# Patient Record
Sex: Female | Born: 1980
Health system: Southern US, Community
[De-identification: ages and names within clinical notes are randomized; demographics above are authoritative.]

## PROBLEM LIST (undated history)

## (undated) DIAGNOSIS — D649 Anemia, unspecified: Secondary | ICD-10-CM

## (undated) DIAGNOSIS — C801 Malignant (primary) neoplasm, unspecified: Secondary | ICD-10-CM

---

## 2007-07-19 ENCOUNTER — Emergency Department (HOSPITAL_COMMUNITY): Admission: EM | Admit: 2007-07-19 | Discharge: 2007-07-19 | Payer: Self-pay | Admitting: Emergency Medicine

## 2007-09-02 ENCOUNTER — Emergency Department (HOSPITAL_COMMUNITY): Admission: EM | Admit: 2007-09-02 | Discharge: 2007-09-03 | Payer: Self-pay | Admitting: Emergency Medicine

## 2016-12-16 ENCOUNTER — Encounter (HOSPITAL_COMMUNITY): Payer: Self-pay | Admitting: Emergency Medicine

## 2016-12-16 ENCOUNTER — Emergency Department (HOSPITAL_COMMUNITY)
Admission: EM | Admit: 2016-12-16 | Discharge: 2016-12-16 | Disposition: A | Discharge status: Home / Self Care | Payer: Self-pay | Attending: Emergency Medicine | Admitting: Emergency Medicine

## 2016-12-16 DIAGNOSIS — J020 Streptococcal pharyngitis: Secondary | ICD-10-CM | POA: Insufficient documentation

## 2016-12-16 LAB — RAPID STREP SCREEN (MED CTR MEBANE ONLY): Streptococcus, Group A Screen (Direct): POSITIVE — AB

## 2016-12-16 MED ORDER — AZITHROMYCIN 250 MG PO TABS: 500.0000 mg | ORAL_TABLET | Freq: Every day | ORAL | 0 refills | Status: AC

## 2016-12-16 MED ORDER — ACETAMINOPHEN 325 MG PO TABS
650.0000 mg | ORAL_TABLET | Freq: Once | ORAL | Status: AC | PRN
  Administered 2016-12-16: 650 mg via ORAL
  Filled 2016-12-16: qty 2

## 2016-12-16 NOTE — ED Triage Notes (Signed)
Patient complains of fever and sore throat x 2 days.

## 2016-12-16 NOTE — ED Provider Notes (Signed)
[facility] DEPT Provider Note   CSN: [id number] Arrival date & time: 12/16/16  1017     History   Chief Complaint Chief Complaint  Patient presents with  . Fever  . Sore Throat    HPI Cassandra Clark is a 36 y.o. female presents to the ED complaining of sore throat for 2 days. She reports Chills and nausea at home. She denies vomiting, diarrhea, cough. She reports trying Tylenol last night and took Tylenol here in the emergency department with moderate relief. She states she also tried Halls with minimal relief. She reports that her son recently was diagnosed with strep throat by culture.  The history is provided by the patient. No language interpreter was used.  Fever   Associated symptoms include sore throat.  Sore Throat     History reviewed. No pertinent past medical history.  There are no active problems to display for this patient.   History reviewed. No pertinent surgical history.  OB History    No data available       Home Medications    Prior to Admission medications   Medication Sig Start Date End Date Taking? Authorizing Provider  azithromycin (ZITHROMAX) 250 MG tablet Take 2 tablets (500 mg total) by mouth daily. 12/16/16 12/21/16  Bettey Costa, [place]    Family History No family history on file.  Social History Social History  Substance Use Topics  . Smoking status: Not on file  . Smokeless tobacco: Never Used  . Alcohol use No     Allergies   Penicillins   Review of Systems Review of Systems  Constitutional: Positive for fever.  HENT: Positive for sore throat.      Physical Exam Updated Vital Signs BP (!) 100/53 (BP Location: Right Arm)   Pulse 110   Temp 99.4 F (37.4 C) (Oral)   Resp 18   Ht 5\' 7"  (1.702 m)   Wt 59 kg   LMP 12/16/2016   SpO2 99%   BMI 20.36 kg/m   Physical Exam  Constitutional: She is oriented to person, place, and time. She appears well-developed and well-nourished.  Well appearing  HENT:    Head: Normocephalic and atraumatic.  Right Ear: External ear normal.  Left Ear: External ear normal.  Nose: Nose normal.  Mouth/Throat: Oropharynx is clear and moist.   Tonsils with evidence of mild redness, swelling, and exudates. TM's appear normal with no evidence of bulging. EAC appear non erythematous and not swollen  Eyes: EOM are normal. Pupils are equal, round, and reactive to light.  Neck: Normal range of motion.  Normal ROM. No nuchal rigidity.   Cardiovascular: Normal rate and normal heart sounds.   Pulmonary/Chest: Effort normal and breath sounds normal. No respiratory distress. She has no wheezes. She has no rales.  Lungs CTA. No wheezing. No rales. No stridor. Normal work of breathing  Abdominal: Soft. There is no tenderness. There is no rebound and no guarding.  Soft and nontender. No rebound. No guarding. Negative murphy's sign. No focal tenderness at McBurney's point. No CVA tenderness. No evidence of hernia. No hepatosplenomegaly.  Lymphadenopathy:    She has no cervical adenopathy.  Neurological: She is alert and oriented to person, place, and time.  Skin: Skin is warm.  Psychiatric: She has a normal mood and affect. Her behavior is normal.  Nursing note and vitals reviewed.    ED Treatments / Results  Labs (all labs ordered are listed, but only abnormal results are displayed) Labs Reviewed  RAPID STREP SCREEN (NOT AT [facility]) - Abnormal; Notable for the following:       Result Value   Streptococcus, Group A Screen (Direct) POSITIVE (*)    All other components within normal limits    EKG  EKG Interpretation None       Radiology No results found.  Procedures Procedures (including critical care time)  Medications Ordered in ED Medications  acetaminophen (TYLENOL) tablet 650 mg (650 mg Oral Given 12/16/16 1104)     Initial Impression / Assessment and Plan / ED Course  I have reviewed the triage vital signs and the nursing notes.  Pertinent labs &  imaging results that were available during my care of the patient were reviewed by me and considered in my medical decision making (see chart for details).    Pt rapid strep test positive. Pt is tolerating secretions well. She is in no respiratory distress. Presentation not concerning for peritonsillar abscess or spread of infection to deep spaces of the throat; patent airway. Pt will be discharged with Azithromycin, since she is reportedly allergic to penicillin.  Specific return precautions discussed. Recommended PCP follow up in one week as needed. Pt appears safe for discharge.    Final Clinical Impressions(s) / ED Diagnoses   Final diagnoses:  Strep pharyngitis    New Prescriptions New Prescriptions   AZITHROMYCIN (ZITHROMAX) 250 MG TABLET    Take 2 tablets (500 mg total) by mouth daily.     [place], [place] 12/16/16 [place], MD 12/18/16 [phone]

## 2016-12-16 NOTE — Discharge Instructions (Signed)
Please take azithromycin 500 mg daily for 5 days. Use warm salt water rinses throughout the day. Wash her hands and drink plenty of fluids throughout the day.  Get help right away if: You have new symptoms, such as vomiting, severe headache, stiff or painful neck, chest pain, or shortness of breath. You have severe throat pain, drooling, or changes in your voice. You have swelling of the neck, or the skin on the neck becomes red and tender. You have signs of dehydration, such as fatigue, dry mouth, and decreased urination. You become increasingly sleepy, or you cannot wake up completely. Your joints become red or painful.

## 2018-07-04 ENCOUNTER — Emergency Department (HOSPITAL_COMMUNITY): Payer: Self-pay

## 2018-07-04 ENCOUNTER — Encounter (HOSPITAL_COMMUNITY): Payer: Self-pay

## 2018-07-04 ENCOUNTER — Emergency Department (HOSPITAL_COMMUNITY)
Admission: EM | Admit: 2018-07-04 | Discharge: 2018-07-04 | Disposition: A | Discharge status: Home / Self Care | Payer: Self-pay | Attending: Emergency Medicine | Admitting: Emergency Medicine

## 2018-07-04 DIAGNOSIS — G8929 Other chronic pain: Secondary | ICD-10-CM

## 2018-07-04 DIAGNOSIS — D649 Anemia, unspecified: Secondary | ICD-10-CM

## 2018-07-04 DIAGNOSIS — R51 Headache: Secondary | ICD-10-CM | POA: Insufficient documentation

## 2018-07-04 DIAGNOSIS — Z79899 Other long term (current) drug therapy: Secondary | ICD-10-CM | POA: Insufficient documentation

## 2018-07-04 LAB — BASIC METABOLIC PANEL
Anion gap: 7 (ref 5–15)
BUN: 12 mg/dL (ref 6–20)
CHLORIDE: 103 mmol/L (ref 98–111)
CO2: 27 mmol/L (ref 22–32)
Calcium: 8.5 mg/dL — ABNORMAL LOW (ref 8.9–10.3)
Creatinine, Ser: 0.81 mg/dL (ref 0.44–1.00)
GFR calc Af Amer: >60 mL/min (ref >60)
GFR calc non Af Amer: >60 mL/min (ref >60)
GLUCOSE: 120 mg/dL — AB (ref 70–99)
Potassium: 3.6 mmol/L (ref 3.5–5.1)
SODIUM: 137 mmol/L (ref 135–145)

## 2018-07-04 LAB — CBC WITH DIFFERENTIAL/PLATELET
Basophils Absolute: 0 10*3/uL (ref 0.0–0.1)
Basophils Relative: 0 %
EOS ABS: 0.1 10*3/uL (ref 0.0–0.7)
Eosinophils Relative: 1 %
HEMATOCRIT: 32.3 % — AB (ref 36.0–46.0)
HEMOGLOBIN: 9.8 g/dL — AB (ref 12.0–15.0)
LYMPHS ABS: 0.6 10*3/uL — AB (ref 0.7–4.0)
LYMPHS PCT: 13 %
MCH: 23.8 pg — AB (ref 26.0–34.0)
MCHC: 30.3 g/dL (ref 30.0–36.0)
MCV: 78.4 fL (ref 78.0–100.0)
MONOS PCT: 8 %
Monocytes Absolute: 0.3 10*3/uL (ref 0.1–1.0)
NEUTROS PCT: 78 %
Neutro Abs: 3.6 10*3/uL (ref 1.7–7.7)
Platelets: 433 10*3/uL — ABNORMAL HIGH (ref 150–400)
RBC: 4.12 MIL/uL (ref 3.87–5.11)
RDW: 15.6 % — ABNORMAL HIGH (ref 11.5–15.5)
WBC: 4.6 10*3/uL (ref 4.0–10.5)

## 2018-07-04 LAB — PREGNANCY, URINE: PREG TEST UR: NEGATIVE

## 2018-07-04 MED ORDER — FERROUS SULFATE 325 (65 FE) MG PO TABS: 325.0000 mg | ORAL_TABLET | Freq: Every day | ORAL | 0 refills | Status: DC

## 2018-07-04 NOTE — Discharge Instructions (Signed)
Follow-up with a primary care doctor. °

## 2018-07-04 NOTE — ED Triage Notes (Signed)
Pt reports HA on left side for several months intermittently, now daily for 2 weeks Pain feels like pressure . Reports nausea and recent cough

## 2018-07-04 NOTE — ED Provider Notes (Signed)
[facility] EMERGENCY DEPARTMENT Provider Note   CSN: [id number] Arrival date & time: 07/04/18  1505     History   Chief Complaint Chief Complaint  Patient presents with  . Headache    HPI Cassandra Clark is a 37 y.o. female.  HPI Patient presents with headache.  Has had for the last 2 to 3 months.  Dull.  Sometimes on the left side but has been on different parts of the head to.  Throbbing.  Comes and goes.  Has had nausea.  Occasional cough.  States she sometimes will get chills.  Motrin tends to help the headache. Also feels lightheaded at times.  Had a history of anemia when she was a kid.  Denies heavy periods.  Does not think she is pregnant. No abdominal pain.  Has had some nausea.  No weight change History reviewed. No pertinent past medical history.  There are no active problems to display for this patient.   History reviewed. No pertinent surgical history.   OB History   None      Home Medications    Prior to Admission medications   Medication Sig Start Date End Date Taking? Authorizing Provider  ibuprofen (ADVIL,MOTRIN) 200 MG tablet Take 800 mg by mouth every 6 (six) hours as needed for moderate pain.   Yes [provider]  naproxen sodium (ALEVE) 220 MG tablet Take 220 mg by mouth 2 (two) times daily as needed (pain).   Yes [provider]  ferrous sulfate 325 (65 FE) MG tablet Take 1 tablet (325 mg total) by mouth daily. 07/04/18   Davonna Belling, MD    Family History No family history on file.  Social History Social History   Tobacco Use  . Smoking status: Never Smoker  . Smokeless tobacco: Never Used  Substance Use Topics  . Alcohol use: No  . Drug use: No     Allergies   Penicillins   Review of Systems Review of Systems  Constitutional: Positive for chills. Negative for appetite change.  HENT: Negative for congestion.   Respiratory: Negative for shortness of breath.   Cardiovascular: Negative for chest pain.    Gastrointestinal: Negative for abdominal pain.  Genitourinary: Negative for flank pain.  Musculoskeletal: Negative for back pain.  Skin: Negative for rash.  Neurological: Positive for light-headedness and headaches.  Psychiatric/Behavioral: Negative for confusion.     Physical Exam Updated Vital Signs BP 113/64   Pulse (!) 115   Temp 99.8 F (37.7 C) (Oral)   Resp 14   Ht 5\' 7"  (1.702 m)   Wt 72.6 kg   LMP 06/27/2018   SpO2 100%   BMI 25.06 kg/m   Physical Exam  Constitutional: She appears well-developed.  HENT:  Head: Normocephalic.  Eyes: EOM are normal.  Neck: Neck supple.  Cardiovascular:  Tachycardia   Abdominal: Soft. There is no tenderness.  Musculoskeletal: Normal range of motion.  Neurological: She is alert.  Skin: Skin is dry.     ED Treatments / Results  Labs (all labs ordered are listed, but only abnormal results are displayed) Labs Reviewed  CBC WITH DIFFERENTIAL/PLATELET - Abnormal; Notable for the following components:      Result Value   Hemoglobin 9.8 (*)    HCT 32.3 (*)    MCH 23.8 (*)    RDW 15.6 (*)    Platelets 433 (*)    Lymphs Abs 0.6 (*)    All other components within normal limits  BASIC METABOLIC PANEL -  Abnormal; Notable for the following components:   Glucose, Bld 120 (*)    Calcium 8.5 (*)    All other components within normal limits  PREGNANCY, URINE    EKG EKG Interpretation  Date/Time:  Sunday July 04 2018 15:59:58 EDT Ventricular Rate:  119 PR Interval:    QRS Duration: 64 QT Interval:  276 QTC Calculation: 389 R Axis:   74 Text Interpretation:  Sinus tachycardia Confirmed by Gillermo Poch ([id number]) on 07/04/2018 4:19:54 PM   Radiology Ct Head Wo Contrast  Result Date: 07/04/2018 CLINICAL DATA:  Left-sided headache. EXAM: CT HEAD WITHOUT CONTRAST TECHNIQUE: Contiguous axial images were obtained from the base of the skull through the vertex without intravenous contrast. COMPARISON:  None. FINDINGS: Brain:  No evidence of acute infarction, hemorrhage, hydrocephalus, extra-axial collection or mass lesion/mass effect. Vascular: No hyperdense vessel or unexpected calcification. Skull: Normal. Negative for fracture or focal lesion. Sinuses/Orbits: No acute finding. Other: None. IMPRESSION: No acute intracranial abnormality. Electronically Signed   By: Dobrinka  Dimitrova M.D.   On: 07/04/2018 17:05    Procedures Procedures (including critical care time)  Medications Ordered in ED Medications - No data to display   Initial Impression / Assessment and Plan / ED Course  I have reviewed the triage vital signs and the nursing notes.  Pertinent labs & imaging results that were available during my care of the patient were reviewed by me and considered in my medical decision making (see chart for details).    Patient with headache.  Does not typically get headaches.  CT reassuring.  Also some mild anemia.  Will start on iron and will need outpatient follow-up.  Mild tachycardia but EKG reassuring Final Clinical Impressions(s) / ED Diagnoses   Final diagnoses:  Chronic nonintractable headache, unspecified headache type  Anemia, unspecified type    ED Discharge Orders         Ordered    ferrous sulfate 325 (65 FE) MG tablet  Daily     09 /08/19 1801           Davonna Belling, MD 07/04/18 2321

## 2018-08-03 ENCOUNTER — Inpatient Hospital Stay (HOSPITAL_COMMUNITY)
Admission: EM | Admit: 2018-08-03 | Discharge: 2018-08-06 | DRG: 841 | Disposition: A | Discharge status: Home / Self Care | Payer: Medicaid Other | Attending: Internal Medicine | Admitting: Internal Medicine

## 2018-08-03 ENCOUNTER — Encounter (HOSPITAL_COMMUNITY): Payer: Self-pay | Admitting: *Deleted

## 2018-08-03 ENCOUNTER — Other Ambulatory Visit: Payer: Self-pay

## 2018-08-03 ENCOUNTER — Emergency Department (HOSPITAL_COMMUNITY): Payer: Medicaid Other

## 2018-08-03 DIAGNOSIS — R5383 Other fatigue: Secondary | ICD-10-CM

## 2018-08-03 DIAGNOSIS — D473 Essential (hemorrhagic) thrombocythemia: Secondary | ICD-10-CM | POA: Diagnosis present

## 2018-08-03 DIAGNOSIS — R591 Generalized enlarged lymph nodes: Secondary | ICD-10-CM

## 2018-08-03 DIAGNOSIS — D649 Anemia, unspecified: Secondary | ICD-10-CM | POA: Diagnosis present

## 2018-08-03 DIAGNOSIS — E538 Deficiency of other specified B group vitamins: Secondary | ICD-10-CM

## 2018-08-03 DIAGNOSIS — J9859 Other diseases of mediastinum, not elsewhere classified: Secondary | ICD-10-CM

## 2018-08-03 DIAGNOSIS — C8190 Hodgkin lymphoma, unspecified, unspecified site: Principal | ICD-10-CM | POA: Diagnosis present

## 2018-08-03 DIAGNOSIS — R222 Localized swelling, mass and lump, trunk: Secondary | ICD-10-CM | POA: Diagnosis present

## 2018-08-03 DIAGNOSIS — C8198 Hodgkin lymphoma, unspecified, lymph nodes of multiple sites: Secondary | ICD-10-CM

## 2018-08-03 DIAGNOSIS — D72819 Decreased white blood cell count, unspecified: Secondary | ICD-10-CM | POA: Diagnosis present

## 2018-08-03 DIAGNOSIS — C8598 Non-Hodgkin lymphoma, unspecified, lymph nodes of multiple sites: Secondary | ICD-10-CM

## 2018-08-03 DIAGNOSIS — D630 Anemia in neoplastic disease: Secondary | ICD-10-CM | POA: Diagnosis present

## 2018-08-03 DIAGNOSIS — R739 Hyperglycemia, unspecified: Secondary | ICD-10-CM

## 2018-08-03 DIAGNOSIS — I871 Compression of vein: Secondary | ICD-10-CM | POA: Diagnosis present

## 2018-08-03 DIAGNOSIS — D279 Benign neoplasm of unspecified ovary: Secondary | ICD-10-CM | POA: Diagnosis present

## 2018-08-03 DIAGNOSIS — Z880 Allergy status to penicillin: Secondary | ICD-10-CM

## 2018-08-03 HISTORY — DX: Anemia, unspecified: D64.9

## 2018-08-03 LAB — CBC WITH DIFFERENTIAL/PLATELET
Abs Immature Granulocytes: 0.01 10*3/uL (ref 0.00–0.07)
Basophils Absolute: 0 10*3/uL (ref 0.0–0.1)
Basophils Relative: 0 %
EOS ABS: 0 10*3/uL (ref 0.0–0.5)
EOS PCT: 1 %
HEMATOCRIT: 29.3 % — AB (ref 36.0–46.0)
Hemoglobin: 8.6 g/dL — ABNORMAL LOW (ref 12.0–15.0)
Immature Granulocytes: 0 %
LYMPHS ABS: 0.4 10*3/uL — AB (ref 0.7–4.0)
Lymphocytes Relative: 11 %
MCH: 22.8 pg — AB (ref 26.0–34.0)
MCHC: 29.4 g/dL — AB (ref 30.0–36.0)
MCV: 77.7 fL — AB (ref 80.0–100.0)
MONO ABS: 0.6 10*3/uL (ref 0.1–1.0)
MONOS PCT: 18 %
Neutro Abs: 2.5 10*3/uL (ref 1.7–7.7)
Neutrophils Relative %: 70 %
Platelets: 451 10*3/uL — ABNORMAL HIGH (ref 150–400)
RBC: 3.77 MIL/uL — ABNORMAL LOW (ref 3.87–5.11)
RDW: 17.9 % — ABNORMAL HIGH (ref 11.5–15.5)
WBC: 3.6 10*3/uL — ABNORMAL LOW (ref 4.0–10.5)
nRBC: 0 % (ref 0.0–0.2)

## 2018-08-03 LAB — URINALYSIS, ROUTINE W REFLEX MICROSCOPIC
Bilirubin Urine: NEGATIVE
Glucose, UA: NEGATIVE mg/dL
KETONES UR: NEGATIVE mg/dL
Nitrite: NEGATIVE
PROTEIN: NEGATIVE mg/dL
Specific Gravity, Urine: 1.02 (ref 1.005–1.030)
pH: 7 (ref 5.0–8.0)

## 2018-08-03 LAB — POC URINE PREG, ED: PREG TEST UR: NEGATIVE

## 2018-08-03 LAB — INFLUENZA PANEL BY PCR (TYPE A & B)
INFLAPCR: NEGATIVE
Influenza B By PCR: NEGATIVE

## 2018-08-03 MED ORDER — ACETAMINOPHEN 325 MG PO TABS
650.0000 mg | ORAL_TABLET | Freq: Once | ORAL | Status: AC | PRN
  Administered 2018-08-03: 650 mg via ORAL
  Filled 2018-08-03: qty 2

## 2018-08-03 NOTE — ED Notes (Signed)
Pt returned from xray,  

## 2018-08-03 NOTE — ED Notes (Signed)
ED Provider at bedside. 

## 2018-08-03 NOTE — ED Provider Notes (Signed)
[facility] EMERGENCY DEPARTMENT Provider Note   CSN: [id number] Arrival date & time: 08/03/18  2131     History   Chief Complaint Chief Complaint  Patient presents with  . Fever    HPI Cassandra Clark is a 37 y.o. female with a recent several month history of chronic daily headache but now resolved x 3 days and a recent diagnosis of anemia, on iron supplementation presenting with a fever to 102 since yesterday although she reports nightly night sweats for the past several weeks so suspects she has had subjective fevers for weeks.  Also with a mild non productive cough. She denies current headache as mentioned, also no neck pain or stiffness, no sob, cp, abdominal pain, n/v/d, no dysuria or vaginal discharge.  Also denies rash or insect bites.  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  She has had no treatment prior to arrival.   The history is provided by the patient.    Past Medical History:  Diagnosis Date  . Anemia     There are no active problems to display for this patient.   History reviewed. No pertinent surgical history.   OB History   None      Home Medications    Prior to Admission medications   Medication Sig Start Date End Date Taking? Authorizing Provider  ibuprofen (ADVIL,MOTRIN) 200 MG tablet Take 800 mg by mouth every 6 (six) hours as needed for moderate pain.   Yes [provider]  naproxen sodium (ALEVE) 220 MG tablet Take 220 mg by mouth 2 (two) times daily as needed (pain).   Yes [provider]  ferrous sulfate 325 (65 FE) MG tablet Take 1 tablet (325 mg total) by mouth daily. Patient not taking: Reported on 08/03/2018 07/04/18   Davonna Belling, MD    Family History No family history on file.  Social History Social History   Tobacco Use  . Smoking status: Never Smoker  . Smokeless tobacco: Never Used  Substance Use Topics  . Alcohol use: No  . Drug use: No     Allergies    Penicillins   Review of Systems Review of Systems  Constitutional: Positive for fever.  HENT: Negative for congestion, sinus pressure and sore throat.   Eyes: Negative.   Respiratory: Positive for cough. Negative for chest tightness and shortness of breath.   Cardiovascular: Negative for chest pain.  Gastrointestinal: Negative for abdominal pain, constipation, nausea and vomiting.  Genitourinary: Negative.  Negative for dysuria and vaginal discharge.  Musculoskeletal: Negative for arthralgias, joint swelling and neck pain.  Skin: Negative.  Negative for rash and wound.  Neurological: Negative for dizziness, weakness, light-headedness and numbness.  Psychiatric/Behavioral: Negative.      Physical Exam Updated Vital Signs BP 114/76   Pulse 100   Temp 99.1 F (37.3 C) (Oral)   Resp 20   Ht 5\' 7"  (1.702 m)   Wt 72.6 kg   LMP 07/26/2018   SpO2 100%   BMI 25.06 kg/m   Physical Exam  Constitutional: She appears well-developed and well-nourished.  HENT:  Head: Normocephalic and atraumatic.  Eyes: Conjunctivae are normal.  Neck: Normal range of motion. No neck rigidity.  Cardiovascular: Normal rate, regular rhythm, normal heart sounds and intact distal pulses.  Pulmonary/Chest: Effort normal and breath sounds normal. She has no wheezes.  Abdominal: Soft. Bowel sounds are normal. There is no tenderness.  Musculoskeletal: Normal range of motion.  Lymphadenopathy:  She has no cervical adenopathy.  Neurological: She is alert.  Skin: Skin is warm and dry. No rash noted.  Psychiatric: She has a normal mood and affect.  Nursing note and vitals reviewed.    ED Treatments / Results  Labs (all labs ordered are listed, but only abnormal results are displayed) Labs Reviewed  URINALYSIS, ROUTINE W REFLEX MICROSCOPIC - Abnormal; Notable for the following components:      Result Value   APPearance HAZY (*)    Hgb urine dipstick SMALL (*)    Leukocytes, UA TRACE (*)     Bacteria, UA RARE (*)    All other components within normal limits  CBC WITH DIFFERENTIAL/PLATELET - Abnormal; Notable for the following components:   WBC 3.6 (*)    RBC 3.77 (*)    Hemoglobin 8.6 (*)    HCT 29.3 (*)    MCV 77.7 (*)    MCH 22.8 (*)    MCHC 29.4 (*)    RDW 17.9 (*)    Platelets 451 (*)    Lymphs Abs 0.4 (*)    All other components within normal limits  COMPREHENSIVE METABOLIC PANEL - Abnormal; Notable for the following components:   Glucose, Bld 107 (*)    Calcium 8.4 (*)    Albumin 3.2 (*)    All other components within normal limits  INFLUENZA PANEL BY PCR (TYPE A & B)  POC URINE PREG, ED    EKG None  Radiology Dg Chest 2 View  Result Date: 08/03/2018 CLINICAL DATA:  Fever with cough for 1 week. Nausea and vomiting. EXAM: CHEST - 2 VIEW COMPARISON:  None. FINDINGS: Normal heart size and pulmonary vascularity. Right paratracheal and left AP window mediastinal masses likely representing prominent lymphadenopathy. Consider lymphoma. CT chest suggested for further characterization. Lungs are clear and expanded. No blunting of costophrenic angles. No pneumothorax. IMPRESSION: Right paratracheal and left AP window masses likely representing lymphadenopathy. Consider CT chest for further characterization. Electronically Signed   By: Lucienne Capers M.D.   On: 08/03/2018 23:21   Ct Chest W Contrast  Result Date: 08/04/2018 CLINICAL DATA:  Mediastinal mass on chest radiography, for further characterization. EXAM: CT CHEST WITH CONTRAST TECHNIQUE: Multidetector CT imaging of the chest was performed during intravenous contrast administration. CONTRAST:  24mL OMNIPAQUE IOHEXOL 300 MG/ML  SOLN COMPARISON:  08/03/2018 FINDINGS: Cardiovascular: There is considerable extrinsic mass effect on the superior vena cava by the mediastinal mass, lowering the cross-sectional area to about 0.55 cm^2 on image 32/2, with some irregularity along the caval margin which could conceivably  represent invasion of the cava or a small amount of marginal thrombus. No large acute pulmonary embolus although today's exam was not timed or performed to assess specifically for pulmonary embolus. There is narrowing of the left upper lobe pulmonary artery due to the mass. Mediastinum/Nodes: Confluent and irregular mediastinal mass also extending into the left hilum and questionably involving the periphery of the left lung/pleural margin. A representative measurement on image 48/2 measures 10.3 by 6.0 cm. The mass extends around much of the aortic arch and portions the branch vasculature and abuts the left pulmonary artery. Extension in the anterior mediastinum has mass effect on the SVC is noted above. Both internal mammary arteries course through this tumor. The tumor involves the AP window. Left infrahilar node 1.2 cm in short axis on image 61/2. Pathologic supraclavicular adenopathy including a 1.2 cm node on image 9/2. A lower neck level IV lymph node measures 1.4 cm in  short axis on image 1/2. Right supraclavicular node 1.0 cm in short axis, image 9/2. Pathologic left axillary and subpectoral adenopathy, index axillary node 2.2 cm in short axis on image 18/2. An upper mediastinal node below the thyroid gland measures 1.9 cm in short axis on image 16/2. Lungs/Pleura: Tumor extends around the left upper lobe tracheobronchial tree centrally. A lung nodule in the left upper lobe medially measures 1.0 by 1.1 cm on image 38/4. Tumor tracks along the pleural space below the left first rib as shown on image 22/2, measuring about 1.7 cm anterior-posterior. Upper Abdomen: Nodular heterogeneous enhancement in the spleen favoring tumor involvement. An index lesion measures 2.5 by 1.9 cm on image 122/2, but multiple splenic nodules are present. There is likely a lymph node in the splenic hilum on image 133/2, the bottom most image. Porta hepatis adenopathy identified including a 1.3 cm peripancreatic node on image 132/2  and a portacaval node measuring 1.5 cm in short axis on image 133/2. A lymph node between the right hemidiaphragmatic crus and the IVC measures 1.2 cm in short axis also on image 132. Hypodense lesion medially in the right hepatic lobe on image 127/2 measures 1.6 by 1.3 cm. Questionable hypodense lesion peripherally in the right hepatic lobe about 0.9 cm in diameter on image 116/2. Musculoskeletal: Permeative destruction of the sternal manubrium, with tumor and/or hematoma along the anterior and posterior manubrial margins. The tumor rind anterior to the manubrium measures up to about 1.4 cm in thickness on image 97/6. IMPRESSION: 1. 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 In addition there are multiple nodular lesions in the spleen and possibly in the liver, as well as permeative destruction of the sternal manubrium with tumor rind anterior and posterior to the sternum. 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 Left-sided small cell lung cancer might give a similar appearance. Aggressive germ-cell tumor or thymic carcinoma or less likely differential diagnostic considerations. Tissue diagnosis recommended. 2. The anterior mediastinal mass is causing narrowing of the SVC, and mild invasion of the SVC with slight marginal thrombosis is not readily excluded. No large pulmonary embolus is identified. Electronically Signed   By: Van Clines M.D.   On: 08/04/2018 01:22    Procedures Procedures (including critical care time)  Medications Ordered in ED Medications  acetaminophen (TYLENOL) tablet 650 mg (650 mg Oral Given 08/03/18 2156)  sodium chloride 0.9 % bolus 1,000 mL (0 mLs Intravenous Stopped 08/04/18 0202)  iohexol (OMNIPAQUE) 300 MG/ML solution 75 mL (75 mLs Intravenous Contrast Given 08/04/18 0043)     Initial Impression /  Assessment and Plan / ED Course  I have reviewed the triage vital signs and the nursing notes.  Pertinent labs & imaging results that were available during my care of the patient were reviewed by me and considered in my medical decision making (see chart for details).     Imaging and labs reviewed and discussed with patient and was also seen by Dr. Tomi Bamberger. Call placed to Dr. Delton Coombes, awaiting call back to determine need for admission tonight or close outpt f/u.  Suspect pt will need urgent radiation for debulking purposes given the compromise with the SVC.  Call also placed with the hospitalist group.  Dr. Tomi Bamberger will arrange pt's dispo.  Final Clinical Impressions(s) / ED Diagnoses   Final diagnoses:  Mediastinal mass  Lymphadenopathy    ED Discharge  Orders    None       Evalee Jefferson, PA-C 08/04/18 5041    Rolland Porter, MD 08/04/18 [phone]

## 2018-08-03 NOTE — ED Triage Notes (Addendum)
Pt c/o fever, nausea, cough that started a few weeks ago, pt reports that she was seen in the er on 07/04/2018 and was told that she had migraines and anemia. Pt reports that she has had episodes of sweating after taking ibuprofen for the headaches and thought that is might be related to a fever, denies any headaches for the past 3 days but reported a fever of 102.9 at work last night. Pt fever 102.4 in triage, denies any tylenol or ibuprofen use in the past 4 days,

## 2018-08-03 NOTE — ED Notes (Signed)
Pt and family updated on plan of care,  

## 2018-08-04 ENCOUNTER — Ambulatory Visit
Admit: 2018-08-04 | Discharge: 2018-08-04 | Disposition: A | Discharge status: Home / Self Care | Payer: Self-pay | Attending: Radiation Oncology | Admitting: Radiation Oncology

## 2018-08-04 ENCOUNTER — Inpatient Hospital Stay (HOSPITAL_COMMUNITY): Payer: Medicaid Other

## 2018-08-04 ENCOUNTER — Encounter (HOSPITAL_COMMUNITY): Payer: Self-pay | Admitting: Internal Medicine

## 2018-08-04 ENCOUNTER — Emergency Department (HOSPITAL_COMMUNITY): Payer: Medicaid Other

## 2018-08-04 DIAGNOSIS — E538 Deficiency of other specified B group vitamins: Secondary | ICD-10-CM | POA: Diagnosis present

## 2018-08-04 DIAGNOSIS — R591 Generalized enlarged lymph nodes: Secondary | ICD-10-CM | POA: Diagnosis present

## 2018-08-04 DIAGNOSIS — R222 Localized swelling, mass and lump, trunk: Secondary | ICD-10-CM

## 2018-08-04 DIAGNOSIS — I871 Compression of vein: Secondary | ICD-10-CM

## 2018-08-04 DIAGNOSIS — R739 Hyperglycemia, unspecified: Secondary | ICD-10-CM | POA: Diagnosis present

## 2018-08-04 DIAGNOSIS — C8190 Hodgkin lymphoma, unspecified, unspecified site: Secondary | ICD-10-CM | POA: Diagnosis present

## 2018-08-04 DIAGNOSIS — D630 Anemia in neoplastic disease: Secondary | ICD-10-CM | POA: Diagnosis present

## 2018-08-04 DIAGNOSIS — K7689 Other specified diseases of liver: Secondary | ICD-10-CM

## 2018-08-04 DIAGNOSIS — D72819 Decreased white blood cell count, unspecified: Secondary | ICD-10-CM | POA: Diagnosis present

## 2018-08-04 DIAGNOSIS — J9859 Other diseases of mediastinum, not elsewhere classified: Secondary | ICD-10-CM

## 2018-08-04 DIAGNOSIS — C8598 Non-Hodgkin lymphoma, unspecified, lymph nodes of multiple sites: Secondary | ICD-10-CM

## 2018-08-04 DIAGNOSIS — D739 Disease of spleen, unspecified: Secondary | ICD-10-CM

## 2018-08-04 DIAGNOSIS — Z880 Allergy status to penicillin: Secondary | ICD-10-CM | POA: Diagnosis not present

## 2018-08-04 DIAGNOSIS — D649 Anemia, unspecified: Secondary | ICD-10-CM | POA: Diagnosis present

## 2018-08-04 DIAGNOSIS — D279 Benign neoplasm of unspecified ovary: Secondary | ICD-10-CM | POA: Diagnosis present

## 2018-08-04 DIAGNOSIS — D473 Essential (hemorrhagic) thrombocythemia: Secondary | ICD-10-CM | POA: Diagnosis present

## 2018-08-04 LAB — COMPREHENSIVE METABOLIC PANEL
ALK PHOS: 61 U/L (ref 38–126)
ALT: 13 U/L (ref 0–44)
ALT: 11 U/L (ref 0–44)
ANION GAP: 8 (ref 5–15)
ANION GAP: 8 (ref 5–15)
AST: 17 U/L (ref 15–41)
AST: 15 U/L (ref 15–41)
Albumin: 3.2 g/dL — ABNORMAL LOW (ref 3.5–5.0)
Albumin: 2.7 g/dL — ABNORMAL LOW (ref 3.5–5.0)
Alkaline Phosphatase: 51 U/L (ref 38–126)
BILIRUBIN TOTAL: 0.6 mg/dL (ref 0.3–1.2)
BUN: 9 mg/dL (ref 6–20)
BUN: 8 mg/dL (ref 6–20)
CALCIUM: 8.4 mg/dL — AB (ref 8.9–10.3)
CO2: 25 mmol/L (ref 22–32)
CO2: 26 mmol/L (ref 22–32)
CREATININE: 0.69 mg/dL (ref 0.44–1.00)
Calcium: 8 mg/dL — ABNORMAL LOW (ref 8.9–10.3)
Chloride: 103 mmol/L (ref 98–111)
Chloride: 106 mmol/L (ref 98–111)
Creatinine, Ser: 0.81 mg/dL (ref 0.44–1.00)
GLUCOSE: 95 mg/dL (ref 70–99)
Glucose, Bld: 107 mg/dL — ABNORMAL HIGH (ref 70–99)
Potassium: 3.7 mmol/L (ref 3.5–5.1)
Potassium: 3.5 mmol/L (ref 3.5–5.1)
Sodium: 136 mmol/L (ref 135–145)
Sodium: 140 mmol/L (ref 135–145)
TOTAL PROTEIN: 7.5 g/dL (ref 6.5–8.1)
TOTAL PROTEIN: 6.8 g/dL (ref 6.5–8.1)
Total Bilirubin: 0.6 mg/dL (ref 0.3–1.2)

## 2018-08-04 LAB — PHOSPHORUS
PHOSPHORUS: 2 mg/dL — AB (ref 2.5–4.6)
Phosphorus: 2.7 mg/dL (ref 2.5–4.6)

## 2018-08-04 LAB — CBC WITH DIFFERENTIAL/PLATELET
ABS IMMATURE GRANULOCYTES: 0.01 10*3/uL (ref 0.00–0.07)
BASOS ABS: 0 10*3/uL (ref 0.0–0.1)
BASOS PCT: 0 %
EOS ABS: 0 10*3/uL (ref 0.0–0.5)
Eosinophils Relative: 1 %
HCT: 28.7 % — ABNORMAL LOW (ref 36.0–46.0)
Hemoglobin: 8.3 g/dL — ABNORMAL LOW (ref 12.0–15.0)
Immature Granulocytes: 0 %
Lymphocytes Relative: 12 %
Lymphs Abs: 0.3 10*3/uL — ABNORMAL LOW (ref 0.7–4.0)
MCH: 22.8 pg — ABNORMAL LOW (ref 26.0–34.0)
MCHC: 28.9 g/dL — ABNORMAL LOW (ref 30.0–36.0)
MCV: 78.8 fL — AB (ref 80.0–100.0)
MONOS PCT: 22 %
Monocytes Absolute: 0.6 10*3/uL (ref 0.1–1.0)
NEUTROS ABS: 1.9 10*3/uL (ref 1.7–7.7)
NRBC: 0 % (ref 0.0–0.2)
Neutrophils Relative %: 65 %
PLATELETS: 459 10*3/uL — AB (ref 150–400)
RBC: 3.64 MIL/uL — ABNORMAL LOW (ref 3.87–5.11)
RDW: 18.1 % — AB (ref 11.5–15.5)
WBC: 3 10*3/uL — ABNORMAL LOW (ref 4.0–10.5)

## 2018-08-04 LAB — MAGNESIUM
Magnesium: 1.9 mg/dL (ref 1.7–2.4)
Magnesium: 2 mg/dL (ref 1.7–2.4)

## 2018-08-04 LAB — PROTIME-INR
INR: 1.34
PROTHROMBIN TIME: 16.4 s — AB (ref 11.4–15.2)

## 2018-08-04 LAB — APTT: APTT: 45 s — AB (ref 24–36)

## 2018-08-04 LAB — IRON AND TIBC
Iron: 23 ug/dL — ABNORMAL LOW (ref 28–170)
SATURATION RATIOS: 12 % (ref 10.4–31.8)
TIBC: 196 ug/dL — AB (ref 250–450)
UIBC: 173 ug/dL

## 2018-08-04 LAB — RETICULOCYTES
Immature Retic Fract: 29.4 % — ABNORMAL HIGH (ref 2.3–15.9)
RBC.: 3.42 MIL/uL — AB (ref 3.87–5.11)
Retic Count, Absolute: 69.8 10*3/uL (ref 19.0–186.0)
Retic Ct Pct: 2 % (ref 0.4–3.1)

## 2018-08-04 LAB — FOLATE: Folate: 11.5 ng/mL (ref >5.9)

## 2018-08-04 LAB — URIC ACID: URIC ACID, SERUM: 4.5 mg/dL (ref 2.5–7.1)

## 2018-08-04 LAB — VITAMIN B12: VITAMIN B 12: 135 pg/mL — AB (ref 180–914)

## 2018-08-04 LAB — LACTATE DEHYDROGENASE: LDH: 203 U/L — ABNORMAL HIGH (ref 98–192)

## 2018-08-04 LAB — FERRITIN: Ferritin: 91 ng/mL (ref 11–307)

## 2018-08-04 MED ORDER — LIDOCAINE-EPINEPHRINE (PF) 2 %-1:200000 IJ SOLN
INTRAMUSCULAR | Status: AC
  Filled 2018-08-04: qty 20

## 2018-08-04 MED ORDER — ACETAMINOPHEN 650 MG RE SUPP: 650.0000 mg | Freq: Four times a day (QID) | RECTAL | Status: DC | PRN

## 2018-08-04 MED ORDER — MIDAZOLAM HCL 2 MG/2ML IJ SOLN
INTRAMUSCULAR | Status: AC | PRN
  Administered 2018-08-04 (×2): 1 mg via INTRAVENOUS

## 2018-08-04 MED ORDER — CYANOCOBALAMIN 1000 MCG/ML IJ SOLN
1000.0000 ug | INTRAMUSCULAR | Status: DC
  Administered 2018-08-04 – 2018-08-05 (×2): 1000 ug via INTRAMUSCULAR
  Filled 2018-08-04 (×3): qty 1

## 2018-08-04 MED ORDER — FENTANYL CITRATE (PF) 100 MCG/2ML IJ SOLN
INTRAMUSCULAR | Status: AC
  Filled 2018-08-04: qty 2

## 2018-08-04 MED ORDER — SODIUM CHLORIDE 0.9 % IV BOLUS
1000.0000 mL | Freq: Once | INTRAVENOUS | Status: AC
Start: 2018-08-04 — End: 2018-08-04
  Administered 2018-08-04: 1000 mL via INTRAVENOUS

## 2018-08-04 MED ORDER — FENTANYL CITRATE (PF) 100 MCG/2ML IJ SOLN
INTRAMUSCULAR | Status: AC | PRN
  Administered 2018-08-04 (×2): 50 ug via INTRAVENOUS

## 2018-08-04 MED ORDER — HEPARIN SODIUM (PORCINE) 5000 UNIT/ML IJ SOLN
5000.0000 [IU] | Freq: Three times a day (TID) | INTRAMUSCULAR | Status: DC
  Administered 2018-08-04: 5000 [IU] via SUBCUTANEOUS
  Filled 2018-08-04: qty 1

## 2018-08-04 MED ORDER — SODIUM CHLORIDE 0.9 % IV SOLN
INTRAVENOUS | Status: DC
  Administered 2018-08-04 – 2018-08-06 (×5): via INTRAVENOUS

## 2018-08-04 MED ORDER — MIDAZOLAM HCL 2 MG/2ML IJ SOLN
INTRAMUSCULAR | Status: AC
  Filled 2018-08-04: qty 2

## 2018-08-04 MED ORDER — KETOROLAC TROMETHAMINE 30 MG/ML IJ SOLN: 30.0000 mg | Freq: Once | INTRAMUSCULAR | Status: DC

## 2018-08-04 MED ORDER — HEPARIN SODIUM (PORCINE) 5000 UNIT/ML IJ SOLN
5000.0000 [IU] | Freq: Three times a day (TID) | INTRAMUSCULAR | Status: AC
  Administered 2018-08-05 (×3): 5000 [IU] via SUBCUTANEOUS
  Filled 2018-08-04 (×3): qty 1

## 2018-08-04 MED ORDER — MORPHINE SULFATE (PF) 4 MG/ML IV SOLN: 4.0000 mg | INTRAVENOUS | Status: DC | PRN

## 2018-08-04 MED ORDER — POTASSIUM CHLORIDE IN NACL 20-0.9 MEQ/L-% IV SOLN
INTRAVENOUS | Status: AC
  Administered 2018-08-04: 23:00:00 via INTRAVENOUS
  Filled 2018-08-04: qty 1000

## 2018-08-04 MED ORDER — LIDOCAINE HCL (PF) 1 % IJ SOLN
INTRAMUSCULAR | Status: AC | PRN
  Administered 2018-08-04: 10 mL

## 2018-08-04 MED ORDER — TRAZODONE HCL 50 MG PO TABS: 50.0000 mg | ORAL_TABLET | Freq: Every evening | ORAL | Status: DC | PRN

## 2018-08-04 MED ORDER — PROMETHAZINE HCL 25 MG/ML IJ SOLN: 12.5000 mg | INTRAMUSCULAR | Status: DC | PRN

## 2018-08-04 MED ORDER — IOHEXOL 300 MG/ML  SOLN
75.0000 mL | Freq: Once | INTRAMUSCULAR | Status: AC | PRN
  Administered 2018-08-04: 75 mL via INTRAVENOUS

## 2018-08-04 MED ORDER — ACETAMINOPHEN 325 MG PO TABS
650.0000 mg | ORAL_TABLET | Freq: Four times a day (QID) | ORAL | Status: DC | PRN
  Administered 2018-08-04: 650 mg via ORAL
  Filled 2018-08-04: qty 2

## 2018-08-04 NOTE — Progress Notes (Addendum)
The patient was admitted early this morning after midnight and H&P has been reviewed and I am in current agreement with assessment plan done by Dr. Tennis Must.  Additional changes the plan of care been made accordingly.  Patient is a 37 year old African-American female with past medical history significant for anemia presented to the emergency department with complaints of a new onset fever and 2 weeks history of night sweats, dry cough, fatigue, decreased appetite and unintentional weight loss.  Also has been complaining of having intermittent headaches and she was seen in the ER about a month ago for this.  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  In addition there is also multiple nodular lesions in the spleen and possibly the liver as well as permeative destruction of the sternal manubrium with tumor rind anterior and posterior to the sternum findings were concerning for a aggressive lymphoma versus a left-sided small cell lung cancer or a an aggressive germ cell tumor or thymic carcinoma which are less likely in the differential.  This anterior mediastinal mass was causing narrowing of the SVC with mild elevation of the SVC slight marginal thrombosis not ready excluded and there is no PE identified.  I have spoken with Dr. Mohammed Kindle of cardiothoracic surgery who will review films and make further recommendations for biopsy.  After discussion with Dr. Mohammed Kindle he recommends obtaining an IR consult for supraclavicular node  core needle biopsy and I have placed a consult.  I have also notified Dr. Irene Limbo of Oncology and I am awaiting a callback regarding formal consultation.  Also notify radiation oncology for possible radiotherapy given invasion into the SVC and concern for SVC syndrome.  We will monitor patient's clinical course and repeat  blood work in a.m.

## 2018-08-04 NOTE — Progress Notes (Signed)
MEDICATION-RELATED CONSULT NOTE   IR Procedure Consult - Anticoagulant/Antiplatelet PTA/Inpatient Med List Review by Pharmacist   Procedure: US guided biopsy of dominant left supraclavicular lymph node    Completed: 08/04/18 @ 1615  Post-Procedural bleeding risk per IR MD assessment:  Standard  Antithrombotic medications on inpatient or PTA profile prior to procedure:     Heparin 5000 units Sq Q8h  Recommended restart time per IR Post-Procedure Guidelines:  At least 6 hours or at next standard dosing interval  Other considerations:      Plan:    Resume sq heparin tomorrow at [place], PharmD, BCPS 08/04/2018@4 :31 PM

## 2018-08-04 NOTE — ED Notes (Signed)
Dr Ortiz at bedside,  

## 2018-08-04 NOTE — Progress Notes (Signed)
HEMATOLOGY/ONCOLOGY CONSULTATION NOTE  Date of Service: 08/04/2018  Patient Care Team: Patient, No Pcp Per as PCP - General (General Practice)  CHIEF COMPLAINTS/PURPOSE OF CONSULTATION:  Mediastinal Mass  HISTORY OF PRESENTING ILLNESS:   Cassandra Clark is a wonderful 37 y.o. female who has been referred to [place] by Dr. 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   Patient has a mild anemia and presented to the emergency room with new onset fevers chills and a 2-week history of worsening night sweats dry cough fatigue and anorexia.  Patient notes that she initially started feeling unwell from late June when she had noted increasing fatigue that required her to rest for longer periods of time.  She also noted that she at times felt lightheaded and dizzy when she bent forward.  She notes a decreased appetite.  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 She notes that she has not been able to do much of anything else other than rest at home and work to some degree at her job.  No chest pain no palpitations no diaphoresis no lower extremity edema.  No abdominal pain no nausea no vomiting no diarrhea.  No rectal bleeding. Notes that her periods have been regular.  In the emergency room the patient was noted to have a fever of 102.4 F and tachycardia related to this.  Blood counts showed that the patient was anemic with a hemoglobin around 8 with normal platelets and a WBC count of 3.6k.  Magnesium 1.9 and phosphorus of 2. She was also noted to be vitamin B12 deficient.  Most recent lab results (08/04/18) of CBC w/diff and CMP is as follows: all values are WNL except for WBC at 3.0k RBC at 3.64, HGB at 8.3, HCT at 82.7, MCV at 78.8, MCH at 22.8, MCHC at 28.9, RDW at 18.1, PLT at 459k, Lymphs abs at 300, Calcium at 8.0,  Albumin at 2.7. 08/04/18 LDH at 203 08/04/18 Vitamin B12 at 135  X-ray of the chest done on 08/03/2018 showed Right paratracheal and left AP window masses likely representing lymphadenopathy. Consider CT chest for further characterization.  CT of the chest with contrast was subsequently done which showed 1. 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 In addition there are multiple nodular lesions in the spleen and possibly in the liver, as well as permeative destruction of the sternal manubrium with tumor rind anterior and posterior to the sternum. 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 Left-sided small cell lung cancer might give a similar appearance. Aggressive germ-cell tumor or thymic carcinoma or less likely differential diagnostic considerations. Tissue diagnosis recommended. 2. The anterior mediastinal mass is causing narrowing of the SVC, and mild invasion of the SVC with slight marginal thrombosis is not readily excluded. No large pulmonary embolus is identified.  Patient had additional lab testing which showed a near normal LDH was 203. Sedimentation rate was done subsequently and was noted to be elevated.  Urine pregnancy test was within normal limits.  Patient was noted to have significant headaches over the last 4 to 6 weeks and had a CT of the head without contrast in the ED on 07/04/2018 which showed no acute intracranial normalities.  On review of systems, pt reports fevers, unquantified weight loss, shortness of breath, night sweats and denies overt chest pain , rashes.    MEDICAL HISTORY:  Past Medical History:  Diagnosis Date  . Anemia     SURGICAL HISTORY: History reviewed. No pertinent surgical history.  SOCIAL HISTORY: Social History   Socioeconomic History  . Marital status: Legally  Separated    Spouse name: Not on file  . Number of children: Not on file  . Years of education: Not on file  . Highest education level: Not on file  Occupational History  . Not on file  Social Needs  . Financial resource strain: Not on file  . Food insecurity:    Worry: Not on file    Inability: Not on file  . Transportation needs:    Medical: Not on file    Non-medical: Not on file  Tobacco Use  . Smoking status: Never Smoker  . Smokeless tobacco: Never Used  Substance and Sexual Activity  . Alcohol use: No  . Drug use: No    Comment: Drinks tea  . Sexual activity: Not on file  Lifestyle  . Physical activity:    Days per week: Not on file    Minutes per session: Not on file  . Stress: Not on file  Relationships  . Social connections:    Talks on phone: Not on file    Gets together: Not on file    Attends religious service: Not on file    Active member of club or organization: Not on file    Attends meetings of clubs or organizations: Not on file    Relationship status: Not on file  . Intimate partner violence:    Fear of current or ex partner: Not on file    Emotionally abused: Not on file    Physically abused: Not on file    Forced sexual activity: Not on file  Other Topics Concern  . Not on file  Social History Narrative  . Not on file    FAMILY HISTORY: Family History  Problem Relation Age of Onset  . Diabetes Mellitus II Father   . Hypertension Father   . Congestive Heart Failure Maternal Grandmother   . Diabetes Mellitus II Maternal Grandmother   . Diabetes Mellitus II Maternal Uncle   . Congestive Heart Failure Maternal Uncle     ALLERGIES:  is allergic to penicillins.  MEDICATIONS:  Current Facility-Administered Medications  Medication Dose Route Frequency Provider Last Rate Last Dose  . 0.9 %  sodium chloride infusion   Intravenous Continuous Reubin Milan, MD 100 mL/hr at 08/04/18 0519    . acetaminophen (TYLENOL) tablet 650 mg  650 mg  Oral Q6H PRN Reubin Milan, MD       Or  . acetaminophen (TYLENOL) suppository 650 mg  650 mg Rectal Q6H PRN Reubin Milan, MD      . heparin injection 5,000 Units  5,000 Units Subcutaneous Q8H Reubin Milan, MD   5,000 Units at 08/04/18 0535  . ketorolac (TORADOL) 30 MG/ML injection 30 mg  30 mg Intravenous Once Reubin Milan, MD      . morphine 4 MG/ML injection 4 mg  4 mg Intravenous Q4H PRN Reubin Milan, MD      . promethazine [facility]) injection 12.5 mg  12.5 mg Intravenous Q4H PRN Reubin Milan, MD        REVIEW OF SYSTEMS:    10 Point review of  Systems was done is negative except as noted above.  PHYSICAL EXAMINATION: ECOG PERFORMANCE STATUS: 1 - Symptomatic but completely ambulatory  . Vitals:   08/04/18 0505 08/04/18 0855  BP: 110/64   Pulse: 97   Resp: 16   Temp: 99.6 F (37.6 C)   SpO2: 100% 98%   Filed Weights   08/03/18 2143 08/04/18 0457  Weight: 160 lb (72.6 kg) 158 lb 4.6 oz (71.8 kg)   .Body mass index is 24.79 kg/m.  GENERAL:alert, in no acute distress and comfortable SKIN: no acute rashes, no significant lesions EYES: conjunctiva are pink and non-injected, sclera anicteric OROPHARYNX: MMM, no exudates, no oropharyngeal erythema or ulceration NECK: supple, no JVD LYMPH:  B/l supraclavicular and left axillary lymphadenopathy palpable. LUNGS: clear to auscultation b/l with normal respiratory effort HEART: regular rate & rhythm ABDOMEN:  normoactive bowel sounds , non tender, not distended. Extremity: trace pedal edema PSYCH: alert & oriented x 3 with fluent speech NEURO: no focal motor/sensory deficits  LABORATORY DATA:  I have reviewed the data as listed  . CBC Latest Ref Rng & Units 08/04/2018 08/03/2018 07/04/2018  WBC 4.0 - 10.5 K/uL 3.0(L) 3.6(L) 4.6  Hemoglobin 12.0 - 15.0 g/dL 8.3(L) 8.6(L) 9.8(L)  Hematocrit 36.0 - 46.0 % 28.7(L) 29.3(L) 32.3(L)  Platelets 150 - 400 K/uL 459(H) 451(H) 433(H)    . CMP  Latest Ref Rng & Units 08/04/2018 08/03/2018 07/04/2018  Glucose 70 - 99 mg/dL 95 107(H) 120(H)  BUN 6 - 20 mg/dL 8 9 12   Creatinine 0.44 - 1.00 mg/dL 0.69 0.81 0.81  Sodium 135 - 145 mmol/L 140 136 137  Potassium 3.5 - 5.1 mmol/L 3.5 3.7 3.6  Chloride 98 - 111 mmol/L 106 103 103  CO2 22 - 32 mmol/L 26 25 27   Calcium 8.9 - 10.3 mg/dL 8.0(L) 8.4(L) 8.5(L)  Total Protein 6.5 - 8.1 g/dL 6.8 7.5 -  Total Bilirubin 0.3 - 1.2 mg/dL 0.6 0.6 -  Alkaline Phos 38 - 126 U/L 51 61 -  AST 15 - 41 U/L 15 17 -  ALT 0 - 44 U/L 11 13 -   Component     Latest Ref Rng & Units 08/04/2018  Total Protein ELP     6.0 - 8.5 g/dL 6.2  Albumin ELP     2.9 - 4.4 g/dL 2.6 (L)  Alpha-1-Globulin     0.0 - 0.4 g/dL 0.3  Alpha-2-Globulin     0.4 - 1.0 g/dL 0.9  Beta Globulin     0.7 - 1.3 g/dL 1.0  Gamma Globulin     0.4 - 1.8 g/dL 1.4  M-SPIKE, %     Not Observed g/dL Not Observed  SPE Interp.      Comment  Comment      Comment  Globulin, Total     2.2 - 3.9 g/dL 3.6  A/G Ratio     0.7 - 1.7 0.7  Iron     28 - 170 ug/dL 23 (L)  TIBC     250 - 450 ug/dL 196 (L)  Saturation Ratios     10.4 - 31.8 % 12  UIBC     ug/dL 173  Retic Ct Pct     0.4 - 3.1 % 2.0  RBC.     3.87 - 5.11 MIL/uL 3.42 (L)  Retic Count, Absolute     19.0 - 186.0 K/uL 69.8  Immature Retic Fract     2.3 - 15.9 % 29.4 (H)  Hepatitis B Surface Ag  Negative Negative  HCV Ab     0.0 - 0.9 s/co ratio 0.1  Hep A Ab, IgM     Negative Negative  Hep B Core Ab, IgM     Negative Negative  Vitamin B12     180 - 914 pg/mL 135 (L)  Folate     >5.9 ng/mL 11.5  Ferritin     11 - 307 ng/mL 91  Uric Acid, Serum     2.5 - 7.1 mg/dL 4.5  LDH     98 - 192 U/L 203 (H)  AFP, Serum, Tumor Marker     0.0 - 8.3 ng/mL 3.4  CEA     0.0 - 4.7 ng/mL 1.4  Beta-2 Microglobulin     0.6 - 2.4 mg/L 2.4  Magnesium     1.7 - 2.4 mg/dL 2.0  Phosphorus     2.5 - 4.6 mg/dL 2.7    RADIOGRAPHIC STUDIES: I have personally reviewed the  radiological images as listed and agreed with the findings in the report. Dg Chest 2 View  Result Date: 08/03/2018 CLINICAL DATA:  Fever with cough for 1 week. Nausea and vomiting. EXAM: CHEST - 2 VIEW COMPARISON:  None. FINDINGS: Normal heart size and pulmonary vascularity. Right paratracheal and left AP window mediastinal masses likely representing prominent lymphadenopathy. Consider lymphoma. CT chest suggested for further characterization. Lungs are clear and expanded. No blunting of costophrenic angles. No pneumothorax. IMPRESSION: Right paratracheal and left AP window masses likely representing lymphadenopathy. Consider CT chest for further characterization. Electronically Signed   By: Lucienne Capers M.D.   On: 08/03/2018 23:21   Ct Chest W Contrast  Result Date: 08/04/2018 CLINICAL DATA:  Mediastinal mass on chest radiography, for further characterization. EXAM: CT CHEST WITH CONTRAST TECHNIQUE: Multidetector CT imaging of the chest was performed during intravenous contrast administration. CONTRAST:  51mL OMNIPAQUE IOHEXOL 300 MG/ML  SOLN COMPARISON:  08/03/2018 FINDINGS: Cardiovascular: There is considerable extrinsic mass effect on the superior vena cava by the mediastinal mass, lowering the cross-sectional area to about 0.55 cm^2 on image 32/2, with some irregularity along the caval margin which could conceivably represent invasion of the cava or a small amount of marginal thrombus. No large acute pulmonary embolus although today's exam was not timed or performed to assess specifically for pulmonary embolus. There is narrowing of the left upper lobe pulmonary artery due to the mass. Mediastinum/Nodes: Confluent and irregular mediastinal mass also extending into the left hilum and questionably involving the periphery of the left lung/pleural margin. A representative measurement on image 48/2 measures 10.3 by 6.0 cm. The mass extends around much of the aortic arch and portions the branch vasculature  and abuts the left pulmonary artery. Extension in the anterior mediastinum has mass effect on the SVC is noted above. Both internal mammary arteries course through this tumor. The tumor involves the AP window. Left infrahilar node 1.2 cm in short axis on image 61/2. Pathologic supraclavicular adenopathy including a 1.2 cm node on image 9/2. A lower neck level IV lymph node measures 1.4 cm in short axis on image 1/2. Right supraclavicular node 1.0 cm in short axis, image 9/2. Pathologic left axillary and subpectoral adenopathy, index axillary node 2.2 cm in short axis on image 18/2. An upper mediastinal node below the thyroid gland measures 1.9 cm in short axis on image 16/2. Lungs/Pleura: Tumor extends around the left upper lobe tracheobronchial tree centrally. A lung nodule in the left upper lobe medially measures 1.0 by 1.1 cm on  image 38/4. Tumor tracks along the pleural space below the left first rib as shown on image 22/2, measuring about 1.7 cm anterior-posterior. Upper Abdomen: Nodular heterogeneous enhancement in the spleen favoring tumor involvement. An index lesion measures 2.5 by 1.9 cm on image 122/2, but multiple splenic nodules are present. There is likely a lymph node in the splenic hilum on image 133/2, the bottom most image. Porta hepatis adenopathy identified including a 1.3 cm peripancreatic node on image 132/2 and a portacaval node measuring 1.5 cm in short axis on image 133/2. A lymph node between the right hemidiaphragmatic crus and the IVC measures 1.2 cm in short axis also on image 132. Hypodense lesion medially in the right hepatic lobe on image 127/2 measures 1.6 by 1.3 cm. Questionable hypodense lesion peripherally in the right hepatic lobe about 0.9 cm in diameter on image 116/2. Musculoskeletal: Permeative destruction of the sternal manubrium, with tumor and/or hematoma along the anterior and posterior manubrial margins. The tumor rind anterior to the manubrium measures up to about 1.4  cm in thickness on image 97/6. IMPRESSION: 1. 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 In addition there are multiple nodular lesions in the spleen and possibly in the liver, as well as permeative destruction of the sternal manubrium with tumor rind anterior and posterior to the sternum. 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 Left-sided small cell lung cancer might give a similar appearance. Aggressive germ-cell tumor or thymic carcinoma or less likely differential diagnostic considerations. Tissue diagnosis recommended. 2. The anterior mediastinal mass is causing narrowing of the SVC, and mild invasion of the SVC with slight marginal thrombosis is not readily excluded. No large pulmonary embolus is identified. Electronically Signed   By: Van Clines M.D.   On: 08/04/2018 01:22    ASSESSMENT & PLAN:   37 y.o. female with no significant PMHx with   1. Anterior Mediastinal Mass - likely bulky lymphadenopathy with additional supraclavicular and left axillary LNadenopathy. LDH - no significantly elevated. Concern for lymphoma likely Hodgkins lymphoma vs less likely primary mediastinal lymphoma Less likely germ cell or other tumor.  2. Liver and splenic lesions concerning for involvement with lymphoma/malignancy  3. SVC compression due to anterior mediastinal mass - no upper extremity , neck or face swelling or change in visiion to suggest overt SVC syndrome  4. Microcytic anemia - normal iron. Likely anemia or chronic disease . Lab Results  Component Value Date   IRON 23 (L) 08/04/2018   TIBC 196 (L) 08/04/2018   IRONPCTSAT 12 08/04/2018   (Iron and TIBC)  Lab Results  Component Value Date   FERRITIN 91 08/04/2018   5. B12 deficiency ? Pernicious anemia  PLAN  - discussed lab results and CT chest results  with the patient.  -discussed with patient concerns for lymphoma or  -B12 replacement  1000 mcg daily -B complex -urgent US guided biopsy of supraclavicular or axillary LN. -transfuse prn for Hgb<8 or if symptomatic -will need CT abd/pelvis -will likely need BM Bx -would start high dose steroids after LN bx -sed rate -will need outpatient PET/CT for staging. -will continue to f/u -lovenox for DVT px after LN bx    All of the patients questions were answered with apparent satisfaction. The patient knows to call the clinic with any problems, questions or concerns.  The total time spent in the appt was 80  minutes and more than 50% was on counseling and direct patient cares.    Sullivan Lone MD MS AAHIVMS [facility] Hematology/Oncology Physician [facility]  (Office):       [phone] (Work cell):  [phone] (Fax):           [phone]  08/04/2018 12:02 PM  I, Baldwin [place], am acting as a scribe for Dr. Irene Limbo  .I have reviewed the above documentation for accuracy and completeness, and I agree with the above. Sullivan Lone MD MS

## 2018-08-04 NOTE — H&P (Signed)
Chief Complaint: Lymphadenopathy  Referring Physician(s): Raiford Noble Latif  Supervising Physician: Sandi Mariscal  Patient Status: [facility] - In-pt  History of Present Illness: Cassandra Clark is a 37 y.o. female with medical history significant of of anemia who presented to the emergency department complaining of new onset fever, a 2-week history of night sweats, dry cough, fatigue and weight loss secondary to decreased appetite.    Work up in the ER included CT scan which showed = 1. 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 In addition there are multiple nodular lesions in the spleen and possibly in the liver, as well as permeative destruction of the sternal manubrium with tumor rind anterior and posterior to the sternum. 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 Left-sided small cell lung cancer might give a similar appearance. Aggressive germ-cell tumor or thymic carcinoma or less likely differential diagnostic considerations. Tissue diagnosis recommended. 2. The anterior mediastinal mass is causing narrowing of the SVC, and mild invasion of the SVC with slight marginal thrombosis is not readily excluded. No large pulmonary embolus is identified.   We are asked to perform an image guided biopsy of the supraclavicular lymph node.   Past Medical History:  Diagnosis Date  . Anemia     History reviewed. No pertinent surgical history.  Allergies: Penicillins  Medications: Prior to Admission medications   Medication Sig Start Date End Date Taking? Authorizing Provider  ibuprofen (ADVIL,MOTRIN) 200 MG tablet Take 800 mg by mouth every 6 (six) hours as needed for moderate pain.   Yes [provider]  naproxen sodium (ALEVE) 220 MG tablet Take 220 mg by mouth 2 (two) times daily as needed (pain).    Yes [provider]  ferrous sulfate 325 (65 FE) MG tablet Take 1 tablet (325 mg total) by mouth daily. Patient not taking: Reported on 08/03/2018 07/04/18   Davonna Belling, MD     Family History  Problem Relation Age of Onset  . Diabetes Mellitus II Father   . Hypertension Father   . Congestive Heart Failure Maternal Grandmother   . Diabetes Mellitus II Maternal Grandmother   . Diabetes Mellitus II Maternal Uncle   . Congestive Heart Failure Maternal Uncle     Social History   Socioeconomic History  . Marital status: Legally Separated    Spouse name: Not on file  . Number of children: Not on file  . Years of education: Not on file  . Highest education level: Not on file  Occupational History  . Not on file  Social Needs  . Financial resource strain: Not on file  . Food insecurity:    Worry: Not on file    Inability: Not on file  . Transportation needs:    Medical: Not on file    Non-medical: Not on file  Tobacco Use  . Smoking status: Never Smoker  . Smokeless tobacco: Never Used  Substance and Sexual Activity  . Alcohol use: No  . Drug use: No    Comment: Drinks tea  . Sexual activity: Not on file  Lifestyle  . Physical activity:    Days per week: Not on file    Minutes per session: Not on file  . Stress: Not on file  Relationships  . Social connections:    Talks on phone: Not on file    Gets together: Not on  file    Attends religious service: Not on file    Active member of club or organization: Not on file    Attends meetings of clubs or organizations: Not on file    Relationship status: Not on file  Other Topics Concern  . Not on file  Social History Narrative  . Not on file     Review of Systems: A 12 point ROS discussed and pertinent positives are indicated in the HPI above.  All other systems are negative.  Review of Systems  Vital Signs: BP 123/75 (BP Location: Right Arm)   Pulse (!) 117   Temp 99.7 F (37.6 C) (Oral)   Resp 16    Ht 5\' 7"  (1.702 m)   Wt 71.8 kg   LMP 07/26/2018   SpO2 100%   BMI 24.79 kg/m   Physical Exam  Constitutional: She is oriented to person, place, and time. She appears well-developed.  HENT:  Head: Normocephalic and atraumatic.  Eyes: EOM are normal.  Neck: Normal range of motion.  Cardiovascular: Regular rhythm.  Tachy  Pulmonary/Chest: Effort normal and breath sounds normal.  Abdominal: Soft. She exhibits no distension. There is no tenderness.  Musculoskeletal: Normal range of motion.  Neurological: She is alert and oriented to person, place, and time.  Skin: Skin is warm and dry.  Psychiatric: She has a normal mood and affect. Her behavior is normal. Judgment and thought content normal.  Vitals reviewed.   Imaging: Dg Chest 2 View  Result Date: 08/03/2018 CLINICAL DATA:  Fever with cough for 1 week. Nausea and vomiting. EXAM: CHEST - 2 VIEW COMPARISON:  None. FINDINGS: Normal heart size and pulmonary vascularity. Right paratracheal and left AP window mediastinal masses likely representing prominent lymphadenopathy. Consider lymphoma. CT chest suggested for further characterization. Lungs are clear and expanded. No blunting of costophrenic angles. No pneumothorax. IMPRESSION: Right paratracheal and left AP window masses likely representing lymphadenopathy. Consider CT chest for further characterization. Electronically Signed   By: Lucienne Capers M.D.   On: 08/03/2018 23:21   Ct Chest W Contrast  Result Date: 08/04/2018 CLINICAL DATA:  Mediastinal mass on chest radiography, for further characterization. EXAM: CT CHEST WITH CONTRAST TECHNIQUE: Multidetector CT imaging of the chest was performed during intravenous contrast administration. CONTRAST:  47mL OMNIPAQUE IOHEXOL 300 MG/ML  SOLN COMPARISON:  08/03/2018 FINDINGS: Cardiovascular: There is considerable extrinsic mass effect on the superior vena cava by the mediastinal mass, lowering the cross-sectional area to about 0.55 cm^2  on image 32/2, with some irregularity along the caval margin which could conceivably represent invasion of the cava or a small amount of marginal thrombus. No large acute pulmonary embolus although today's exam was not timed or performed to assess specifically for pulmonary embolus. There is narrowing of the left upper lobe pulmonary artery due to the mass. Mediastinum/Nodes: Confluent and irregular mediastinal mass also extending into the left hilum and questionably involving the periphery of the left lung/pleural margin. A representative measurement on image 48/2 measures 10.3 by 6.0 cm. The mass extends around much of the aortic arch and portions the branch vasculature and abuts the left pulmonary artery. Extension in the anterior mediastinum has mass effect on the SVC is noted above. Both internal mammary arteries course through this tumor. The tumor involves the AP window. Left infrahilar node 1.2 cm in short axis on image 61/2. Pathologic supraclavicular adenopathy including a 1.2 cm node on image 9/2. A lower neck level IV lymph node measures 1.4 cm in short  axis on image 1/2. Right supraclavicular node 1.0 cm in short axis, image 9/2. Pathologic left axillary and subpectoral adenopathy, index axillary node 2.2 cm in short axis on image 18/2. An upper mediastinal node below the thyroid gland measures 1.9 cm in short axis on image 16/2. Lungs/Pleura: Tumor extends around the left upper lobe tracheobronchial tree centrally. A lung nodule in the left upper lobe medially measures 1.0 by 1.1 cm on image 38/4. Tumor tracks along the pleural space below the left first rib as shown on image 22/2, measuring about 1.7 cm anterior-posterior. Upper Abdomen: Nodular heterogeneous enhancement in the spleen favoring tumor involvement. An index lesion measures 2.5 by 1.9 cm on image 122/2, but multiple splenic nodules are present. There is likely a lymph node in the splenic hilum on image 133/2, the bottom most image. Porta  hepatis adenopathy identified including a 1.3 cm peripancreatic node on image 132/2 and a portacaval node measuring 1.5 cm in short axis on image 133/2. A lymph node between the right hemidiaphragmatic crus and the IVC measures 1.2 cm in short axis also on image 132. Hypodense lesion medially in the right hepatic lobe on image 127/2 measures 1.6 by 1.3 cm. Questionable hypodense lesion peripherally in the right hepatic lobe about 0.9 cm in diameter on image 116/2. Musculoskeletal: Permeative destruction of the sternal manubrium, with tumor and/or hematoma along the anterior and posterior manubrial margins. The tumor rind anterior to the manubrium measures up to about 1.4 cm in thickness on image 97/6. IMPRESSION: 1. 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 In addition there are multiple nodular lesions in the spleen and possibly in the liver, as well as permeative destruction of the sternal manubrium with tumor rind anterior and posterior to the sternum. 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 Left-sided small cell lung cancer might give a similar appearance. Aggressive germ-cell tumor or thymic carcinoma or less likely differential diagnostic considerations. Tissue diagnosis recommended. 2. The anterior mediastinal mass is causing narrowing of the SVC, and mild invasion of the SVC with slight marginal thrombosis is not readily excluded. No large pulmonary embolus is identified. Electronically Signed   By: Van Clines M.D.   On: 08/04/2018 01:22    Labs:  CBC: Recent Labs    07/04/18 1618 08/03/18 2323 08/04/18 0402  WBC 4.6 3.6* 3.0*  HGB 9.8* 8.6* 8.3*  HCT 32.3* 29.3* 28.7*  PLT 433* 451* 459*    COAGS: Recent Labs    08/04/18 0402  INR 1.34  APTT 45*    BMP: Recent Labs    07/04/18 1618 08/03/18 2323  08/04/18 0551  NA 137 136 140  K 3.6 3.7 3.5  CL 103 103 106  CO2 27 25 26   GLUCOSE 120* 107* 95  BUN 12 9 8   CALCIUM 8.5* 8.4* 8.0*  CREATININE 0.81 0.81 0.69  GFRNONAA >60 >60 >60  GFRAA >60 >60 >60    LIVER FUNCTION TESTS: Recent Labs    08/03/18 2323 08/04/18 0551  BILITOT 0.6 0.6  AST 17 15  ALT 13 11  ALKPHOS 61 51  PROT 7.5 6.8  ALBUMIN 3.2* 2.7*    TUMOR MARKERS: No results for input(s): AFPTM, CEA, CA199, CHROMGRNA in the last 8760 hours.  Assessment and Plan:  Anterior mediastinal mass extending to the left hilum, with notable mediastinal, left axillary and suprapectoral, supraclavicular, and porta hepatis/upper abdominal  adenopathy.  Will proceed with image guided left supraclavicular lymph node biopsy today by Dr. Pascal Lux.  Risks and benefits discussed with the patient including, but not limited to bleeding, infection, damage to adjacent structures or low yield requiring additional tests.  All of the patient's questions were answered, patient is agreeable to proceed. Consent signed and in chart.  Thank you for this interesting consult.  I greatly enjoyed meeting Cassandra Clark and look forward to participating in their care.  A copy of this report was sent to the requesting provider on this date.  Electronically Signed: Murrell Redden, PA-C   08/04/2018, 1:53 PM      I spent a total of 40 Minutes in face to face in clinical consultation, greater than 50% of which was counseling/coordinating care for lymph node biopsy.

## 2018-08-04 NOTE — ED Notes (Signed)
ED Provider at bedside. 

## 2018-08-04 NOTE — ED Notes (Signed)
Patient transported to CT 

## 2018-08-04 NOTE — H&P (Signed)
History and Physical    Cassandra Clark RCV:[id number] DOB: April 15, 1981 DOA: 08/03/2018  PCP: Patient, No Pcp Per   Patient coming from: Home.  I have personally briefly reviewed patient's old medical records in [place]  Chief Complaint: Fever.  HPI: Cassandra Clark is a 37 y.o. female with medical history significant of of anemia who is coming to the emergency department with complaints of new onset fever associated with a 2-week history of night sweats, dry cough, fatigue and decreased appetite.  She has been told by coworkers that she has lost weight, which the patient says is unintentional.  She also complains of headaches for couple of months for which she was seen in the ER about a month ago.  She denies rhinorrhea, sore throat, has a dry cough, but denies mucus production.  She states that one of her cough episodes with so intense and persistent.induce an episode with nausea and emesis while she was at work.  She denies chest pain, palpitations, dizziness, diaphoresis, PND, orthopnea or pitting edema of the lower extremities.  No abdominal pain, diarrhea, constipation, melena or hematochezia.  She denies dysuria, frequency or hematuria.  No heat or cold intolerance.  No polyuria, polydipsia, polyphagia or blurred vision.  She denies skin pruritus.  ED Course: Initial vital signs temperature 102.4 F, pulse 128, respirations 20, blood pressure 126/74 mmHg and O2 sat 100% on room air.  The patient did receive 650 mg of acetaminophen and 1000 mL normal saline bolus.  Her work-up shows a urinalysis that has a hazy appearance, with small hemoglobinuria, trace leukocyte esterase on chemical exam.  Microscopic exam shows 11-20 RBC, 0-5 WBC and rare bacteria.  CBC shows a white count of 3.6 with 70 percent neutrophils, 11% lymphocytes and 18% monocytes.  Platelets were 451.  Influenza A and influenza B by PCR were negative.  CMP shows a glucose of 107 mg/dL and an albumin of 3.2 g/dL.  All other  values are within normal limits.  Calcium was 8.4 (9.01 corrected to an albumin of 3.2), magnesium was 1.9 and phosphorus 2.0 mg/dL.  Imaging: CT chest with contrast shows a large primarily anterior mediastinal mass extending to the left hilum, with notable mediastinal, left axillary and suprapectoral, supraclavicular, and porta hepatis/upper abdominal adenopathy. In addition there are multiple nodular lesions in the spleen and possibly in the liver, as well as permeative destruction of the sternal manubrium with tumor rind anterior and posterior to the sternum. 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 Left-sided small cell lung cancer might give a similar appearance. Aggressive germ-cell tumor or thymic carcinoma or less likely differential diagnostic considerations. Tissue diagnosis recommended. 2. The anterior mediastinal mass is causing narrowing of the SVC, and mild invasion of the SVC with slight marginal thrombosis is not readily excluded. No large pulmonary embolus is identified.  Please see images and full radiology report for further detail.  Review of Systems: As per HPI otherwise 10 point review of systems negative.   Past Medical History:  Diagnosis Date  . Anemia     History reviewed. No pertinent surgical history.   reports that she has never smoked. She has never used smokeless tobacco. She reports that she does not drink alcohol or use drugs.  Allergies  Allergen Reactions  . Penicillins Hives    Has patient had a PCN reaction causing immediate rash, facial/tongue/throat swelling, SOB or lightheadedness with hypotension: Unknown Has patient had a PCN reaction causing severe  rash involving mucus membranes or skin necrosis: Unknown Has patient had a PCN reaction that required hospitalization: Unknown Has patient had a PCN reaction occurring within the last 10 years: Unknown If all of the above answers are "NO", then may proceed with Cephalosporin use.      Family History  Problem Relation Age of Onset  . Diabetes Mellitus II Father   . Hypertension Father   . Congestive Heart Failure Maternal Grandmother   . Diabetes Mellitus II Maternal Grandmother   . Diabetes Mellitus II Maternal Uncle   . Congestive Heart Failure Maternal Uncle    Prior to Admission medications   Medication Sig Start Date End Date Taking? Authorizing Provider  ibuprofen (ADVIL,MOTRIN) 200 MG tablet Take 800 mg by mouth every 6 (six) hours as needed for moderate pain.   Yes [provider]  naproxen sodium (ALEVE) 220 MG tablet Take 220 mg by mouth 2 (two) times daily as needed (pain).   Yes [provider]  ferrous sulfate 325 (65 FE) MG tablet Take 1 tablet (325 mg total) by mouth daily. Patient not taking: Reported on 08/03/2018 07/04/18   Davonna Belling, MD    Physical Exam: Vitals:   08/04/18 0238 08/04/18 0300 08/04/18 0304 08/04/18 0330  BP: 107/68 123/81  112/77  Pulse: (!) 104 (!) 104  (!) 104  Resp: 20 13  12   Temp:   98.3 F (36.8 C)   TempSrc:   Oral   SpO2: 100% 100%  100%  Weight:      Height:        Constitutional: NAD, calm, comfortable Eyes: PERRL, lids and conjunctivae normal ENMT: Mucous membranes are moist. Posterior pharynx clear of any exudate or lesions. Neck: normal, supple, positive multiple cervical adenopathies, particularly enlarged, nontender, hard lymph node on left supraclavicular anterior cervical area.  No thyromegaly Respiratory: clear to auscultation bilaterally, no wheezing, no crackles. Normal respiratory effort. No accessory muscle use.  Cardiovascular: Regular rate and rhythm, no murmurs / rubs / gallops. No extremity edema. 2+ pedal pulses. No carotid bruits.  Abdomen: Soft, no tenderness, no masses palpated. No hepatosplenomegaly. Bowel sounds positive.  Musculoskeletal: no clubbing / cyanosis. No joint deformity upper and lower extremities. Good ROM, no contractures. Normal muscle tone.  Skin:  no rashes, lesions, ulcers. No induration Neurologic: CN 2-12 grossly intact. Sensation intact, DTR normal. Strength 5/5 in all 4.  Psychiatric: Normal judgment and insight. Alert and oriented x 3. Normal mood.   Labs on Admission: I have personally reviewed following labs and imaging studies  CBC: Recent Labs  Lab 08/03/18 2323  WBC 3.6*  NEUTROABS 2.5  HGB 8.6*  HCT 29.3*  MCV 77.7*  PLT 332*   Basic Metabolic Panel: Recent Labs  Lab 08/03/18 2323  NA 136  K 3.7  CL 103  CO2 25  GLUCOSE 107*  BUN 9  CREATININE 0.81  CALCIUM 8.4*  MG 1.9  PHOS 2.0*   GFR: Estimated Creatinine Clearance: 90.7 mL/min (by C-G formula based on SCr of 0.81 mg/dL). Liver Function Tests: Recent Labs  Lab 08/03/18 2323  AST 17  ALT 13  ALKPHOS 61  BILITOT 0.6  PROT 7.5  ALBUMIN 3.2*   No results for input(s): LIPASE, AMYLASE in the last 168 hours. No results for input(s): AMMONIA in the last 168 hours. Coagulation Profile: No results for input(s): INR, PROTIME in the last 168 hours. Cardiac Enzymes: No results for input(s): CKTOTAL, CKMB, CKMBINDEX, TROPONINI in the last 168 hours. BNP (  last 3 results) No results for input(s): PROBNP in the last 8760 hours. HbA1C: No results for input(s): HGBA1C in the last 72 hours. CBG: No results for input(s): GLUCAP in the last 168 hours. Lipid Profile: No results for input(s): CHOL, HDL, LDLCALC, TRIG, CHOLHDL, LDLDIRECT in the last 72 hours. Thyroid Function Tests: No results for input(s): TSH, T4TOTAL, FREET4, T3FREE, THYROIDAB in the last 72 hours. Anemia Panel: No results for input(s): VITAMINB12, FOLATE, FERRITIN, TIBC, IRON, RETICCTPCT in the last 72 hours. Urine analysis:    Component Value Date/Time   COLORURINE YELLOW 08/03/2018 2256   APPEARANCEUR HAZY (A) 08/03/2018 2256   LABSPEC 1.020 08/03/2018 2256   PHURINE 7.0 08/03/2018 2256   GLUCOSEU NEGATIVE 08/03/2018 2256   HGBUR SMALL (A) 08/03/2018 2256   BILIRUBINUR  NEGATIVE 08/03/2018 2256   KETONESUR NEGATIVE 08/03/2018 2256   PROTEINUR NEGATIVE 08/03/2018 2256   NITRITE NEGATIVE 08/03/2018 2256   LEUKOCYTESUR TRACE (A) 08/03/2018 2256    Radiological Exams on Admission: Dg Chest 2 View  Result Date: 08/03/2018 CLINICAL DATA:  Fever with cough for 1 week. Nausea and vomiting. EXAM: CHEST - 2 VIEW COMPARISON:  None. FINDINGS: Normal heart size and pulmonary vascularity. Right paratracheal and left AP window mediastinal masses likely representing prominent lymphadenopathy. Consider lymphoma. CT chest suggested for further characterization. Lungs are clear and expanded. No blunting of costophrenic angles. No pneumothorax. IMPRESSION: Right paratracheal and left AP window masses likely representing lymphadenopathy. Consider CT chest for further characterization. Electronically Signed   By: Lucienne Capers M.D.   On: 08/03/2018 23:21   Ct Chest W Contrast  Result Date: 08/04/2018 CLINICAL DATA:  Mediastinal mass on chest radiography, for further characterization. EXAM: CT CHEST WITH CONTRAST TECHNIQUE: Multidetector CT imaging of the chest was performed during intravenous contrast administration. CONTRAST:  57mL OMNIPAQUE IOHEXOL 300 MG/ML  SOLN COMPARISON:  08/03/2018 FINDINGS: Cardiovascular: There is considerable extrinsic mass effect on the superior vena cava by the mediastinal mass, lowering the cross-sectional area to about 0.55 cm^2 on image 32/2, with some irregularity along the caval margin which could conceivably represent invasion of the cava or a small amount of marginal thrombus. No large acute pulmonary embolus although today's exam was not timed or performed to assess specifically for pulmonary embolus. There is narrowing of the left upper lobe pulmonary artery due to the mass. Mediastinum/Nodes: Confluent and irregular mediastinal mass also extending into the left hilum and questionably involving the periphery of the left lung/pleural margin. A  representative measurement on image 48/2 measures 10.3 by 6.0 cm. The mass extends around much of the aortic arch and portions the branch vasculature and abuts the left pulmonary artery. Extension in the anterior mediastinum has mass effect on the SVC is noted above. Both internal mammary arteries course through this tumor. The tumor involves the AP window. Left infrahilar node 1.2 cm in short axis on image 61/2. Pathologic supraclavicular adenopathy including a 1.2 cm node on image 9/2. A lower neck level IV lymph node measures 1.4 cm in short axis on image 1/2. Right supraclavicular node 1.0 cm in short axis, image 9/2. Pathologic left axillary and subpectoral adenopathy, index axillary node 2.2 cm in short axis on image 18/2. An upper mediastinal node below the thyroid gland measures 1.9 cm in short axis on image 16/2. Lungs/Pleura: Tumor extends around the left upper lobe tracheobronchial tree centrally. A lung nodule in the left upper lobe medially measures 1.0 by 1.1 cm on image 38/4. Tumor tracks along the pleural  space below the left first rib as shown on image 22/2, measuring about 1.7 cm anterior-posterior. Upper Abdomen: Nodular heterogeneous enhancement in the spleen favoring tumor involvement. An index lesion measures 2.5 by 1.9 cm on image 122/2, but multiple splenic nodules are present. There is likely a lymph node in the splenic hilum on image 133/2, the bottom most image. Porta hepatis adenopathy identified including a 1.3 cm peripancreatic node on image 132/2 and a portacaval node measuring 1.5 cm in short axis on image 133/2. A lymph node between the right hemidiaphragmatic crus and the IVC measures 1.2 cm in short axis also on image 132. Hypodense lesion medially in the right hepatic lobe on image 127/2 measures 1.6 by 1.3 cm. Questionable hypodense lesion peripherally in the right hepatic lobe about 0.9 cm in diameter on image 116/2. Musculoskeletal: Permeative destruction of the sternal  manubrium, with tumor and/or hematoma along the anterior and posterior manubrial margins. The tumor rind anterior to the manubrium measures up to about 1.4 cm in thickness on image 97/6. IMPRESSION: 1. 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 In addition there are multiple nodular lesions in the spleen and possibly in the liver, as well as permeative destruction of the sternal manubrium with tumor rind anterior and posterior to the sternum. 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 Left-sided small cell lung cancer might give a similar appearance. Aggressive germ-cell tumor or thymic carcinoma or less likely differential diagnostic considerations. Tissue diagnosis recommended. 2. The anterior mediastinal mass is causing narrowing of the SVC, and mild invasion of the SVC with slight marginal thrombosis is not readily excluded. No large pulmonary embolus is identified. Electronically Signed   By: Van Clines M.D.   On: 08/04/2018 01:22    EKG: Independently reviewed.    Assessment/Plan Principal Problem:   Superior vena cava syndrome   Mediastinal mass There is no facial plethora, chest pain, dyspnea, vascular or any neurological manifestations. Admit to telemetry/inpatient. Supplemental oxygen as needed. Cough suppressants and antiemetics as needed. Check R00, folic acid, iron studies, LDH, CEA, uric acid. Check HIV, hep B, hep C. Check beta-2 microglobulin 7 protein electrophoresis. Hematology/oncology consult requested by Dr. Tomi Bamberger. Will likely need cardiothoracic surgery for mediastinal mass biopsy. Will not start full dose anticoagulation in the absence of clear thrombosis evidence. Anticoagulation also deferred anticipating CT evaluation.  Active Problems:   Hypophosphatemia Replacement in order. Follow-up  phosphorus level as needed.    Anemia Check anemia panel. Check occult blood in feces. Monitor hematocrit and hemoglobin.    Leukopenia Likely secondary to lymphoma. Follow-up WBC.    DVT prophylaxis: Heparin SQ. Code Status: Full code. Family Communication: Her daughter was present in the ED room. Disposition Plan: Admit for further evaluation and treatment. Consults called: Hematology/oncology at [facility] notified of patient's arrival by Dr. Tomi Bamberger. Admission status: Inpatient/telemetry.   Reubin Milan MD [facility] Pager [phone].  If 7PM-7AM, please contact night-coverage [email or web address] Password [email or web address]  08/04/2018, 3:53 AM

## 2018-08-04 NOTE — Procedures (Signed)
Pre Procedure Dx: Anterior mediastinal mass and left supraclavicular lymphadenopathy Post Procedural Dx: Same  Technically successful US guided biopsy of dominant left supraclavicular lymph node.   EBL: None  No immediate complications.   Ronny Bacon, MD Pager #: [phone]

## 2018-08-05 ENCOUNTER — Ambulatory Visit
Admit: 2018-08-05 | Discharge: 2018-08-05 | Disposition: A | Discharge status: Home / Self Care | Payer: Self-pay | Attending: Radiation Oncology | Admitting: Radiation Oncology

## 2018-08-05 ENCOUNTER — Inpatient Hospital Stay (HOSPITAL_COMMUNITY): Payer: Medicaid Other

## 2018-08-05 ENCOUNTER — Encounter (HOSPITAL_COMMUNITY): Payer: Self-pay | Admitting: Radiology

## 2018-08-05 ENCOUNTER — Ambulatory Visit: Payer: Self-pay | Admitting: Radiation Oncology

## 2018-08-05 DIAGNOSIS — J9859 Other diseases of mediastinum, not elsewhere classified: Secondary | ICD-10-CM

## 2018-08-05 DIAGNOSIS — C8198 Hodgkin lymphoma, unspecified, lymph nodes of multiple sites: Secondary | ICD-10-CM

## 2018-08-05 DIAGNOSIS — E538 Deficiency of other specified B group vitamins: Secondary | ICD-10-CM

## 2018-08-05 DIAGNOSIS — D649 Anemia, unspecified: Secondary | ICD-10-CM

## 2018-08-05 DIAGNOSIS — R739 Hyperglycemia, unspecified: Secondary | ICD-10-CM

## 2018-08-05 DIAGNOSIS — D509 Iron deficiency anemia, unspecified: Secondary | ICD-10-CM

## 2018-08-05 DIAGNOSIS — C8598 Non-Hodgkin lymphoma, unspecified, lymph nodes of multiple sites: Secondary | ICD-10-CM

## 2018-08-05 DIAGNOSIS — I313 Pericardial effusion (noninflammatory): Secondary | ICD-10-CM

## 2018-08-05 LAB — CBC WITH DIFFERENTIAL/PLATELET
ABS IMMATURE GRANULOCYTES: 0.02 10*3/uL (ref 0.00–0.07)
BASOS ABS: 0 10*3/uL (ref 0.0–0.1)
BASOS PCT: 0 %
Eosinophils Absolute: 0 10*3/uL (ref 0.0–0.5)
Eosinophils Relative: 0 %
HCT: 26.6 % — ABNORMAL LOW (ref 36.0–46.0)
Hemoglobin: 7.7 g/dL — ABNORMAL LOW (ref 12.0–15.0)
IMMATURE GRANULOCYTES: 1 %
LYMPHS PCT: 12 %
Lymphs Abs: 0.3 10*3/uL — ABNORMAL LOW (ref 0.7–4.0)
MCH: 23 pg — AB (ref 26.0–34.0)
MCHC: 28.9 g/dL — ABNORMAL LOW (ref 30.0–36.0)
MCV: 79.4 fL — AB (ref 80.0–100.0)
MONO ABS: 0.7 10*3/uL (ref 0.1–1.0)
Monocytes Relative: 23 %
NEUTROS ABS: 1.9 10*3/uL (ref 1.7–7.7)
Neutrophils Relative %: 64 %
PLATELETS: 417 10*3/uL — AB (ref 150–400)
RBC: 3.35 MIL/uL — AB (ref 3.87–5.11)
RDW: 18.4 % — AB (ref 11.5–15.5)
WBC: 2.9 10*3/uL — AB (ref 4.0–10.5)
nRBC: 0 % (ref 0.0–0.2)

## 2018-08-05 LAB — COMPREHENSIVE METABOLIC PANEL
ALT: 10 U/L (ref 0–44)
AST: 14 U/L — AB (ref 15–41)
Albumin: 2.5 g/dL — ABNORMAL LOW (ref 3.5–5.0)
Alkaline Phosphatase: 45 U/L (ref 38–126)
Anion gap: 10 (ref 5–15)
BUN: 6 mg/dL (ref 6–20)
CHLORIDE: 111 mmol/L (ref 98–111)
CO2: 22 mmol/L (ref 22–32)
CREATININE: 0.64 mg/dL (ref 0.44–1.00)
Calcium: 7.9 mg/dL — ABNORMAL LOW (ref 8.9–10.3)
Glucose, Bld: 103 mg/dL — ABNORMAL HIGH (ref 70–99)
Potassium: 4.2 mmol/L (ref 3.5–5.1)
Sodium: 143 mmol/L (ref 135–145)
TOTAL PROTEIN: 6.2 g/dL — AB (ref 6.5–8.1)
Total Bilirubin: 0.3 mg/dL (ref 0.3–1.2)

## 2018-08-05 LAB — PULMONARY FUNCTION TEST
DL/VA % pred: 87 %
DL/VA: 4.49 ml/min/mmHg/L
DLCO UNC % PRED: 51 %
DLCO UNC: 14.45 ml/min/mmHg
DLCO cor % pred: 66 %
DLCO cor: 18.85 ml/min/mmHg
FEF 25-75 PRE: 1.33 L/s
FEF 25-75 Post: 2.82 L/sec
FEF2575-%Change-Post: 112 %
FEF2575-%Pred-Post: 91 %
FEF2575-%Pred-Pre: 43 %
FEV1-%CHANGE-POST: 24 %
FEV1-%PRED-POST: 84 %
FEV1-%PRED-PRE: 68 %
FEV1-POST: 2.4 L
FEV1-PRE: 1.93 L
FEV1FVC-%Change-Post: 13 %
FEV1FVC-%Pred-Pre: 84 %
FEV6-%CHANGE-POST: 9 %
FEV6-%PRED-POST: 88 %
FEV6-%Pred-Pre: 80 %
FEV6-PRE: 2.71 L
FEV6-Post: 2.98 L
FEV6FVC-%CHANGE-POST: 0 %
FEV6FVC-%PRED-PRE: 101 %
FEV6FVC-%Pred-Post: 102 %
FVC-%CHANGE-POST: 9 %
FVC-%Pred-Post: 86 %
FVC-%Pred-Pre: 79 %
FVC-Post: 2.98 L
FVC-Pre: 2.72 L
PRE FEV6/FVC RATIO: 100 %
Post FEV1/FVC ratio: 81 %
Post FEV6/FVC ratio: 100 %
Pre FEV1/FVC ratio: 71 %
RV % PRED: 98 %
RV: 1.7 L
TLC % PRED: 83 %
TLC: 4.62 L

## 2018-08-05 LAB — SEDIMENTATION RATE: Sed Rate: 62 mm/hr — ABNORMAL HIGH (ref 0–22)

## 2018-08-05 LAB — HEPATITIS PANEL, ACUTE
HCV Ab: 0.1 s/co ratio (ref 0.0–0.9)
HEP B S AG: NEGATIVE
Hep A IgM: NEGATIVE
Hep B C IgM: NEGATIVE

## 2018-08-05 LAB — AFP TUMOR MARKER: AFP, SERUM, TUMOR MARKER: 3.4 ng/mL (ref 0.0–8.3)

## 2018-08-05 LAB — HIV ANTIBODY (ROUTINE TESTING W REFLEX): HIV Screen 4th Generation wRfx: NONREACTIVE

## 2018-08-05 LAB — PROTEIN ELECTROPHORESIS, SERUM
A/G RATIO SPE: 0.7 (ref 0.7–1.7)
Albumin ELP: 2.6 g/dL — ABNORMAL LOW (ref 2.9–4.4)
Alpha-1-Globulin: 0.3 g/dL (ref 0.0–0.4)
Alpha-2-Globulin: 0.9 g/dL (ref 0.4–1.0)
Beta Globulin: 1 g/dL (ref 0.7–1.3)
Gamma Globulin: 1.4 g/dL (ref 0.4–1.8)
Globulin, Total: 3.6 g/dL (ref 2.2–3.9)
Total Protein ELP: 6.2 g/dL (ref 6.0–8.5)

## 2018-08-05 LAB — ECHOCARDIOGRAM COMPLETE
HEIGHTINCHES: 67 in
WEIGHTICAEL: 2532.64 [oz_av]

## 2018-08-05 LAB — PHOSPHORUS: Phosphorus: 2.8 mg/dL (ref 2.5–4.6)

## 2018-08-05 LAB — BETA 2 MICROGLOBULIN, SERUM: BETA 2 MICROGLOBULIN: 2.4 mg/L (ref 0.6–2.4)

## 2018-08-05 LAB — CEA: CEA: 1.4 ng/mL (ref 0.0–4.7)

## 2018-08-05 LAB — MAGNESIUM: MAGNESIUM: 2 mg/dL (ref 1.7–2.4)

## 2018-08-05 LAB — ABO/RH: ABO/RH(D): O POS

## 2018-08-05 MED ORDER — ALBUTEROL SULFATE (2.5 MG/3ML) 0.083% IN NEBU
2.5000 mg | INHALATION_SOLUTION | Freq: Once | RESPIRATORY_TRACT | Status: AC
  Administered 2018-08-05: 2.5 mg via RESPIRATORY_TRACT

## 2018-08-05 MED ORDER — IOPAMIDOL (ISOVUE-300) INJECTION 61%
INTRAVENOUS | Status: AC
  Administered 2018-08-05: 13:00:00
  Filled 2018-08-05: qty 30

## 2018-08-05 MED ORDER — IOPAMIDOL (ISOVUE-300) INJECTION 61%
INTRAVENOUS | Status: AC
  Administered 2018-08-05: 30 mL via ORAL
  Filled 2018-08-05: qty 30

## 2018-08-05 MED ORDER — SODIUM CHLORIDE 0.9% IV SOLUTION
250.0000 mL | Freq: Once | INTRAVENOUS | Status: AC
  Administered 2018-08-06: 250 mL via INTRAVENOUS

## 2018-08-05 MED ORDER — IOPAMIDOL (ISOVUE-300) INJECTION 61%
30.0000 mL | Freq: Once | INTRAVENOUS | Status: AC | PRN
  Administered 2018-08-05: 30 mL via ORAL

## 2018-08-05 MED ORDER — DEXAMETHASONE 6 MG PO TABS
20.0000 mg | ORAL_TABLET | Freq: Every day | ORAL | Status: DC
  Administered 2018-08-05 – 2018-08-06 (×2): 20 mg via ORAL
  Filled 2018-08-05 (×3): qty 1

## 2018-08-05 MED ORDER — IOHEXOL 300 MG/ML  SOLN
100.0000 mL | Freq: Once | INTRAMUSCULAR | Status: AC | PRN
  Administered 2018-08-05: 100 mL via INTRAVENOUS

## 2018-08-05 MED ORDER — HEPARIN SOD (PORK) LOCK FLUSH 100 UNIT/ML IV SOLN
500.0000 [IU] | Freq: Every day | INTRAVENOUS | Status: DC | PRN
  Filled 2018-08-05: qty 5

## 2018-08-05 MED ORDER — METHYLPREDNISOLONE SODIUM SUCC 40 MG IJ SOLR
40.0000 mg | Freq: Once | INTRAMUSCULAR | Status: AC
  Administered 2018-08-06: 40 mg via INTRAVENOUS
  Filled 2018-08-05: qty 1

## 2018-08-05 MED ORDER — IOPAMIDOL (ISOVUE-300) INJECTION 61%: 15.0000 mL | Freq: Two times a day (BID) | INTRAVENOUS | Status: DC | PRN

## 2018-08-05 MED ORDER — SODIUM CHLORIDE 0.9 % IJ SOLN
INTRAMUSCULAR | Status: AC
  Filled 2018-08-05: qty 50

## 2018-08-05 MED ORDER — ACETAMINOPHEN 325 MG PO TABS
650.0000 mg | ORAL_TABLET | Freq: Once | ORAL | Status: AC
  Administered 2018-08-06: 650 mg via ORAL
  Filled 2018-08-05: qty 2

## 2018-08-05 MED ORDER — DIPHENHYDRAMINE HCL 25 MG PO CAPS
25.0000 mg | ORAL_CAPSULE | Freq: Once | ORAL | Status: AC
  Administered 2018-08-06: 25 mg via ORAL
  Filled 2018-08-05: qty 1

## 2018-08-05 MED ORDER — B COMPLEX-C PO TABS
1.0000 | ORAL_TABLET | Freq: Every day | ORAL | Status: DC
  Administered 2018-08-05 – 2018-08-06 (×2): 1 via ORAL
  Filled 2018-08-05 (×2): qty 1

## 2018-08-05 MED ORDER — HEPARIN SOD (PORK) LOCK FLUSH 100 UNIT/ML IV SOLN
250.0000 [IU] | INTRAVENOUS | Status: DC | PRN
  Filled 2018-08-05: qty 2.5

## 2018-08-05 MED ORDER — SODIUM CHLORIDE 0.9% FLUSH: 3.0000 mL | INTRAVENOUS | Status: DC | PRN

## 2018-08-05 MED ORDER — SODIUM CHLORIDE 0.9% FLUSH: 10.0000 mL | INTRAVENOUS | Status: DC | PRN

## 2018-08-05 NOTE — Consult Note (Signed)
Chief Complaint: Patient was seen in consultation today for Hodgkin's lymphoma.  Referring Physician(s): Brunetta Genera  Supervising Physician: Marybelle Killings  Patient Status: [facility] - In-pt  History of Present Illness: Cassandra Clark is a 37 y.o. female with a past medical history of anemia. She presented to [facility] ED 08/03/2018 with complaints of fever and night sweats. 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 She underwent an image-guided left supraclavicular lymph node biopsy in IR 08/04/2018 by Dr. Pascal Lux. Oncology was consulted and Dr. Irene Limbo feels Hodgkin's lymphoma is most likely the diagnosis.  IR requested by Dr. Irene Limbo for possible image-guided bone marrow aspiration/biopsy. Patient awake and alert sitting in bed with no complaints at this time. Denies fever, chills, chest pain, dyspnea, abdominal pain, N/V, dizziness, or headache.  Patient is currently receiving Heparin 5000 U SQ injections Q8H.   Past Medical History:  Diagnosis Date  . Anemia     History reviewed. No pertinent surgical history.  Allergies: Penicillins  Medications: Prior to Admission medications   Medication Sig Start Date End Date Taking? Authorizing Provider  ibuprofen (ADVIL,MOTRIN) 200 MG tablet Take 800 mg by mouth every 6 (six) hours as needed for moderate pain.   Yes [provider]  naproxen sodium (ALEVE) 220 MG tablet Take 220 mg by mouth 2 (two) times daily as needed (pain).   Yes [provider]  ferrous sulfate 325 (65 FE) MG tablet Take 1 tablet (325 mg total) by mouth daily. Patient not taking: Reported on 08/03/2018 07/04/18   Davonna Belling, MD     Family History  Problem Relation Age of Onset  . Diabetes Mellitus II Father   . Hypertension Father   . Congestive Heart Failure Maternal Grandmother   . Diabetes Mellitus II Maternal Grandmother   . Diabetes Mellitus  II Maternal Uncle   . Congestive Heart Failure Maternal Uncle     Social History   Socioeconomic History  . Marital status: Legally Separated    Spouse name: Not on file  . Number of children: Not on file  . Years of education: Not on file  . Highest education level: Not on file  Occupational History  . Not on file  Social Needs  . Financial resource strain: Not on file  . Food insecurity:    Worry: Not on file    Inability: Not on file  . Transportation needs:    Medical: Not on file    Non-medical: Not on file  Tobacco Use  . Smoking status: Never Smoker  . Smokeless tobacco: Never Used  Substance and Sexual Activity  . Alcohol use: No  . Drug use: No    Comment: Drinks tea  . Sexual activity: Not on file  Lifestyle  . Physical activity:    Days per week: Not on file    Minutes per session: Not on file  . Stress: Not on file  Relationships  . Social connections:    Talks on phone: Not on file    Gets together: Not on file    Attends religious service: Not on file    Active member of club or organization: Not on file    Attends meetings of clubs or organizations: Not on file    Relationship status: Not on file  Other Topics Concern  . Not on file  Social History Narrative  . Not on file  Review of Systems: A 12 point ROS discussed and pertinent positives are indicated in the HPI above.  All other systems are negative.  Review of Systems  Constitutional: Negative for chills and fever.  Respiratory: Negative for shortness of breath and wheezing.   Cardiovascular: Negative for chest pain and palpitations.  Gastrointestinal: Negative for abdominal pain, nausea and vomiting.  Neurological: Negative for dizziness and headaches.  Psychiatric/Behavioral: Negative for behavioral problems and confusion.    Vital Signs: BP 119/73 (BP Location: Right Arm)   Pulse (!) 108   Temp 99.9 F (37.7 C) (Oral)   Resp 16   Ht 5\' 7"  (1.702 m)   Wt 158 lb 4.6 oz (71.8  kg)   LMP 07/26/2018   SpO2 98%   BMI 24.79 kg/m   Physical Exam  Constitutional: She is oriented to person, place, and time. She appears well-developed and well-nourished. No distress.  Cardiovascular: Normal rate, regular rhythm and normal heart sounds.  No murmur heard. Pulmonary/Chest: Effort normal and breath sounds normal. No respiratory distress. She has no wheezes.  Neurological: She is alert and oriented to person, place, and time.  Skin: Skin is warm and dry.  Psychiatric: She has a normal mood and affect. Her behavior is normal. Judgment and thought content normal.  Nursing note and vitals reviewed.    MD Evaluation Airway: WNL Heart: WNL Abdomen: WNL Chest/ Lungs: WNL ASA  Classification: 2 Mallampati/Airway Score: One   Imaging: Dg Chest 2 View  Result Date: 08/03/2018 CLINICAL DATA:  Fever with cough for 1 week. Nausea and vomiting. EXAM: CHEST - 2 VIEW COMPARISON:  None. FINDINGS: Normal heart size and pulmonary vascularity. Right paratracheal and left AP window mediastinal masses likely representing prominent lymphadenopathy. Consider lymphoma. CT chest suggested for further characterization. Lungs are clear and expanded. No blunting of costophrenic angles. No pneumothorax. IMPRESSION: Right paratracheal and left AP window masses likely representing lymphadenopathy. Consider CT chest for further characterization. Electronically Signed   By: Lucienne Capers M.D.   On: 08/03/2018 23:21   Ct Chest W Contrast  Result Date: 08/04/2018 CLINICAL DATA:  Mediastinal mass on chest radiography, for further characterization. EXAM: CT CHEST WITH CONTRAST TECHNIQUE: Multidetector CT imaging of the chest was performed during intravenous contrast administration. CONTRAST:  5mL OMNIPAQUE IOHEXOL 300 MG/ML  SOLN COMPARISON:  08/03/2018 FINDINGS: Cardiovascular: There is considerable extrinsic mass effect on the superior vena cava by the mediastinal mass, lowering the cross-sectional  area to about 0.55 cm^2 on image 32/2, with some irregularity along the caval margin which could conceivably represent invasion of the cava or a small amount of marginal thrombus. No large acute pulmonary embolus although today's exam was not timed or performed to assess specifically for pulmonary embolus. There is narrowing of the left upper lobe pulmonary artery due to the mass. Mediastinum/Nodes: Confluent and irregular mediastinal mass also extending into the left hilum and questionably involving the periphery of the left lung/pleural margin. A representative measurement on image 48/2 measures 10.3 by 6.0 cm. The mass extends around much of the aortic arch and portions the branch vasculature and abuts the left pulmonary artery. Extension in the anterior mediastinum has mass effect on the SVC is noted above. Both internal mammary arteries course through this tumor. The tumor involves the AP window. Left infrahilar node 1.2 cm in short axis on image 61/2. Pathologic supraclavicular adenopathy including a 1.2 cm node on image 9/2. A lower neck level IV lymph node measures 1.4 cm in short axis on  image 1/2. Right supraclavicular node 1.0 cm in short axis, image 9/2. Pathologic left axillary and subpectoral adenopathy, index axillary node 2.2 cm in short axis on image 18/2. An upper mediastinal node below the thyroid gland measures 1.9 cm in short axis on image 16/2. Lungs/Pleura: Tumor extends around the left upper lobe tracheobronchial tree centrally. A lung nodule in the left upper lobe medially measures 1.0 by 1.1 cm on image 38/4. Tumor tracks along the pleural space below the left first rib as shown on image 22/2, measuring about 1.7 cm anterior-posterior. Upper Abdomen: Nodular heterogeneous enhancement in the spleen favoring tumor involvement. An index lesion measures 2.5 by 1.9 cm on image 122/2, but multiple splenic nodules are present. There is likely a lymph node in the splenic hilum on image 133/2, the  bottom most image. Porta hepatis adenopathy identified including a 1.3 cm peripancreatic node on image 132/2 and a portacaval node measuring 1.5 cm in short axis on image 133/2. A lymph node between the right hemidiaphragmatic crus and the IVC measures 1.2 cm in short axis also on image 132. Hypodense lesion medially in the right hepatic lobe on image 127/2 measures 1.6 by 1.3 cm. Questionable hypodense lesion peripherally in the right hepatic lobe about 0.9 cm in diameter on image 116/2. Musculoskeletal: Permeative destruction of the sternal manubrium, with tumor and/or hematoma along the anterior and posterior manubrial margins. The tumor rind anterior to the manubrium measures up to about 1.4 cm in thickness on image 97/6. IMPRESSION: 1. 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 In addition there are multiple nodular lesions in the spleen and possibly in the liver, as well as permeative destruction of the sternal manubrium with tumor rind anterior and posterior to the sternum. 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 Left-sided small cell lung cancer might give a similar appearance. Aggressive germ-cell tumor or thymic carcinoma or less likely differential diagnostic considerations. Tissue diagnosis recommended. 2. The anterior mediastinal mass is causing narrowing of the SVC, and mild invasion of the SVC with slight marginal thrombosis is not readily excluded. No large pulmonary embolus is identified. Electronically Signed   By: Van Clines M.D.   On: 08/04/2018 01:22   [place] Core Biopsy (lymph Nodes)  Result Date: 08/04/2018 INDICATION: No known primary, now with anterior mediastinal mass and left supraclavicular lymphadenopathy worrisome for lymphoma. Please perform ultrasound-guided supraclavicular lymph node biopsy for tissue  diagnostic purposes. EXAM: ULTRASOUND-GUIDED LEFT SUPRACLAVICULAR LYMPH NODE BIOPSY COMPARISON:  Chest CT-earlier same day MEDICATIONS: None ANESTHESIA/SEDATION: Moderate (conscious) sedation was employed during this procedure. A total of Versed 2 mg and Fentanyl 100 mcg was administered intravenously. Moderate Sedation Time: 10 minutes. The patient's level of consciousness and vital signs were monitored continuously by radiology nursing throughout the procedure under my direct supervision. COMPLICATIONS: None immediate. TECHNIQUE: Informed written consent was obtained from the patient after a discussion of the risks, benefits and alternatives to treatment. Questions regarding the procedure were encouraged and answered. Initial ultrasound scanning demonstrated multiple pathologically enlarged left cervical lymph nodes. Dominant nodal conglomeration within the left supraclavicular fossa measuring approximately 1.3 x 1.5 cm was targeted for biopsy given lymph node location and sonographic window (image 14). An ultrasound image was saved for documentation purposes. The procedure was planned. A timeout was performed prior to the initiation of the procedure. The operative was prepped and draped in the usual sterile  fashion, and a sterile drape was applied covering the operative field. A timeout was performed prior to the initiation of the procedure. Local anesthesia was provided with 1% lidocaine with epinephrine. Under direct ultrasound guidance, an 18 gauge core needle device was utilized to obtain to obtain 6 core needle biopsies of the dominant left supraclavicular nodal conglomeration. The samples were placed in saline and submitted to pathology. The needle was removed and hemostasis was achieved with manual compression. Post procedure scan was negative for significant hematoma. A dressing was placed. The patient tolerated the procedure well without immediate postprocedural complication. IMPRESSION: Technically  successful ultrasound guided biopsy of dominant left supraclavicular nodal conglomeration. Electronically Signed   By: Sandi Mariscal M.D.   On: 08/04/2018 16:50    Labs:  CBC: Recent Labs    07/04/18 1618 08/03/18 2323 08/04/18 0402 08/05/18 0431  WBC 4.6 3.6* 3.0* 2.9*  HGB 9.8* 8.6* 8.3* 7.7*  HCT 32.3* 29.3* 28.7* 26.6*  PLT 433* 451* 459* 417*    COAGS: Recent Labs    08/04/18 0402  INR 1.34  APTT 45*    BMP: Recent Labs    07/04/18 1618 08/03/18 2323 08/04/18 0551 08/05/18 0431  NA 137 136 140 143  K 3.6 3.7 3.5 4.2  CL 103 103 106 111  CO2 27 25 26 22   GLUCOSE 120* 107* 95 103*  BUN 12 9 8 6   CALCIUM 8.5* 8.4* 8.0* 7.9*  CREATININE 0.81 0.81 0.69 0.64  GFRNONAA >60 >60 >60 >60  GFRAA >60 >60 >60 >60    LIVER FUNCTION TESTS: Recent Labs    08/03/18 2323 08/04/18 0551 08/05/18 0431  BILITOT 0.6 0.6 0.3  AST 17 15 14*  ALT 13 11 10   ALKPHOS 61 51 45  PROT 7.5 6.8 6.2*  ALBUMIN 3.2* 2.7* 2.5*    TUMOR MARKERS: No results for input(s): AFPTM, CEA, CA199, CHROMGRNA in the last 8760 hours.  Assessment and Plan:  Hodgkin's lymphoma. Plan for image-guided bone marrow aspiration/biopsy tentatively for 08/06/2018 with Dr. Laurence Ferrari. Patient will be NPO at midnight. Afebrile. Patient is currently receiving Heparin 5000 U SQ injections Q8H- will hold after 2200 dose this evening. INR 1.34 seconds 08/04/2018.  Risks and benefits discussed with the patient including, but not limited to bleeding, infection, damage to adjacent structures or low yield requiring additional tests. All of the patient's questions were answered, patient is agreeable to proceed. Consent signed and in chart.   Thank you for this interesting consult.  I greatly enjoyed meeting Cassandra Clark and look forward to participating in their care.  A copy of this report was sent to the requesting provider on this date.  Electronically Signed: Earley Abide, PA-C 08/05/2018, 3:03  PM   I spent a total of 20 Minutes in face to face in clinical consultation, greater than 50% of which was counseling/coordinating care for Hodgkin's lymphoma.

## 2018-08-05 NOTE — Progress Notes (Signed)
Oncology Short note  Patient was seen last evening. Bulky mediastinal disease with axillary and supraclavicular LN's. Near normal LDH Sed rate pending  In this patient Hodgkins lymphoma most likely diagnosis.  Called and talked with Dr Butler/Pathology-- likely Hodgkins- confirmatory diagnosis would be available later today Would recommend PLAN -transfuse PRBC for symptomatic anemia -will start on high dose steroids pending urgent consideration of chemotherapy -PFTs -ECHO -B12 replacement -rad onc consulted - discussed with Georjean Mode - no overt symptoms of SVC syndrome- likely given chronicity of symptoms from June 2019 and collateralization. -will need PET/CT to complete staging (outpatient) -will continue to follow. -appreciate help by hospitalist team.  Sullivan Lone MD MS

## 2018-08-05 NOTE — Consult Note (Signed)
Radiation Oncology         [phone] ________________________________  Name: Cassandra Clark        MRN: [id number]  Date of Service: 08/05/18 DOB: [birth year]  VZ:CHYIFOY, No Pcp Per  No ref. provider found     REFERRING PHYSICIAN: No ref. provider found   DIAGNOSIS: The primary encounter diagnosis was Mediastinal mass. Diagnoses of Lymphadenopathy and Fatigue were also pertinent to this visit.   HISTORY OF PRESENT ILLNESS: Cassandra Clark is a 37 y.o. female seen at the request of Dr. Alfredia Ferguson for a mediastinal mass concerning for lymphoma. The patient was symptomatic with a fever for several days and was concerned because she had a coughing episode at work and episode of emesis during that coughing episode. She was seen in the ED on 08/03/18 and a CXR revealed concerns for a possible mediastinal mass. A CT of the chest revealed a 10 x 6 cm mass in the mediastinum  Extending aroudn the aortic arch and in the anterior part of the mediastinum there was mass effect on the SVC, internal mammary arteries course through the tumor and involves the AP window.  There is pathologic supraclavicular adenopathy measuring 1.2 cm, and a lower level 4 node was 1.4 cm, right supraclavicular node 1 cm, pathologic left axillary and subpectoral adenopathy measuring 2.2 cm was noted.  An upper mediastinal node below the thyroid gland measured 1.9 cm, and in the lungs the tumor in her mediastinum extending around the left upper lobe tracheobronchial tree centrally, with concerns for lowering the cross-sectional area of the vena cava 2.55 cm.  We are asked to see her today to discuss options of radiotherapy.    PREVIOUS RADIATION THERAPY: No   PAST MEDICAL HISTORY:  Past Medical History:  Diagnosis Date  . Anemia        PAST SURGICAL HISTORY:History reviewed. No pertinent surgical history.   FAMILY HISTORY:  Family History  Problem Relation Age of Onset  . Diabetes Mellitus II Father   . Hypertension  Father   . Congestive Heart Failure Maternal Grandmother   . Diabetes Mellitus II Maternal Grandmother   . Diabetes Mellitus II Maternal Uncle   . Congestive Heart Failure Maternal Uncle      SOCIAL HISTORY:  reports that she has never smoked. She has never used smokeless tobacco. She reports that she does not drink alcohol or use drugs. The patient lives in [place]. She has two children, a son and daughter. 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   ALLERGIES: Penicillins   MEDICATIONS:  Current Facility-Administered Medications  Medication Dose Route Frequency Provider Last Rate Last Dose  . 0.9 %  sodium chloride infusion   Intravenous Continuous Reubin Milan, MD 100 mL/hr at 08/05/18 0200    . acetaminophen (TYLENOL) tablet 650 mg  650 mg Oral Q6H PRN Reubin Milan, MD   650 mg at 08/04/18 2129   Or  . acetaminophen (TYLENOL) suppository 650 mg  650 mg Rectal Q6H PRN Reubin Milan, MD      . cyanocobalamin ((VITAMIN B-12)) injection 1,000 mcg  1,000 mcg Intramuscular Q24H Reubin Milan, MD   1,000 mcg at 08/04/18 2328  . heparin injection 5,000 Units  5,000 Units Subcutaneous Q8H Thomes Lolling, [facility]   5,000 Units at 08/05/18 7741  . morphine 4 MG/ML injection 4 mg  4 mg Intravenous Q4H PRN Reubin Milan, MD      . promethazine [facility]) injection 12.5 mg  12.5 mg Intravenous Q4H PRN Reubin Milan, MD      . traZODone (DESYREL) tablet 50 mg  50 mg Oral QHS PRN Reubin Milan, MD         REVIEW OF SYSTEMS: On review of systems, the patient reports that she is doing well overall. She reports she's had fevers most days in the evenings for about a week with Tmax of 103. She had been taking tylenol prior to this due to a headache. She feels this supressed her fever and reports no unintended weight changes. She reports shortness of breath with exertion and with walking.  She denies any bowel or bladder disturbances, and denies abdominal pain, nausea or  vomiting. She denies any new musculoskeletal or joint aches or pains. A complete review of systems is obtained and is otherwise negative.     PHYSICAL EXAM:  Wt Readings from Last 3 Encounters:  08/04/18 158 lb 4.6 oz (71.8 kg)  07/04/18 160 lb (72.6 kg)  12/16/16 130 lb (59 kg)   Temp Readings from Last 3 Encounters:  08/05/18 99.7 F (37.6 C) (Oral)  07/04/18 99.8 F (37.7 C) (Oral)  12/16/16 99.4 F (37.4 C) (Oral)   BP Readings from Last 3 Encounters:  08/05/18 103/74  07/04/18 113/64  12/16/16 (!) 100/53   Pulse Readings from Last 3 Encounters:  08/05/18 99  07/04/18 (!) 115  12/16/16 110   Pain Assessment Pain Score: 3 /10  In general this is a well appearing African American female in no acute distress. Skin is intact without any evidence of gross lesions. She has palpable fullness in the left supraclavicular neck, and fullness of the sternum with collateral vessels noted. No JVD was noted, but she has shotty adenopathy in the cervical chain on the left. Axillary evaluation is limited due to body habitus, but I do not feel any distinct adenopathy.   ECOG = 1  0 - Asymptomatic (Fully active, able to carry on all predisease activities without restriction)  1 - Symptomatic but completely ambulatory (Restricted in physically strenuous activity but ambulatory and able to carry out work of a light or sedentary nature. For example, light housework, office work)  2 - Symptomatic, <50% in bed during the day (Ambulatory and capable of all self care but unable to carry out any work activities. Up and about more than 50% of waking hours)  3 - Symptomatic, >50% in bed, but not bedbound (Capable of only limited self-care, confined to bed or chair 50% or more of waking hours)  4 - Bedbound (Completely disabled. Cannot carry on any self-care. Totally confined to bed or chair)  5 - Death   Eustace Pen MM, Creech RH, Tormey DC, et al. [phone]). "Toxicity and response criteria of the  [facility]". [place] Oncol. 5 (6): 649-55    LABORATORY DATA:  Lab Results  Component Value Date   WBC 2.9 (L) 08/05/2018   HGB 7.7 (L) 08/05/2018   HCT 26.6 (L) 08/05/2018   MCV 79.4 (L) 08/05/2018   PLT 417 (H) 08/05/2018   Lab Results  Component Value Date   NA 143 08/05/2018   K 4.2 08/05/2018   CL 111 08/05/2018   CO2 22 08/05/2018   Lab Results  Component Value Date   ALT 10 08/05/2018   AST 14 (L) 08/05/2018   ALKPHOS 45 08/05/2018   BILITOT 0.3 08/05/2018      RADIOGRAPHY: Dg Chest 2 View  Result Date: 08/03/2018 CLINICAL DATA:  Fever with cough for 1 week. Nausea and vomiting. EXAM: CHEST - 2 VIEW COMPARISON:  None. FINDINGS: Normal heart size and pulmonary vascularity. Right paratracheal and left AP window mediastinal masses likely representing prominent lymphadenopathy. Consider lymphoma. CT chest suggested for further characterization. Lungs are clear and expanded. No blunting of costophrenic angles. No pneumothorax. IMPRESSION: Right paratracheal and left AP window masses likely representing lymphadenopathy. Consider CT chest for further characterization. Electronically Signed   By: Lucienne Capers M.D.   On: 08/03/2018 23:21   Ct Chest W Contrast  Result Date: 08/04/2018 CLINICAL DATA:  Mediastinal mass on chest radiography, for further characterization. EXAM: CT CHEST WITH CONTRAST TECHNIQUE: Multidetector CT imaging of the chest was performed during intravenous contrast administration. CONTRAST:  15m OMNIPAQUE IOHEXOL 300 MG/ML  SOLN COMPARISON:  08/03/2018 FINDINGS: Cardiovascular: There is considerable extrinsic mass effect on the superior vena cava by the mediastinal mass, lowering the cross-sectional area to about 0.55 cm^2 on image 32/2, with some irregularity along the caval margin which could conceivably represent invasion of the cava or a small amount of marginal thrombus. No large acute pulmonary embolus although today's exam  was not timed or performed to assess specifically for pulmonary embolus. There is narrowing of the left upper lobe pulmonary artery due to the mass. Mediastinum/Nodes: Confluent and irregular mediastinal mass also extending into the left hilum and questionably involving the periphery of the left lung/pleural margin. A representative measurement on image 48/2 measures 10.3 by 6.0 cm. The mass extends around much of the aortic arch and portions the branch vasculature and abuts the left pulmonary artery. Extension in the anterior mediastinum has mass effect on the SVC is noted above. Both internal mammary arteries course through this tumor. The tumor involves the AP window. Left infrahilar node 1.2 cm in short axis on image 61/2. Pathologic supraclavicular adenopathy including a 1.2 cm node on image 9/2. A lower neck level IV lymph node measures 1.4 cm in short axis on image 1/2. Right supraclavicular node 1.0 cm in short axis, image 9/2. Pathologic left axillary and subpectoral adenopathy, index axillary node 2.2 cm in short axis on image 18/2. An upper mediastinal node below the thyroid gland measures 1.9 cm in short axis on image 16/2. Lungs/Pleura: Tumor extends around the left upper lobe tracheobronchial tree centrally. A lung nodule in the left upper lobe medially measures 1.0 by 1.1 cm on image 38/4. Tumor tracks along the pleural space below the left first rib as shown on image 22/2, measuring about 1.7 cm anterior-posterior. Upper Abdomen: Nodular heterogeneous enhancement in the spleen favoring tumor involvement. An index lesion measures 2.5 by 1.9 cm on image 122/2, but multiple splenic nodules are present. There is likely a lymph node in the splenic hilum on image 133/2, the bottom most image. Porta hepatis adenopathy identified including a 1.3 cm peripancreatic node on image 132/2 and a portacaval node measuring 1.5 cm in short axis on image 133/2. A lymph node between the right hemidiaphragmatic crus and  the IVC measures 1.2 cm in short axis also on image 132. Hypodense lesion medially in the right hepatic lobe on image 127/2 measures 1.6 by 1.3 cm. Questionable hypodense lesion peripherally in the right hepatic lobe about 0.9 cm in diameter on image 116/2. Musculoskeletal: Permeative destruction of the sternal manubrium, with tumor and/or hematoma along the anterior and posterior manubrial margins. The tumor rind anterior to the manubrium measures up to about 1.4 cm in thickness on image 97/6. IMPRESSION: 1. Large primarily  anterior mediastinal mass extending to the left hilum, with notable mediastinal, left axillary and subpectoral, supraclavicular, and porta hepatis/upper abdominal adenopathy. In addition there are multiple nodular lesions in the spleen and possibly in the liver, as well as permeative destruction of the sternal manubrium with tumor rind anterior and posterior to the sternum. 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 Left-sided small cell lung cancer might give a similar appearance. Aggressive germ-cell tumor or thymic carcinoma or less likely differential diagnostic considerations. Tissue diagnosis recommended. 2. The anterior mediastinal mass is causing narrowing of the SVC, and mild invasion of the SVC with slight marginal thrombosis is not readily excluded. No large pulmonary embolus is identified. Electronically Signed   By: Van Clines M.D.   On: 08/04/2018 01:22   [place] Core Biopsy (lymph Nodes)  Result Date: 08/04/2018 INDICATION: No known primary, now with anterior mediastinal mass and left supraclavicular lymphadenopathy worrisome for lymphoma. Please perform ultrasound-guided supraclavicular lymph node biopsy for tissue diagnostic purposes. EXAM: ULTRASOUND-GUIDED LEFT SUPRACLAVICULAR LYMPH NODE BIOPSY COMPARISON:  Chest CT-earlier same day MEDICATIONS: None ANESTHESIA/SEDATION: Moderate (conscious) sedation was employed during this procedure. A total of  Versed 2 mg and Fentanyl 100 mcg was administered intravenously. Moderate Sedation Time: 10 minutes. The patient's level of consciousness and vital signs were monitored continuously by radiology nursing throughout the procedure under my direct supervision. COMPLICATIONS: None immediate. TECHNIQUE: Informed written consent was obtained from the patient after a discussion of the risks, benefits and alternatives to treatment. Questions regarding the procedure were encouraged and answered. Initial ultrasound scanning demonstrated multiple pathologically enlarged left cervical lymph nodes. Dominant nodal conglomeration within the left supraclavicular fossa measuring approximately 1.3 x 1.5 cm was targeted for biopsy given lymph node location and sonographic window (image 14). An ultrasound image was saved for documentation purposes. The procedure was planned. A timeout was performed prior to the initiation of the procedure. The operative was prepped and draped in the usual sterile fashion, and a sterile drape was applied covering the operative field. A timeout was performed prior to the initiation of the procedure. Local anesthesia was provided with 1% lidocaine with epinephrine. Under direct ultrasound guidance, an 18 gauge core needle device was utilized to obtain to obtain 6 core needle biopsies of the dominant left supraclavicular nodal conglomeration. The samples were placed in saline and submitted to pathology. The needle was removed and hemostasis was achieved with manual compression. Post procedure scan was negative for significant hematoma. A dressing was placed. The patient tolerated the procedure well without immediate postprocedural complication. IMPRESSION: Technically successful ultrasound guided biopsy of dominant left supraclavicular nodal conglomeration. Electronically Signed   By: Sandi Mariscal M.D.   On: 08/04/2018 16:50       IMPRESSION/PLAN: 1. Lymphoma, IHC pending. I met today with the patient  to review the findings radiographically. Pathology has preliminary information to support lymphomatous disease, either Hodgkin's disease or B-cell Lymphoma. 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  We discussed the risks, benefits, short, and long term effects of radiotherapy, and the patient is interested in proceeding. We discussed a course of 3 weeks of radiotherapy. We will plan for simulation this morning with mark and start treatment this afternoon. Written consent is obtained and placed in the chart, a copy was provided to the patient.  In a visit lasting 70 minutes, greater than 50% of the time was spent face to face discussing her  case, and coordinating the patient's care.    Carola Rhine, PAC  Addendum:  I called medical oncology for consult, and Dr. Irene Limbo has already seen the patient overnight. Given good performance status and clinical appearance, he requests we hold off on treatment. He will proceed with high dose steroids instead and begin systemic therapy in the near future. Her IHC should result this afternoon. I called the patient's room to let her know the change in plans. I asked her to leave her simulation markings in place for at least one week just in case her clinical picture worsened acutely if radiation was necessary.     Carola Rhine, PAC

## 2018-08-05 NOTE — Plan of Care (Signed)
Patient without complaint during 7 a to 7 p shift, T max 100.1.  Multiple family members at bedside.

## 2018-08-05 NOTE — Progress Notes (Signed)
  Echocardiogram 2D Echocardiogram has been performed.  Darlina Sicilian M 08/05/2018, 11:03 AM

## 2018-08-05 NOTE — Progress Notes (Signed)
PROGRESS NOTE    Cassandra Clark  TDD:[id number] DOB: 12-08-1980 DOA: 08/03/2018 PCP: Patient, No Pcp Per   Brief Narrative:  The patient is a 37 year old African-American female with past medical history significant for anemia presented to the emergency department with complaints of a new onset fever and 2 weeks history of night sweats, dry cough, fatigue, decreased appetite and unintentional weight loss.  Also has been complaining of having intermittent headaches and she was seen in the ER about a month ago for this.    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  In addition there is also multiple nodular lesions in the spleen and possibly the liver as well as permeative destruction of the sternal manubrium with tumor rind anterior and posterior to the sternum findings were concerning for a aggressive lymphoma versus a left-sided small cell lung cancer or a an aggressive germ cell tumor or thymic carcinoma which are less likely in the differential.  This anterior mediastinal mass was causing narrowing of the SVC with mild elevation of the SVC slight marginal thrombosis not ready excluded and there is no PE identified.    I discussed the case with Dr. Cyndia Bent of Cardiothoracic surgery who recommended obtaining an IR consult for supraclavicular nodecore needle biopsy and was done on 08/04/18. Dr. Irene Limbo of Oncology was consulted for further evaluation and recommendations as well as radiation oncology.  Initial evaluation likely reveals Hodgkin's lymphoma but pathology still pending and oncology has initiated further work-up to guide treatment.  Assessment & Plan:   Principal Problem:   Superior vena cava syndrome Active Problems:   Hypophosphatemia   Anemia   Leukopenia   Lymphoma (HCC)  Mediastinal Mass with Bulky mediastinal  Disease and Axillary Suprclavicular LN's likely Hodgkin's Lymphoma Concern for SVC Syndrome -There is no facial plethora, chest pain, dyspnea, vascular or any neurological manifestations. -Admitted to telemetry/inpatient. -Supplemental oxygen as needed. -Cough suppressants and antiemetics as needed. -Check W23, folic acid, iron studies, LDH, CEA, uric acid. -Check HIV, hep B, hep C. Check beta-2 microglobulin 7 protein electrophoresis. -Oncology consulted and appreciate further evaluation and recommendations -Will not start full dose anticoagulation in the absence of clear thrombosis evidence but will defer to Oncology -Patient had a biopsy of the supraclavicular lymph node yesterday and confirmatory diagnosis is pending with likely Hodgkin's lymphoma -Medical oncology is started patient on high-dose steroids pending urgent consideration chemotherapy -PFTs are also being done as well as an echocardiogram -Patient will need a PET/CT to complete staging as an outpatient however Dr. Irene Limbo has ordered CT-guided bone marrow aspiration and biopsy for Hodgkin's lymphoma staging -CT of the abdomen pelvis with contrast is also been ordered for initial work-up -LDH slightly elevated at 203 -Radiation Oncology consulted for radiotherapy for related to the large masses and concern for SVC syndrome and patient was going to be started on 3 weeks of radiotherapy but medical oncology requests holding off on treatment and proceeding with high-dose steroids initially begin systematic therapy in the future as she has no overt sympotoms of SVC Syndrome -Radiation Oncology is available if her clinical picture acutely worsens and if radiation is necessary -Appreciate Oncology evaluation further recommendations.  Hypophosphatemia -Improved -Patient's phosphorus level went from 2.0 and is now 2.8 -Continue monitor and replete as necessary -Repeat phosphorus level in a.m.  Microcytic Anemia -In the setting of  suspected lymphoma and dilutional drop  sign -Patient's hemoglobin/hematocrit went from 8.6/29.3 on admission is now 7.7/26.6 -Anemia panel checked and showed an iron level 23, U IBC of 173, TIBC of 196, saturation ratios of 12%, ferritin level of 91, folate level 11.5, vitamin B12 level 135 -Vitabm B 12 being supplemented and patient being given cyanocobalamin thousand micrograms IM every 24 hours for 7 days and started on a B complex vitamin C 1 tablet p.o. daily -Check FOBT which is still currently pending to be done -Continue need to monitor for signs and symptoms of bleeding -Transfuse for symptomatic anemia and hemoglobin less than 7 -Repeat CBC in the a.m.  Leukopenia -Likely secondary to likely Hodgkin's Lymphoma. -WBC trended down and went from 3.6 on admission to 2.9 -Continue to Monitor and Trend and repeat CBC in AM  -Oncology following and appreciate further evaluation and recommendations -Patient to undergo CT Guided iliac Crest Bone marrow aspiration and Biopsy  Hyperglycemia -Patient's blood sugar was 107 on admission and blood sugars have been ranging from 95-107 on CMP's -Check hemoglobin A1c -If blood sugars remain consistently elevated will place on sensitive NovoLog sliding scale AC and expect them to go up in the setting of steroids  Thrombocytosis -Patient's platelet count on admission was 451 is now trended down to 470 -Continue monitor and repeat CBC in a.m.  DVT prophylaxis: Heparin 5,000 units sq q8h Code Status: FULL CODE Family Communication: Discussed with Family at bedside Disposition Plan: Remain Inpatient for current treatment and workup  Consultants:   Medical Oncology  Radiation Oncology  Discussed Case with [facility] Dr. Cyndia Bent   Interventional Radiology   Procedures:  U/S Guided Biopsy of Dominal Left Supraclavicular Nodal Conglomeration  ECHOCARDIOGRAM  ------------------------------------------------------------------- Study  Conclusions  - Left ventricle: The cavity size was normal. There was mild focal   basal hypertrophy of the septum. Systolic function was vigorous.   The estimated ejection fraction was in the range of 65% to 70%.   Wall motion was normal; there were no regional wall motion   abnormalities. Left ventricular diastolic function parameters   were normal. - Pericardium, extracardiac: There was no significant pericardial   effusion.  Antimicrobials:  Anti-infectives (From admission, onward)   None     Subjective: Seen and examined at bedside states that she had a fever last night and feels okay today.  No chest pain, lightheadedness or dizziness.  No nausea or vomiting.  Is breathing okay and not wearing any supplemental oxygen via nasal cannula.  Understands that she needs further work-up.  No other concerns or complaints at this time  Objective: Vitals:   08/05/18 0120 08/05/18 0508 08/05/18 0854 08/05/18 1251  BP:  103/74  119/73  Pulse:  99  (!) 108  Resp:    16  Temp: 99 F (37.2 C) 99.7 F (37.6 C)  99.9 F (37.7 C)  TempSrc: Oral Oral  Oral  SpO2:  100% 98% 98%  Weight:      Height:        Intake/Output Summary (Last 24 hours) at 08/05/2018 1318 Last data filed at 08/05/2018 1200 Gross per 24 hour  Intake 3183.85 ml  Output 600 ml  Net 2583.85 ml   Filed Weights   08/03/18 2143 08/04/18 0457  Weight: 72.6 kg 71.8 kg   Examination: Physical Exam:  Constitutional: WN/WD AAF in NAD and appears calm and comfortable Eyes: Lids and conjunctivae normal, sclerae anicteric  ENMT: External Ears, Nose appear normal. Grossly normal hearing. Mucous membranes are moist.  Neck: Appears  normal, supple, no cervical masses, normal ROM, no appreciable thyromegaly, no JVD Respiratory: Diminished to auscultation bilaterally, no wheezing, rales, rhonchi or crackles. Normal respiratory effort and patient is not tachypenic. No accessory muscle use.  Cardiovascular: RRR, no murmurs /  rubs / gallops. S1 and S2 auscultated. No extremity edema. Abdomen: Soft, non-tender, non-distended. No masses palpated. No appreciable hepatosplenomegaly. Bowel sounds positive x4.  GU: Deferred. Musculoskeletal: No clubbing / cyanosis of digits/nails. No joint deformity upper and lower extremities.   Skin: No rashes, lesions, ulcers on a limited skin evaluation. No induration; Warm and dry.  Neurologic: CN 2-12 grossly intact with no focal deficits.. Romberg sign and cerebellar reflexes not assessed.  Psychiatric: Normal judgment and insight. Alert and oriented x 3. Normal mood and appropriate affect.   Data Reviewed: I have personally reviewed following labs and imaging studies  CBC: Recent Labs  Lab 08/03/18 2323 08/04/18 0402 08/05/18 0431  WBC 3.6* 3.0* 2.9*  NEUTROABS 2.5 1.9 1.9  HGB 8.6* 8.3* 7.7*  HCT 29.3* 28.7* 26.6*  MCV 77.7* 78.8* 79.4*  PLT 451* 459* 185*   Basic Metabolic Panel: Recent Labs  Lab 08/03/18 2323 08/04/18 0551 08/05/18 0431  NA 136 140 143  K 3.7 3.5 4.2  CL 103 106 111  CO2 25 26 22   GLUCOSE 107* 95 103*  BUN 9 8 6   CREATININE 0.81 0.69 0.64  CALCIUM 8.4* 8.0* 7.9*  MG 1.9 2.0 2.0  PHOS 2.0* 2.7 2.8   GFR: Estimated Creatinine Clearance: 91.8 mL/min (by C-G formula based on SCr of 0.64 mg/dL). Liver Function Tests: Recent Labs  Lab 08/03/18 2323 08/04/18 0551 08/05/18 0431  AST 17 15 14*  ALT 13 11 10   ALKPHOS 61 51 45  BILITOT 0.6 0.6 0.3  PROT 7.5 6.8 6.2*  ALBUMIN 3.2* 2.7* 2.5*   No results for input(s): LIPASE, AMYLASE in the last 168 hours. No results for input(s): AMMONIA in the last 168 hours. Coagulation Profile: Recent Labs  Lab 08/04/18 0402  INR 1.34   Cardiac Enzymes: No results for input(s): CKTOTAL, CKMB, CKMBINDEX, TROPONINI in the last 168 hours. BNP (last 3 results) No results for input(s): PROBNP in the last 8760 hours. HbA1C: No results for input(s): HGBA1C in the last 72 hours. CBG: No results  for input(s): GLUCAP in the last 168 hours. Lipid Profile: No results for input(s): CHOL, HDL, LDLCALC, TRIG, CHOLHDL, LDLDIRECT in the last 72 hours. Thyroid Function Tests: No results for input(s): TSH, T4TOTAL, FREET4, T3FREE, THYROIDAB in the last 72 hours. Anemia Panel: Recent Labs    08/04/18 0551  VITAMINB12 135*  FOLATE 11.5  FERRITIN 91  TIBC 196*  IRON 23*  RETICCTPCT 2.0   Sepsis Labs: No results for input(s): PROCALCITON, LATICACIDVEN in the last 168 hours.  No results found for this or any previous visit (from the past 240 hour(s)).   Radiology Studies: Dg Chest 2 View  Result Date: 08/03/2018 CLINICAL DATA:  Fever with cough for 1 week. Nausea and vomiting. EXAM: CHEST - 2 VIEW COMPARISON:  None. FINDINGS: Normal heart size and pulmonary vascularity. Right paratracheal and left AP window mediastinal masses likely representing prominent lymphadenopathy. Consider lymphoma. CT chest suggested for further characterization. Lungs are clear and expanded. No blunting of costophrenic angles. No pneumothorax. IMPRESSION: Right paratracheal and left AP window masses likely representing lymphadenopathy. Consider CT chest for further characterization. Electronically Signed   By: Lucienne Capers M.D.   On: 08/03/2018 23:21   Ct Chest W Contrast  Result Date: 08/04/2018 CLINICAL DATA:  Mediastinal mass on chest radiography, for further characterization. EXAM: CT CHEST WITH CONTRAST TECHNIQUE: Multidetector CT imaging of the chest was performed during intravenous contrast administration. CONTRAST:  52mL OMNIPAQUE IOHEXOL 300 MG/ML  SOLN COMPARISON:  08/03/2018 FINDINGS: Cardiovascular: There is considerable extrinsic mass effect on the superior vena cava by the mediastinal mass, lowering the cross-sectional area to about 0.55 cm^2 on image 32/2, with some irregularity along the caval margin which could conceivably represent invasion of the cava or a small amount of marginal thrombus. No  large acute pulmonary embolus although today's exam was not timed or performed to assess specifically for pulmonary embolus. There is narrowing of the left upper lobe pulmonary artery due to the mass. Mediastinum/Nodes: Confluent and irregular mediastinal mass also extending into the left hilum and questionably involving the periphery of the left lung/pleural margin. A representative measurement on image 48/2 measures 10.3 by 6.0 cm. The mass extends around much of the aortic arch and portions the branch vasculature and abuts the left pulmonary artery. Extension in the anterior mediastinum has mass effect on the SVC is noted above. Both internal mammary arteries course through this tumor. The tumor involves the AP window. Left infrahilar node 1.2 cm in short axis on image 61/2. Pathologic supraclavicular adenopathy including a 1.2 cm node on image 9/2. A lower neck level IV lymph node measures 1.4 cm in short axis on image 1/2. Right supraclavicular node 1.0 cm in short axis, image 9/2. Pathologic left axillary and subpectoral adenopathy, index axillary node 2.2 cm in short axis on image 18/2. An upper mediastinal node below the thyroid gland measures 1.9 cm in short axis on image 16/2. Lungs/Pleura: Tumor extends around the left upper lobe tracheobronchial tree centrally. A lung nodule in the left upper lobe medially measures 1.0 by 1.1 cm on image 38/4. Tumor tracks along the pleural space below the left first rib as shown on image 22/2, measuring about 1.7 cm anterior-posterior. Upper Abdomen: Nodular heterogeneous enhancement in the spleen favoring tumor involvement. An index lesion measures 2.5 by 1.9 cm on image 122/2, but multiple splenic nodules are present. There is likely a lymph node in the splenic hilum on image 133/2, the bottom most image. Porta hepatis adenopathy identified including a 1.3 cm peripancreatic node on image 132/2 and a portacaval node measuring 1.5 cm in short axis on image 133/2. A  lymph node between the right hemidiaphragmatic crus and the IVC measures 1.2 cm in short axis also on image 132. Hypodense lesion medially in the right hepatic lobe on image 127/2 measures 1.6 by 1.3 cm. Questionable hypodense lesion peripherally in the right hepatic lobe about 0.9 cm in diameter on image 116/2. Musculoskeletal: Permeative destruction of the sternal manubrium, with tumor and/or hematoma along the anterior and posterior manubrial margins. The tumor rind anterior to the manubrium measures up to about 1.4 cm in thickness on image 97/6. IMPRESSION: 1. 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 In addition there are multiple nodular lesions in the spleen and possibly in the liver, as well as permeative destruction of the sternal manubrium with tumor rind anterior and posterior to the sternum. 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 Left-sided small cell lung cancer might give a similar appearance. Aggressive germ-cell tumor or thymic carcinoma or less likely differential diagnostic considerations. Tissue diagnosis recommended. 2. The anterior mediastinal  mass is causing narrowing of the SVC, and mild invasion of the SVC with slight marginal thrombosis is not readily excluded. No large pulmonary embolus is identified. Electronically Signed   By: Van Clines M.D.   On: 08/04/2018 01:22   [place] Core Biopsy (lymph Nodes)  Result Date: 08/04/2018 INDICATION: No known primary, now with anterior mediastinal mass and left supraclavicular lymphadenopathy worrisome for lymphoma. Please perform ultrasound-guided supraclavicular lymph node biopsy for tissue diagnostic purposes. EXAM: ULTRASOUND-GUIDED LEFT SUPRACLAVICULAR LYMPH NODE BIOPSY COMPARISON:  Chest CT-earlier same day MEDICATIONS: None ANESTHESIA/SEDATION: Moderate (conscious)  sedation was employed during this procedure. A total of Versed 2 mg and Fentanyl 100 mcg was administered intravenously. Moderate Sedation Time: 10 minutes. The patient's level of consciousness and vital signs were monitored continuously by radiology nursing throughout the procedure under my direct supervision. COMPLICATIONS: None immediate. TECHNIQUE: Informed written consent was obtained from the patient after a discussion of the risks, benefits and alternatives to treatment. Questions regarding the procedure were encouraged and answered. Initial ultrasound scanning demonstrated multiple pathologically enlarged left cervical lymph nodes. Dominant nodal conglomeration within the left supraclavicular fossa measuring approximately 1.3 x 1.5 cm was targeted for biopsy given lymph node location and sonographic window (image 14). An ultrasound image was saved for documentation purposes. The procedure was planned. A timeout was performed prior to the initiation of the procedure. The operative was prepped and draped in the usual sterile fashion, and a sterile drape was applied covering the operative field. A timeout was performed prior to the initiation of the procedure. Local anesthesia was provided with 1% lidocaine with epinephrine. Under direct ultrasound guidance, an 18 gauge core needle device was utilized to obtain to obtain 6 core needle biopsies of the dominant left supraclavicular nodal conglomeration. The samples were placed in saline and submitted to pathology. The needle was removed and hemostasis was achieved with manual compression. Post procedure scan was negative for significant hematoma. A dressing was placed. The patient tolerated the procedure well without immediate postprocedural complication. IMPRESSION: Technically successful ultrasound guided biopsy of dominant left supraclavicular nodal conglomeration. Electronically Signed   By: Sandi Mariscal M.D.   On: 08/04/2018 16:50   Scheduled Meds: .  B-complex with vitamin C  1 tablet Oral Daily  . cyanocobalamin  1,000 mcg Intramuscular Q24H  . dexamethasone  20 mg Oral QAC breakfast  . heparin  5,000 Units Subcutaneous Q8H  . iopamidol       Continuous Infusions: . sodium chloride 100 mL/hr at 08/05/18 0200    LOS: 1 day   Kerney Elbe, DO [facility] PAGER is on AMION  If 7PM-7AM, please contact night-coverage [email or web address] Password [email or web address] 08/05/2018, 1:18 PM

## 2018-08-05 NOTE — Progress Notes (Signed)
HEMATOLOGY/ONCOLOGY INPATIENT PROGRESS NOTE  Date of Service: 08/05/2018  Inpatient Attending: .Sheikh, Omair [place], DO   SUBJECTIVE:   Cassandra Clark reports that he is doing well overall.   The pt reports that she is feeling hungry after beginning Dexamethasone this morning. She is unsure if she has had any fevers since receiving Dexamethasone, but notes that last night her fever got up to 102.7. The pt adds that she is not feeling dizzy or light headed at this time.   The pt notes that she has had nausea for the last couple months.   Lab results today (11/05/17) of CBC w/diff, CMP is as follows: all values are WNL except for WBC at 2.9k, RBC at 3.35, HGB at 7.7, HCT at 26.6, MCV at 79.4, MCH at 23.0, MCHC at 28.9, RDW at 18.4, PLT at 417k, Lymphs abs at 300, Glucose at 103, Calcium at 7.9, Total Protein at 6.2, Albumin at 2.5, AST at 14. 08/05/18 Sedimentation Rate elevated at 62  On review of systems, pt reports strong appetite, stable energy levels, and denies dizziness, abdominal pains, leg swelling, and any other symptoms.    OBJECTIVE:  NAD PHYSICAL EXAMINATION: . Vitals:   08/05/18 0120 08/05/18 0508 08/05/18 0854 08/05/18 1251  BP:  103/74  119/73  Pulse:  99  (!) 108  Resp:    16  Temp: 99 F (37.2 C) 99.7 F (37.6 C)  99.9 F (37.7 C)  TempSrc: Oral Oral  Oral  SpO2:  100% 98% 98%  Weight:      Height:       Filed Weights   08/03/18 2143 08/04/18 0457  Weight: 160 lb (72.6 kg) 158 lb 4.6 oz (71.8 kg)   .Body mass index is 24.79 kg/m.  GENERAL:alert, in no acute distress and comfortable SKIN: no acute rashes, no significant lesions EYES: conjunctiva are pink and non-injected, sclera anicteric OROPHARYNX: MMM, no exudates, no oropharyngeal erythema or ulceration NECK: supple, no JVD LYMPH:  B/l supraclavicular and left axillary lymphadenopathy palpable. LUNGS: clear to auscultation b/l with normal respiratory effort HEART: regular rate &  rhythm ABDOMEN:  normoactive bowel sounds , non tender, not distended. Extremity: trace pedal edema PSYCH: alert & oriented x 3 with fluent speech NEURO: no focal motor/sensory deficits  MEDICAL HISTORY:  Past Medical History:  Diagnosis Date  . Anemia     SURGICAL HISTORY: History reviewed. No pertinent surgical history.  SOCIAL HISTORY: Social History   Socioeconomic History  . Marital status: Legally Separated    Spouse name: Not on file  . Number of children: Not on file  . Years of education: Not on file  . Highest education level: Not on file  Occupational History  . Not on file  Social Needs  . Financial resource strain: Not on file  . Food insecurity:    Worry: Not on file    Inability: Not on file  . Transportation needs:    Medical: Not on file    Non-medical: Not on file  Tobacco Use  . Smoking status: Never Smoker  . Smokeless tobacco: Never Used  Substance and Sexual Activity  . Alcohol use: No  . Drug use: No    Comment: Drinks tea  . Sexual activity: Not on file  Lifestyle  . Physical activity:    Days per week: Not on file    Minutes per session: Not on file  . Stress: Not on file  Relationships  . Social connections:  Talks on phone: Not on file    Gets together: Not on file    Attends religious service: Not on file    Active member of club or organization: Not on file    Attends meetings of clubs or organizations: Not on file    Relationship status: Not on file  . Intimate partner violence:    Fear of current or ex partner: Not on file    Emotionally abused: Not on file    Physically abused: Not on file    Forced sexual activity: Not on file  Other Topics Concern  . Not on file  Social History Narrative  . Not on file    FAMILY HISTORY: Family History  Problem Relation Age of Onset  . Diabetes Mellitus II Father   . Hypertension Father   . Congestive Heart Failure Maternal Grandmother   . Diabetes Mellitus II Maternal  Grandmother   . Diabetes Mellitus II Maternal Uncle   . Congestive Heart Failure Maternal Uncle     ALLERGIES:  is allergic to penicillins.  MEDICATIONS:  Scheduled Meds: . B-complex with vitamin C  1 tablet Oral Daily  . cyanocobalamin  1,000 mcg Intramuscular Q24H  . dexamethasone  20 mg Oral QAC breakfast  . heparin  5,000 Units Subcutaneous Q8H  . iopamidol      . sodium chloride       Continuous Infusions: . sodium chloride 100 mL/hr at 08/05/18 1321   PRN Meds:.acetaminophen **OR** acetaminophen, iopamidol, morphine injection, promethazine, traZODone  REVIEW OF SYSTEMS:    10 Point review of Systems was done is negative except as noted above.   LABORATORY DATA:  I have reviewed the data as listed  . CBC Latest Ref Rng & Units 08/05/2018 08/04/2018 08/03/2018  WBC 4.0 - 10.5 K/uL 2.9(L) 3.0(L) 3.6(L)  Hemoglobin 12.0 - 15.0 g/dL 7.7(L) 8.3(L) 8.6(L)  Hematocrit 36.0 - 46.0 % 26.6(L) 28.7(L) 29.3(L)  Platelets 150 - 400 K/uL 417(H) 459(H) 451(H)    . CMP Latest Ref Rng & Units 08/05/2018 08/04/2018 08/03/2018  Glucose 70 - 99 mg/dL 103(H) 95 107(H)  BUN 6 - 20 mg/dL 6 8 9   Creatinine 0.44 - 1.00 mg/dL 0.64 0.69 0.81  Sodium 135 - 145 mmol/L 143 140 136  Potassium 3.5 - 5.1 mmol/L 4.2 3.5 3.7  Chloride 98 - 111 mmol/L 111 106 103  CO2 22 - 32 mmol/L 22 26 25   Calcium 8.9 - 10.3 mg/dL 7.9(L) 8.0(L) 8.4(L)  Total Protein 6.5 - 8.1 g/dL 6.2(L) 6.8 7.5  Total Bilirubin 0.3 - 1.2 mg/dL 0.3 0.6 0.6  Alkaline Phos 38 - 126 U/L 45 51 61  AST 15 - 41 U/L 14(L) 15 17  ALT 0 - 44 U/L 10 11 13    Sed rate 62  RADIOGRAPHIC STUDIES: I have personally reviewed the radiological images as listed and agreed with the findings in the report. Dg Chest 2 View  Result Date: 08/03/2018 CLINICAL DATA:  Fever with cough for 1 week. Nausea and vomiting. EXAM: CHEST - 2 VIEW COMPARISON:  None. FINDINGS: Normal heart size and pulmonary vascularity. Right paratracheal and left AP window  mediastinal masses likely representing prominent lymphadenopathy. Consider lymphoma. CT chest suggested for further characterization. Lungs are clear and expanded. No blunting of costophrenic angles. No pneumothorax. IMPRESSION: Right paratracheal and left AP window masses likely representing lymphadenopathy. Consider CT chest for further characterization. Electronically Signed   By: Lucienne Capers M.D.   On: 08/03/2018 23:21   Ct Chest  W Contrast  Result Date: 08/04/2018 CLINICAL DATA:  Mediastinal mass on chest radiography, for further characterization. EXAM: CT CHEST WITH CONTRAST TECHNIQUE: Multidetector CT imaging of the chest was performed during intravenous contrast administration. CONTRAST:  22mL OMNIPAQUE IOHEXOL 300 MG/ML  SOLN COMPARISON:  08/03/2018 FINDINGS: Cardiovascular: There is considerable extrinsic mass effect on the superior vena cava by the mediastinal mass, lowering the cross-sectional area to about 0.55 cm^2 on image 32/2, with some irregularity along the caval margin which could conceivably represent invasion of the cava or a small amount of marginal thrombus. No large acute pulmonary embolus although today's exam was not timed or performed to assess specifically for pulmonary embolus. There is narrowing of the left upper lobe pulmonary artery due to the mass. Mediastinum/Nodes: Confluent and irregular mediastinal mass also extending into the left hilum and questionably involving the periphery of the left lung/pleural margin. A representative measurement on image 48/2 measures 10.3 by 6.0 cm. The mass extends around much of the aortic arch and portions the branch vasculature and abuts the left pulmonary artery. Extension in the anterior mediastinum has mass effect on the SVC is noted above. Both internal mammary arteries course through this tumor. The tumor involves the AP window. Left infrahilar node 1.2 cm in short axis on image 61/2. Pathologic supraclavicular adenopathy including a  1.2 cm node on image 9/2. A lower neck level IV lymph node measures 1.4 cm in short axis on image 1/2. Right supraclavicular node 1.0 cm in short axis, image 9/2. Pathologic left axillary and subpectoral adenopathy, index axillary node 2.2 cm in short axis on image 18/2. An upper mediastinal node below the thyroid gland measures 1.9 cm in short axis on image 16/2. Lungs/Pleura: Tumor extends around the left upper lobe tracheobronchial tree centrally. A lung nodule in the left upper lobe medially measures 1.0 by 1.1 cm on image 38/4. Tumor tracks along the pleural space below the left first rib as shown on image 22/2, measuring about 1.7 cm anterior-posterior. Upper Abdomen: Nodular heterogeneous enhancement in the spleen favoring tumor involvement. An index lesion measures 2.5 by 1.9 cm on image 122/2, but multiple splenic nodules are present. There is likely a lymph node in the splenic hilum on image 133/2, the bottom most image. Porta hepatis adenopathy identified including a 1.3 cm peripancreatic node on image 132/2 and a portacaval node measuring 1.5 cm in short axis on image 133/2. A lymph node between the right hemidiaphragmatic crus and the IVC measures 1.2 cm in short axis also on image 132. Hypodense lesion medially in the right hepatic lobe on image 127/2 measures 1.6 by 1.3 cm. Questionable hypodense lesion peripherally in the right hepatic lobe about 0.9 cm in diameter on image 116/2. Musculoskeletal: Permeative destruction of the sternal manubrium, with tumor and/or hematoma along the anterior and posterior manubrial margins. The tumor rind anterior to the manubrium measures up to about 1.4 cm in thickness on image 97/6. IMPRESSION: 1. 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 In addition there are multiple nodular lesions in the spleen and possibly in the  liver, as well as permeative destruction of the sternal manubrium with tumor rind anterior and posterior to the sternum. 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 Left-sided small cell lung cancer might give a similar appearance. Aggressive germ-cell tumor or thymic carcinoma or less likely differential diagnostic considerations. Tissue diagnosis recommended. 2.  The anterior mediastinal mass is causing narrowing of the SVC, and mild invasion of the SVC with slight marginal thrombosis is not readily excluded. No large pulmonary embolus is identified. Electronically Signed   By: Van Clines M.D.   On: 08/04/2018 01:22   Ct Abdomen Pelvis W Contrast  Result Date: 08/05/2018 CLINICAL DATA:  Staging initial workup of Hodgkin's lymphoma EXAM: CT ABDOMEN AND PELVIS WITH CONTRAST TECHNIQUE: Multidetector CT imaging of the abdomen and pelvis was performed using the standard protocol following bolus administration of intravenous contrast. CONTRAST:  133mL OMNIPAQUE IOHEXOL 300 MG/ML SOLN, 48mL ISOVUE-300 IOPAMIDOL (ISOVUE-300) INJECTION 61% COMPARISON:  08/04/1018 FINDINGS: Lower chest: Small left pleural effusion. A lymph node in the right pericardial adipose tissue measures 0.7 cm in short axis on image 7/2 Hepatobiliary: Indistinct hypodense lesion in the lateral segment left hepatic lobe 0.9 by 0.7 cm on image 16/2. Indistinct hypodense lesion posteriorly in the right hepatic lobe, 1.0 by 0.9 cm on image 23/2. Indistinct hypodense lesion in the right hepatic lobe measuring 1.5 by 1.1 cm on image 18/2. Indistinct hypodense lesion in the right hepatic lobe measuring 1.21.2 cm on image 15/2. These hypodense lesions are not sharply defined and are not characteristic of cysts. Mildly contracted gallbladder. Pancreas: Unremarkable Spleen: Multiple hypodense solid appearing lesions are present in the spleen and suspicious for malignancy. An index lesion laterally in the spleen measures 1.3 by  1.6 cm on image 14/2. At least 15 lesions are present in the spleen. Adrenals/Urinary Tract: Unremarkable Stomach/Bowel: Unremarkable Vascular/Lymphatic: Porta hepatis, portacaval, retroperitoneal, right common iliac, and right external iliac adenopathy noted. Index porta hepatis/peripancreatic node 1.3 cm in short axis on image 21/2. Index aortocaval node 1.2 cm in short axis on image 37/2. Index right common iliac lymph node 1.6 cm in short axis, image 3/2. Reproductive: 7.6 by 4.9 by 4.4 cm primarily fatty mass of the left ovary with some calcification and fluid density elements is compatible with a dermoid cyst. Other: No supplemental non-categorized findings. Musculoskeletal: Unremarkable IMPRESSION: 1. Mild-to-moderate pathologic abdominal and pelvic adenopathy as detailed above. 2. Abnormal hypodense solid-appearing lesions in the liver and spleen, suspicious for malignant involvement. 3. 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 4. Small left pleural effusion. Electronically Signed   By: Van Clines M.D.   On: 08/05/2018 19:46   [place] Core Biopsy (lymph Nodes)  Result Date: 08/04/2018 INDICATION: No known primary, now with anterior mediastinal mass and left supraclavicular lymphadenopathy worrisome for lymphoma. Please perform ultrasound-guided supraclavicular lymph node biopsy for tissue diagnostic purposes. EXAM: ULTRASOUND-GUIDED LEFT SUPRACLAVICULAR LYMPH NODE BIOPSY COMPARISON:  Chest CT-earlier same day MEDICATIONS: None ANESTHESIA/SEDATION: Moderate (conscious) sedation was employed during this procedure. A total of Versed 2 mg and Fentanyl 100 mcg was administered intravenously. Moderate Sedation Time: 10 minutes. The patient's level of consciousness and vital signs were monitored continuously by radiology nursing throughout the procedure under my direct supervision. COMPLICATIONS: None immediate. TECHNIQUE: Informed written consent was obtained from the patient after a  discussion of the risks, benefits and alternatives to treatment. Questions regarding the procedure were encouraged and answered. Initial ultrasound scanning demonstrated multiple pathologically enlarged left cervical lymph nodes. Dominant nodal conglomeration within the left supraclavicular fossa measuring approximately 1.3 x 1.5 cm was targeted for biopsy given lymph node location and sonographic window (image 14). An ultrasound image was saved for documentation purposes. The procedure was planned. A timeout was performed prior to the initiation of the procedure. The operative was prepped  and draped in the usual sterile fashion, and a sterile drape was applied covering the operative field. A timeout was performed prior to the initiation of the procedure. Local anesthesia was provided with 1% lidocaine with epinephrine. Under direct ultrasound guidance, an 18 gauge core needle device was utilized to obtain to obtain 6 core needle biopsies of the dominant left supraclavicular nodal conglomeration. The samples were placed in saline and submitted to pathology. The needle was removed and hemostasis was achieved with manual compression. Post procedure scan was negative for significant hematoma. A dressing was placed. The patient tolerated the procedure well without immediate postprocedural complication. IMPRESSION: Technically successful ultrasound guided biopsy of dominant left supraclavicular nodal conglomeration. Electronically Signed   By: Sandi Mariscal M.D.   On: 08/04/2018 16:50    ASSESSMENT & PLAN:  37 y.o. female with   1. Anterior Mediastinal Mass - likely bulky lymphadenopathy with additional supraclavicular and left axillary LNadenopathy. LDH - no significantly elevated. 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 With cytopenias concern for BM involvement with Hodgkins lymphoma  2. Liver and  splenic lesions concerning for involvement with lymphoma/malignancy  3. SVC compression due to anterior mediastinal mass - no upper extremity , neck or face swelling or change in visiion to suggest overt SVC syndrome  4. Microcytic anemia - normal iron. Likely anemia or chronic disease . RecentLabs       Lab Results  Component Value Date   IRON 23 (L) 08/04/2018   TIBC 196 (L) 08/04/2018   IRONPCTSAT 12 08/04/2018     (Iron and TIBC)  RecentLabs       Lab Results  Component Value Date   FERRITIN 91 08/04/2018     5. B12 deficiency ? Pernicious anemia PLAN  -Discussed pt labwork today, 08/05/18; HGB at 7.7, blood counts are otherwise stable. Blood chemistries are stable. Sedimentation Rate elevated at 62 -Discussed giving pt a blood transfusion, which the pt prefers. Will order 2 units PRBCs.  -Discussed with the pt that pathology will likely be finalized tomorrow morning, but clinical concern is for Hodgkin's lymphoma   -08/05/18 ECHO revealed LV EF of 65-70%. No pericardia effusion -08/05/18 Pulmonary function testing ordered -Continue with Vitamin B12 and Vitamin B complex replacement  -CT A/P ordered and reviewed -Proceed with BM Biopsy tomorrow  -Discussed that BM bx will help to finalize the staging, but regardless, treatment will be with curative intent, though there is concern for Stage IV with suppressed blood counts  -Will coordinate PET/CT as outpatient -Discussed temporary picc line placement for the first two cycles, then possibly port placement after that, given current burden of disease in chest -Will look to expedite treatment considerations  -Discussed the 10% risk that chemotherapy can cause infertility, and the possible interventions available to the pt to mitigate this risk. The pt notes that she is not planning on having any more children. -Will continue to follow pt and will discuss final pathology report with pt tomorrow, and recommendation for  treatment.   -PRBC transfusion x 2 ordered. Informed consent obtained. -Dexamethasone high dose to start shrinking mediastinal mass to reduce risk of SVC syndrome.   The total time spent in the appt was 40 minutes and more than 50% was on counseling and direct patient cares.    Sullivan Lone MD [place] AAHIVMS [facility] Hematology/Oncology [facility]  (Office):  [phone] (Work cell):  [phone] (Fax):           [phone]  08/05/2018 5:22 PM   I, Baldwin [place], am acting as a scribe for Dr. Irene Limbo  .I have reviewed the above documentation for accuracy and completeness, and I agree with the above. Sullivan Lone MD MS

## 2018-08-06 ENCOUNTER — Ambulatory Visit: Payer: Self-pay

## 2018-08-06 ENCOUNTER — Telehealth: Payer: Self-pay | Admitting: Hematology

## 2018-08-06 ENCOUNTER — Other Ambulatory Visit: Payer: Self-pay | Admitting: Hematology

## 2018-08-06 ENCOUNTER — Inpatient Hospital Stay (HOSPITAL_COMMUNITY): Payer: Medicaid Other

## 2018-08-06 DIAGNOSIS — Z7189 Other specified counseling: Secondary | ICD-10-CM | POA: Insufficient documentation

## 2018-08-06 DIAGNOSIS — C8190 Hodgkin lymphoma, unspecified, unspecified site: Secondary | ICD-10-CM | POA: Insufficient documentation

## 2018-08-06 DIAGNOSIS — C8198 Hodgkin lymphoma, unspecified, lymph nodes of multiple sites: Secondary | ICD-10-CM

## 2018-08-06 DIAGNOSIS — C8178 Other classical Hodgkin lymphoma, lymph nodes of multiple sites: Secondary | ICD-10-CM

## 2018-08-06 LAB — CBC WITH DIFFERENTIAL/PLATELET
ABS IMMATURE GRANULOCYTES: 0.03 10*3/uL (ref 0.00–0.07)
BASOS ABS: 0 10*3/uL (ref 0.0–0.1)
BASOS PCT: 0 %
Eosinophils Absolute: 0.1 10*3/uL (ref 0.0–0.5)
Eosinophils Relative: 3 %
HCT: 36.2 % (ref 36.0–46.0)
Hemoglobin: 11.1 g/dL — ABNORMAL LOW (ref 12.0–15.0)
IMMATURE GRANULOCYTES: 1 %
Lymphocytes Relative: 6 %
Lymphs Abs: 0.3 10*3/uL — ABNORMAL LOW (ref 0.7–4.0)
MCH: 24.4 pg — ABNORMAL LOW (ref 26.0–34.0)
MCHC: 30.7 g/dL (ref 30.0–36.0)
MCV: 79.7 fL — ABNORMAL LOW (ref 80.0–100.0)
MONOS PCT: 4 %
Monocytes Absolute: 0.2 10*3/uL (ref 0.1–1.0)
Neutro Abs: 5 10*3/uL (ref 1.7–7.7)
Neutrophils Relative %: 86 %
Platelets: 459 10*3/uL — ABNORMAL HIGH (ref 150–400)
RBC: 4.54 MIL/uL (ref 3.87–5.11)
RDW: 17.6 % — AB (ref 11.5–15.5)
WBC: 5.7 10*3/uL (ref 4.0–10.5)
nRBC: 0 % (ref 0.0–0.2)

## 2018-08-06 LAB — COMPREHENSIVE METABOLIC PANEL
ALBUMIN: 2.8 g/dL — AB (ref 3.5–5.0)
ALT: 13 U/L (ref 0–44)
AST: 15 U/L (ref 15–41)
Alkaline Phosphatase: 56 U/L (ref 38–126)
Anion gap: 10 (ref 5–15)
BUN: 7 mg/dL (ref 6–20)
CHLORIDE: 106 mmol/L (ref 98–111)
CO2: 24 mmol/L (ref 22–32)
CREATININE: 0.56 mg/dL (ref 0.44–1.00)
Calcium: 8.4 mg/dL — ABNORMAL LOW (ref 8.9–10.3)
GFR calc Af Amer: >60 mL/min (ref >60)
GFR calc non Af Amer: >60 mL/min (ref >60)
GLUCOSE: 140 mg/dL — AB (ref 70–99)
POTASSIUM: 4.1 mmol/L (ref 3.5–5.1)
Sodium: 140 mmol/L (ref 135–145)
Total Bilirubin: 0.6 mg/dL (ref 0.3–1.2)
Total Protein: 7.1 g/dL (ref 6.5–8.1)

## 2018-08-06 LAB — PREPARE RBC (CROSSMATCH)

## 2018-08-06 LAB — HEPATITIS B CORE ANTIBODY, TOTAL: Hep B Core Total Ab: NEGATIVE

## 2018-08-06 LAB — MAGNESIUM: MAGNESIUM: 2.1 mg/dL (ref 1.7–2.4)

## 2018-08-06 LAB — PHOSPHORUS: Phosphorus: 3.1 mg/dL (ref 2.5–4.6)

## 2018-08-06 LAB — HEPATITIS C ANTIBODY: HCV Ab: 0.1 s/co ratio (ref 0.0–0.9)

## 2018-08-06 LAB — HEPATITIS B SURFACE ANTIGEN: Hepatitis B Surface Ag: NEGATIVE

## 2018-08-06 MED ORDER — B COMPLEX-C PO TABS: 1.0000 | ORAL_TABLET | Freq: Every day | ORAL | 0 refills | Status: DC

## 2018-08-06 MED ORDER — RIVAROXABAN 10 MG PO TABS
10.0000 mg | ORAL_TABLET | Freq: Every day | ORAL | Status: DC
  Administered 2018-08-06: 10 mg via ORAL
  Filled 2018-08-06: qty 1

## 2018-08-06 MED ORDER — FERROUS SULFATE 325 (65 FE) MG PO TABS: 325.0000 mg | ORAL_TABLET | Freq: Every day | ORAL | 0 refills | Status: AC

## 2018-08-06 MED ORDER — HEPARIN SODIUM (PORCINE) 5000 UNIT/ML IJ SOLN
5000.0000 [IU] | Freq: Three times a day (TID) | INTRAMUSCULAR | Status: DC
  Administered 2018-08-06: 5000 [IU] via SUBCUTANEOUS
  Filled 2018-08-06: qty 1

## 2018-08-06 MED ORDER — DEXAMETHASONE 4 MG PO TABS: 20.0000 mg | ORAL_TABLET | Freq: Every day | ORAL | 0 refills | Status: AC

## 2018-08-06 MED ORDER — DEXAMETHASONE 4 MG PO TABS: 20.0000 mg | ORAL_TABLET | Freq: Every day | ORAL | 0 refills | Status: DC

## 2018-08-06 MED ORDER — MIDAZOLAM HCL 2 MG/2ML IJ SOLN
INTRAMUSCULAR | Status: AC
  Filled 2018-08-06: qty 4

## 2018-08-06 MED ORDER — FENTANYL CITRATE (PF) 100 MCG/2ML IJ SOLN
INTRAMUSCULAR | Status: AC
  Filled 2018-08-06: qty 4

## 2018-08-06 MED ORDER — SODIUM CHLORIDE 0.9% FLUSH: 10.0000 mL | INTRAVENOUS | Status: DC | PRN

## 2018-08-06 MED ORDER — RIVAROXABAN 10 MG PO TABS: 10.0000 mg | ORAL_TABLET | Freq: Every day | ORAL | 0 refills | Status: DC

## 2018-08-06 MED ORDER — B-12 1000 MCG SL SUBL: 2000.0000 ug | SUBLINGUAL_TABLET | Freq: Every day | SUBLINGUAL | 0 refills | Status: DC

## 2018-08-06 MED ORDER — NALOXONE HCL 0.4 MG/ML IJ SOLN
INTRAMUSCULAR | Status: AC
  Filled 2018-08-06: qty 1

## 2018-08-06 MED ORDER — MIDAZOLAM HCL 2 MG/2ML IJ SOLN
INTRAMUSCULAR | Status: AC | PRN
  Administered 2018-08-06 (×2): 1 mg via INTRAVENOUS

## 2018-08-06 MED ORDER — FENTANYL CITRATE (PF) 100 MCG/2ML IJ SOLN
INTRAMUSCULAR | Status: AC | PRN
  Administered 2018-08-06 (×2): 50 ug via INTRAVENOUS

## 2018-08-06 MED ORDER — FLUMAZENIL 0.5 MG/5ML IV SOLN
INTRAVENOUS | Status: AC
  Filled 2018-08-06: qty 5

## 2018-08-06 MED ORDER — RIVAROXABAN 10 MG PO TABS
10.0000 mg | ORAL_TABLET | Freq: Every day | ORAL | Status: DC
  Filled 2018-08-06: qty 1

## 2018-08-06 NOTE — Progress Notes (Signed)
START ON PATHWAY REGIMEN - Lymphoma and CLL     A cycle is every 28 days:     Doxorubicin      Dacarbazine      Vinblastine      Bleomycin   **Always confirm dose/schedule in your pharmacy ordering system**  Patient Characteristics: Classical Hodgkin Lymphoma, First Line, Stage III / IV Disease Type: Not Applicable Disease Type: Not Applicable Disease Type: Classical Hodgkin Lymphoma Line of therapy: First Line [place] Stage: IVBE Intent of Therapy: Curative Intent, Discussed with Patient

## 2018-08-06 NOTE — Discharge Instructions (Signed)
Information on my medicine - XARELTO (Rivaroxaban)  This medication education was reviewed with me or my healthcare representative as part of my discharge preparation.  The pharmacist that spoke with me during my hospital stay was: Rhona Leavens. Why was Xarelto prescribed for you? Xarelto was prescribed for you to reduce the risk of a blood clot forming that can cause a stroke if you have a medical condition called atrial fibrillation (a type of irregular heartbeat).  What do you need to know about xarelto ? Take your Xarelto ONCE DAILY at the same time every day with your evening meal. If you have difficulty swallowing the tablet whole, you may crush it and mix in applesauce just prior to taking your dose.  Take Xarelto exactly as prescribed by your doctor and DO NOT stop taking Xarelto without talking to the doctor who prescribed the medication.  Stopping without other stroke prevention medication to take the place of Xarelto may increase your risk of developing a clot that causes a stroke.  Refill your prescription before you run out.  After discharge, you should have regular check-up appointments with your healthcare provider that is prescribing your Xarelto.  In the future your dose may need to be changed if your kidney function or weight changes by a significant amount.  What do you do if you miss a dose? If you are taking Xarelto ONCE DAILY and you miss a dose, take it as soon as you remember on the same day then continue your regularly scheduled once daily regimen the next day. Do not take two doses of Xarelto at the same time or on the same day.   Important Safety Information A possible side effect of Xarelto is bleeding. You should call your healthcare provider right away if you experience any of the following: ? Bleeding from an injury or your nose that does not stop. ? Unusual colored urine (red or dark brown) or unusual colored stools (red or black). ? Unusual bruising for  unknown reasons. ? A serious fall or if you hit your head (even if there is no bleeding).  Some medicines may interact with Xarelto and might increase your risk of bleeding while on Xarelto. To help avoid this, consult your healthcare provider or pharmacist prior to using any new prescription or non-prescription medications, including herbals, vitamins, non-steroidal anti-inflammatory drugs (NSAIDs) and supplements.  This website has more information on Xarelto: [email or web address]

## 2018-08-06 NOTE — Telephone Encounter (Signed)
Scheduled appt per 10/11 sch message - left message for patient with appt date and time- Rn for Dr. Irene Limbo made aware - unable to reach patient .

## 2018-08-06 NOTE — Discharge Summary (Signed)
Physician Discharge Summary  Cassandra Clark:[id number] DOB: 1981/08/25 DOA: 08/03/2018  PCP: Patient, No Pcp Per  Admit date: 08/03/2018 Discharge date: 08/06/2018  Admitted From: Home Disposition: Home  Recommendations for Outpatient Follow-up:  1. Follow up with PCP in 1-2 weeks 2. Follow up with Dr. Irene Limbo as an outpatient  3. Please obtain CMP/CBC, Mag, Phos in one week 4. Please follow up on the following pending results:  Home Health: No Equipment/Devices: None  Discharge Condition: Stable CODE STATUS: FULL CODE  Diet recommendation:   Brief/Interim Summary: The patient is a 37 year old African-American female with past medical history significant for anemia presented to the emergency department with complaints of a new onset fever and 2 weeks history of night sweats, dry cough, fatigue, decreased appetite and unintentional weight loss. Also has been complaining of having intermittent headaches and she was seen in the ER about a month ago for this.   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 In addition there is also multiple nodular lesions in the spleen and possibly the liver as well as permeative destruction of the sternal manubrium with tumor rind anterior and posterior to the sternum findings were concerning for a aggressive lymphoma versus a left-sided small cell lung cancer or a an aggressive germ cell tumor or thymic carcinoma which are less likely in the differential. This anterior mediastinal mass was causing narrowing of the SVC with mild elevation of the SVC slight marginal thrombosis not ready excluded and there is no PE identified.   I discussed the case with Dr. Cyndia Bent of Cardiothoracic surgery who recommended obtaining an IR consult for supraclavicular nodecore needle biopsy and was done  on 08/04/18. Dr. Channing Mutters was consulted for further evaluation and recommendations as well as radiation oncology. 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 She is to start Chemotherapy as an outpatient.  She is deemed medically stable to be discharged after I discussed the case with Dr. Lenon Curt and she will need to follow-up with Dr. Lenon Curt to start chemotherapy.  Dr. Lenon Curt has recommended placing a midline prior to discharge and this is currently being done.  Dr. Lenon Curt also recommended starting anticoagulation and pharmacy is educated the patient about prophylaxis with Xarelto.  She also need to follow-up and establish with a PCP and not next coming weeks.  Discharge Diagnoses:  Principal Problem:   Superior vena cava syndrome Active Problems:   Hypophosphatemia   Anemia   Leukopenia   Lymphoma (HCC)   Hyperglycemia   B12 deficiency   Mediastinal mass   Symptomatic anemia  Mediastinal Mass with Bulky mediastinal Disease and Axillary Suprclavicular LN's likely Hodgkin's Lymphoma Concern for SVC Syndrome -There is no facial plethora, chest pain, dyspnea, vascular or any neurological manifestations. -Admitted to telemetry/inpatient. -Supplemental oxygen as needed. -Cough suppressants and antiemetics as needed. -Checked E83, folic acid, iron studies, LDH, CEA, uric acid. -Check HIV, hep B, hep C and was Negative Check beta-2 microglobulin 7 protein electrophoresis. -Oncology consulted and appreciate further evaluation and recommendations -Oncology recommending Starting Xarelto 10 mg po Daily x 60 days for prophylaxis  -Patient had a biopsy of the supraclavicular lymph node yesterday and showed Classical Hodgkin Lymphoma -Medical Oncology is started patient on high-dose steroids pending urgent consideration chemotherapy -PFTs are also being done as well as an Echocardiogram -Patient will need a PET/CT to complete staging as an outpatient however Dr. Irene Limbo has  ordered CT-guided  bone marrow aspiration and biopsy for Hodgkin's lymphoma staging -CT of the abdomen pelvis with contrast is also been ordered for initial work-up; CT Abdomen and Pelvis w/ Contrast showed mild-to-moderate pathologic abdominal and pelvic adenopathy and abnormal hypodense solid-appearing lesions in the liver and spleen, suspicious for malignant involvement. There was Left Ovarian dermoid cyst measuring up to 7.6 cm in long axis and small left pleural effusion. -LDH slightly elevated at 203 -Radiation Oncology consulted for radiotherapy for related to the large masses and concern for SVC syndrome and patient was going to be started on 3 weeks of radiotherapy but medical oncology requests holding off on treatment and proceeding with high-dose steroids initially begin systematic therapy in the future as she has no overt sympotoms of SVC Syndrome -Radiation Oncology is available if her clinical picture acutely worsens and if radiation is necessary -Appreciate Oncology evaluation further recommendations and recommending starting Chemotherapy as an outapatient and recommending placing Midline, Continuing Dexamethasone 20 mg x4 Days total, Starting Xarelto 10 mg po Daily, and Continuing Vitamin B12 2,000 mcg SL.  -Patient to start chemotherapy Monday with Doxorubicin, Dacrabazine, Vinblastine, Bleomycin  Hypophosphatemia -Improved -Patient's phosphorus level went from 2.0 and is now 3.1 -Continue monitor and replete as necessary -Repeat phosphorus level as an outpatient   Microcytic Anemia -In the setting of suspected lymphoma and dilutional drop sign -Patient's hemoglobin/hematocrit went from 8.6/29.3 on admission is now 7.7/26.6 -Anemia panel checked and showed an iron level 23, U IBC of 173, TIBC of 196, saturation ratios of 12%, ferritin level of 91, folate level 11.5, vitamin B12 level 135 -Vitabm B 12 being supplemented and patient being given cyanocobalamin thousand micrograms IM every 24 hours for  7 days and started on a B complex vitamin C 1 tablet p.o. Daily; Will D/C 2,000 mcg B12 sL -Check FOBT which is still currently pending to be done -Continue need to monitor for signs and symptoms of bleeding -Transfuse for symptomatic anemia and hemoglobin less than 7 but patient was transfused 2 units of pRBC's by Oncology -Repeat CBC after blood was 11.1/36.2 -Repeat CBC as an outpatient  Leukopenia, improved -Likely secondary to likely Hodgkin's Lymphoma. -WBC trended down and went from 3.6 on admission to 2.9 and now improved to 5.7 in the setting of Steroid Demargination -Continue to Monitor and Trend and repeat CBC as an outpatient  -Oncology following and appreciate further evaluation and recommendations and started Dexamethasone 20 mg po Daily x 4 days  -Patient underwent CT Guided iliac Crest Bone marrow aspiration and Biopsy of Right iliac Bone -Follow up Biopsy Results   Hyperglycemia -Patient's blood sugar was 107 on admission and blood sugars have been ranging from 95-107 on CMP's -Check hemoglobin A1c as an outpatient  -If blood sugars remain consistently elevated will place on sensitive NovoLog sliding scale AC and expect them to go up in the setting of steroids -Follow up with PCP as an outpatient   Thrombocytosis -Patient's platelet count on admission was 451 is now 459 -Continue monitor and repeat CBC as an outpatient  Discharge Instructions  Discharge Instructions    Call MD for:  difficulty breathing, headache or visual disturbances   Complete by:  As directed    Call MD for:  extreme fatigue   Complete by:  As directed    Call MD for:  hives   Complete by:  As directed    Call MD for:  persistant dizziness or light-headedness   Complete by:  As directed  Call MD for:  persistant nausea and vomiting   Complete by:  As directed    Call MD for:  redness, tenderness, or signs of infection (pain, swelling, redness, odor or green/yellow discharge around  incision site)   Complete by:  As directed    Call MD for:  severe uncontrolled pain   Complete by:  As directed    Call MD for:  temperature >100.4   Complete by:  As directed    Care order/instruction   Complete by:  As directed    Transfuse Parameters   Complete patient signature process for consent form   Complete by:  As directed    Diet general   Complete by:  As directed    Discharge instructions   Complete by:  As directed    You were cared for by a hospitalist during your hospital stay. If you have any questions about your discharge medications or the care you received while you were in the hospital after you are discharged, you can call the unit and ask to speak with the hospitalist on call if the hospitalist that took care of you is not available. Once you are discharged, your primary care physician will handle any further medical issues. Please note that NO REFILLS for any discharge medications will be authorized once you are discharged, as it is imperative that you return to your primary care physician (or establish a relationship with a primary care physician if you do not have one) for your aftercare needs so that they can reassess your need for medications and monitor your lab values.  Follow up and establish with PCP within 1 week. Follow up with Medical Oncology Dr. Irene Limbo as an outpatient to start chemotherapy. Take all medications as prescribed. If symptoms change or worsen please return to the ED for evaluation   Increase activity slowly   Complete by:  As directed    Practitioner attestation of consent   Complete by:  As directed    I, the ordering practitioner, attest that I have discussed with the patient the benefits, risks, side effects, alternatives, likelihood of achieving goals and potential problems during recovery for the procedure listed.   Procedure:  Blood Product(s)     Allergies as of 08/06/2018      Reactions   Penicillins Hives   Has patient had a PCN  reaction causing immediate rash, facial/tongue/throat swelling, SOB or lightheadedness with hypotension: Unknown Has patient had a PCN reaction causing severe rash involving mucus membranes or skin necrosis: Unknown Has patient had a PCN reaction that required hospitalization: Unknown Has patient had a PCN reaction occurring within the last 10 years: Unknown If all of the above answers are "NO", then may proceed with Cephalosporin use.      Medication List    STOP taking these medications   ibuprofen 200 MG tablet Commonly known as:  ADVIL,MOTRIN   naproxen sodium 220 MG tablet Commonly known as:  ALEVE     TAKE these medications   B-12 1000 MCG Subl Place 2,000 mcg under the tongue daily.   B-complex with vitamin C tablet Take 1 tablet by mouth daily. Start taking on:  08/07/2018   dexamethasone 4 MG tablet Commonly known as:  DECADRON Take 5 tablets (20 mg total) by mouth daily before breakfast for 2 days. Start taking on:  08/07/2018   ferrous sulfate 325 (65 FE) MG tablet Take 1 tablet (325 mg total) by mouth daily.   rivaroxaban 10 MG Tabs  tablet Commonly known as:  XARELTO Take 1 tablet (10 mg total) by mouth daily.       Allergies  Allergen Reactions  . Penicillins Hives    Has patient had a PCN reaction causing immediate rash, facial/tongue/throat swelling, SOB or lightheadedness with hypotension: Unknown Has patient had a PCN reaction causing severe rash involving mucus membranes or skin necrosis: Unknown Has patient had a PCN reaction that required hospitalization: Unknown Has patient had a PCN reaction occurring within the last 10 years: Unknown If all of the above answers are "NO", then may proceed with Cephalosporin use.    Consultations:  IR  Medical Oncology  Radiation Oncology  Discussed the Case with [facility] Dr. Cyndia Bent  Procedures/Studies: Dg Chest 2 View  Result Date: 08/03/2018 CLINICAL DATA:  Fever with cough for 1 week. Nausea and  vomiting. EXAM: CHEST - 2 VIEW COMPARISON:  None. FINDINGS: Normal heart size and pulmonary vascularity. Right paratracheal and left AP window mediastinal masses likely representing prominent lymphadenopathy. Consider lymphoma. CT chest suggested for further characterization. Lungs are clear and expanded. No blunting of costophrenic angles. No pneumothorax. IMPRESSION: Right paratracheal and left AP window masses likely representing lymphadenopathy. Consider CT chest for further characterization. Electronically Signed   By: Lucienne Capers M.D.   On: 08/03/2018 23:21   Ct Chest W Contrast  Result Date: 08/04/2018 CLINICAL DATA:  Mediastinal mass on chest radiography, for further characterization. EXAM: CT CHEST WITH CONTRAST TECHNIQUE: Multidetector CT imaging of the chest was performed during intravenous contrast administration. CONTRAST:  16m OMNIPAQUE IOHEXOL 300 MG/ML  SOLN COMPARISON:  08/03/2018 FINDINGS: Cardiovascular: There is considerable extrinsic mass effect on the superior vena cava by the mediastinal mass, lowering the cross-sectional area to about 0.55 cm^2 on image 32/2, with some irregularity along the caval margin which could conceivably represent invasion of the cava or a small amount of marginal thrombus. No large acute pulmonary embolus although today's exam was not timed or performed to assess specifically for pulmonary embolus. There is narrowing of the left upper lobe pulmonary artery due to the mass. Mediastinum/Nodes: Confluent and irregular mediastinal mass also extending into the left hilum and questionably involving the periphery of the left lung/pleural margin. A representative measurement on image 48/2 measures 10.3 by 6.0 cm. The mass extends around much of the aortic arch and portions the branch vasculature and abuts the left pulmonary artery. Extension in the anterior mediastinum has mass effect on the SVC is noted above. Both internal mammary arteries course through this  tumor. The tumor involves the AP window. Left infrahilar node 1.2 cm in short axis on image 61/2. Pathologic supraclavicular adenopathy including a 1.2 cm node on image 9/2. A lower neck level IV lymph node measures 1.4 cm in short axis on image 1/2. Right supraclavicular node 1.0 cm in short axis, image 9/2. Pathologic left axillary and subpectoral adenopathy, index axillary node 2.2 cm in short axis on image 18/2. An upper mediastinal node below the thyroid gland measures 1.9 cm in short axis on image 16/2. Lungs/Pleura: Tumor extends around the left upper lobe tracheobronchial tree centrally. A lung nodule in the left upper lobe medially measures 1.0 by 1.1 cm on image 38/4. Tumor tracks along the pleural space below the left first rib as shown on image 22/2, measuring about 1.7 cm anterior-posterior. Upper Abdomen: Nodular heterogeneous enhancement in the spleen favoring tumor involvement. An index lesion measures 2.5 by 1.9 cm on image 122/2, but multiple splenic nodules are present. There is  likely a lymph node in the splenic hilum on image 133/2, the bottom most image. Porta hepatis adenopathy identified including a 1.3 cm peripancreatic node on image 132/2 and a portacaval node measuring 1.5 cm in short axis on image 133/2. A lymph node between the right hemidiaphragmatic crus and the IVC measures 1.2 cm in short axis also on image 132. Hypodense lesion medially in the right hepatic lobe on image 127/2 measures 1.6 by 1.3 cm. Questionable hypodense lesion peripherally in the right hepatic lobe about 0.9 cm in diameter on image 116/2. Musculoskeletal: Permeative destruction of the sternal manubrium, with tumor and/or hematoma along the anterior and posterior manubrial margins. The tumor rind anterior to the manubrium measures up to about 1.4 cm in thickness on image 97/6. IMPRESSION: 1. Large primarily anterior mediastinal mass extending to the left hilum, with notable mediastinal, left axillary and  subpectoral, supraclavicular, and porta hepatis/upper abdominal adenopathy. In addition there are multiple nodular lesions in the spleen and possibly in the liver, as well as permeative destruction of the sternal manubrium with tumor rind anterior and posterior to the sternum. 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 Left-sided small cell lung cancer might give a similar appearance. Aggressive germ-cell tumor or thymic carcinoma or less likely differential diagnostic considerations. Tissue diagnosis recommended. 2. The anterior mediastinal mass is causing narrowing of the SVC, and mild invasion of the SVC with slight marginal thrombosis is not readily excluded. No large pulmonary embolus is identified. Electronically Signed   By: Van Clines M.D.   On: 08/04/2018 01:22   Ct Abdomen Pelvis W Contrast  Result Date: 08/05/2018 CLINICAL DATA:  Staging initial workup of Hodgkin's lymphoma EXAM: CT ABDOMEN AND PELVIS WITH CONTRAST TECHNIQUE: Multidetector CT imaging of the abdomen and pelvis was performed using the standard protocol following bolus administration of intravenous contrast. CONTRAST:  126m OMNIPAQUE IOHEXOL 300 MG/ML SOLN, 342mISOVUE-300 IOPAMIDOL (ISOVUE-300) INJECTION 61% COMPARISON:  08/04/1018 FINDINGS: Lower chest: Small left pleural effusion. A lymph node in the right pericardial adipose tissue measures 0.7 cm in short axis on image 7/2 Hepatobiliary: Indistinct hypodense lesion in the lateral segment left hepatic lobe 0.9 by 0.7 cm on image 16/2. Indistinct hypodense lesion posteriorly in the right hepatic lobe, 1.0 by 0.9 cm on image 23/2. Indistinct hypodense lesion in the right hepatic lobe measuring 1.5 by 1.1 cm on image 18/2. Indistinct hypodense lesion in the right hepatic lobe measuring 1.21.2 cm on image 15/2. These hypodense lesions are not sharply defined and are not characteristic of cysts. Mildly contracted gallbladder. Pancreas: Unremarkable Spleen:  Multiple hypodense solid appearing lesions are present in the spleen and suspicious for malignancy. An index lesion laterally in the spleen measures 1.3 by 1.6 cm on image 14/2. At least 15 lesions are present in the spleen. Adrenals/Urinary Tract: Unremarkable Stomach/Bowel: Unremarkable Vascular/Lymphatic: Porta hepatis, portacaval, retroperitoneal, right common iliac, and right external iliac adenopathy noted. Index porta hepatis/peripancreatic node 1.3 cm in short axis on image 21/2. Index aortocaval node 1.2 cm in short axis on image 37/2. Index right common iliac lymph node 1.6 cm in short axis, image 3/2. Reproductive: 7.6 by 4.9 by 4.4 cm primarily fatty mass of the left ovary with some calcification and fluid density elements is compatible with a dermoid cyst. Other: No supplemental non-categorized findings. Musculoskeletal: Unremarkable IMPRESSION: 1. Mild-to-moderate pathologic abdominal and pelvic adenopathy as detailed above. 2. Abnormal hypodense solid-appearing lesions in the liver and spleen, suspicious for malignant involvement. 3. Left ovarian  dermoid cyst measuring up to 7.6 cm in long axis. 4. Small left pleural effusion. Electronically Signed   By: Van Clines M.D.   On: 08/05/2018 19:46   Ct Biopsy  Result Date: 08/06/2018 INDICATION: 36 year old female with new diagnosis of Hodgkin's lymphoma. She presents for CT-guided bone marrow biopsy to complete staging. EXAM: CT GUIDED BONE MARROW ASPIRATION AND CORE BIOPSY Interventional Radiologist:  Criselda Peaches, MD MEDICATIONS: None. ANESTHESIA/SEDATION: Moderate (conscious) sedation was employed during this procedure. A total of 2 milligrams versed and 100 micrograms fentanyl were administered intravenously. The patient's level of consciousness and vital signs were monitored continuously by radiology nursing throughout the procedure under my direct supervision. Total monitored sedation time: 11 minutes FLUOROSCOPY TIME:  None  COMPLICATIONS: None immediate. Estimated blood loss: <25 mL PROCEDURE: Informed written consent was obtained from the patient after a thorough discussion of the procedural risks, benefits and alternatives. All questions were addressed. Maximal Sterile Barrier Technique was utilized including caps, mask, sterile gowns, sterile gloves, sterile drape, hand hygiene and skin antiseptic. A timeout was performed prior to the initiation of the procedure. The patient was positioned prone and non-contrast localization CT was performed of the pelvis to demonstrate the iliac marrow spaces. Maximal barrier sterile technique utilized including caps, mask, sterile gowns, sterile gloves, large sterile drape, hand hygiene, and betadine prep. Under sterile conditions and local anesthesia, an 11 gauge coaxial bone biopsy needle was advanced into the right iliac marrow space. Needle position was confirmed with CT imaging. Initially, bone marrow aspiration was performed. Next, the 11 gauge outer cannula was utilized to obtain a right iliac bone marrow core biopsy. Needle was removed. Hemostasis was obtained with compression. The patient tolerated the procedure well. Samples were prepared with the cytotechnologist. IMPRESSION: Technically successful CT-guided bone marrow biopsy and aspiration. Electronically Signed   By: Jacqulynn Cadet M.D.   On: 08/06/2018 12:56   Ct Bone Marrow Biopsy & Aspiration  Result Date: 08/06/2018 INDICATION: 37 year old female with new diagnosis of Hodgkin's lymphoma. She presents for CT-guided bone marrow biopsy to complete staging. EXAM: CT GUIDED BONE MARROW ASPIRATION AND CORE BIOPSY Interventional Radiologist:  Criselda Peaches, MD MEDICATIONS: None. ANESTHESIA/SEDATION: Moderate (conscious) sedation was employed during this procedure. A total of 2 milligrams versed and 100 micrograms fentanyl were administered intravenously. The patient's level of consciousness and vital signs were monitored  continuously by radiology nursing throughout the procedure under my direct supervision. Total monitored sedation time: 11 minutes FLUOROSCOPY TIME:  None COMPLICATIONS: None immediate. Estimated blood loss: <25 mL PROCEDURE: Informed written consent was obtained from the patient after a thorough discussion of the procedural risks, benefits and alternatives. All questions were addressed. Maximal Sterile Barrier Technique was utilized including caps, mask, sterile gowns, sterile gloves, sterile drape, hand hygiene and skin antiseptic. A timeout was performed prior to the initiation of the procedure. The patient was positioned prone and non-contrast localization CT was performed of the pelvis to demonstrate the iliac marrow spaces. Maximal barrier sterile technique utilized including caps, mask, sterile gowns, sterile gloves, large sterile drape, hand hygiene, and betadine prep. Under sterile conditions and local anesthesia, an 11 gauge coaxial bone biopsy needle was advanced into the right iliac marrow space. Needle position was confirmed with CT imaging. Initially, bone marrow aspiration was performed. Next, the 11 gauge outer cannula was utilized to obtain a right iliac bone marrow core biopsy. Needle was removed. Hemostasis was obtained with compression. The patient tolerated the procedure well. Samples were prepared with  the cytotechnologist. IMPRESSION: Technically successful CT-guided bone marrow biopsy and aspiration. Electronically Signed   By: Jacqulynn Cadet M.D.   On: 08/06/2018 12:56   [place] Core Biopsy (lymph Nodes)  Result Date: 08/04/2018 INDICATION: No known primary, now with anterior mediastinal mass and left supraclavicular lymphadenopathy worrisome for lymphoma. Please perform ultrasound-guided supraclavicular lymph node biopsy for tissue diagnostic purposes. EXAM: ULTRASOUND-GUIDED LEFT SUPRACLAVICULAR LYMPH NODE BIOPSY COMPARISON:  Chest CT-earlier same day MEDICATIONS: None  ANESTHESIA/SEDATION: Moderate (conscious) sedation was employed during this procedure. A total of Versed 2 mg and Fentanyl 100 mcg was administered intravenously. Moderate Sedation Time: 10 minutes. The patient's level of consciousness and vital signs were monitored continuously by radiology nursing throughout the procedure under my direct supervision. COMPLICATIONS: None immediate. TECHNIQUE: Informed written consent was obtained from the patient after a discussion of the risks, benefits and alternatives to treatment. Questions regarding the procedure were encouraged and answered. Initial ultrasound scanning demonstrated multiple pathologically enlarged left cervical lymph nodes. Dominant nodal conglomeration within the left supraclavicular fossa measuring approximately 1.3 x 1.5 cm was targeted for biopsy given lymph node location and sonographic window (image 14). An ultrasound image was saved for documentation purposes. The procedure was planned. A timeout was performed prior to the initiation of the procedure. The operative was prepped and draped in the usual sterile fashion, and a sterile drape was applied covering the operative field. A timeout was performed prior to the initiation of the procedure. Local anesthesia was provided with 1% lidocaine with epinephrine. Under direct ultrasound guidance, an 18 gauge core needle device was utilized to obtain to obtain 6 core needle biopsies of the dominant left supraclavicular nodal conglomeration. The samples were placed in saline and submitted to pathology. The needle was removed and hemostasis was achieved with manual compression. Post procedure scan was negative for significant hematoma. A dressing was placed. The patient tolerated the procedure well without immediate postprocedural complication. IMPRESSION: Technically successful ultrasound guided biopsy of dominant left supraclavicular nodal conglomeration. Electronically Signed   By: Sandi Mariscal M.D.   On:  08/04/2018 16:50   ECHOCARDIOGRAM ------------------------------------------------------------------- Study Conclusions  - Left ventricle: The cavity size was normal. There was mild focal   basal hypertrophy of the septum. Systolic function was vigorous.   The estimated ejection fraction was in the range of 65% to 70%.   Wall motion was normal; there were no regional wall motion   abnormalities. Left ventricular diastolic function parameters   were normal. - Pericardium, extracardiac: There was no significant pericardial   effusion.  Subjective: Seen and examined at bedside was doing well.  Denied any chest pain, lightheadedness or dizziness.  No nausea, no vomiting.  No other concerns or complaints at this time.  Patient is deemed medically stable to go home and will start chemotherapy as an outpatient.  Discharge Exam: Vitals:   08/06/18 1225 08/06/18 1414  BP: 110/60 128/72  Pulse: 99 91  Resp: 17 20  Temp: 97.6 F (36.4 C)   SpO2: 100% 98%   Vitals:   08/06/18 1124 08/06/18 1151 08/06/18 1225 08/06/18 1414  BP: 113/68 124/82 110/60 128/72  Pulse: 94 93 99 91  Resp: _0 Temp: (!) 97.5 F (36.4 C)  97.6 F (36.4 C)   TempSrc:      SpO2: 99% 100% 100% 98%  Weight:      Height:       General: Pt is alert, awake, not in acute distress Cardiovascular: RRR, S1/S2 +,  no rubs, no gallops Respiratory: Diminished bilaterally, no wheezing, no rhonchi Abdominal: Soft, NT, ND, bowel sounds + Extremities: no edema, no cyanosis  The results of significant diagnostics from this hospitalization (including imaging, microbiology, ancillary and laboratory) are listed below for reference.    Microbiology: No results found for this or any previous visit (from the past 240 hour(s)).   Labs: BNP (last 3 results) No results for input(s): BNP in the last 8760 hours. Basic Metabolic Panel: Recent Labs  Lab 08/03/18 2323 08/04/18 0551 08/05/18 0431 08/06/18 0952  NA 136  140 143 140  K 3.7 3.5 4.2 4.1  CL 103 106 111 106  CO2 _0 GLUCOSE 107* 95 103* 140*  BUN _1 CREATININE 0.81 0.69 0.64 0.56  CALCIUM 8.4* 8.0* 7.9* 8.4*  MG 1.9 2.0 2.0 2.1  PHOS 2.0* 2.7 2.8 3.1   Liver Function Tests: Recent Labs  Lab 08/03/18 2323 08/04/18 0551 08/05/18 0431 08/06/18 0952  AST 17 15 14* 15  ALT _2 ALKPHOS 61 51 45 56  BILITOT 0.6 0.6 0.3 0.6  PROT 7.5 6.8 6.2* 7.1  ALBUMIN 3.2* 2.7* 2.5* 2.8*   No results for input(s): LIPASE, AMYLASE in the last 168 hours. No results for input(s): AMMONIA in the last 168 hours. CBC: Recent Labs  Lab 08/03/18 2323 08/04/18 0402 08/05/18 0431 08/06/18 0952  WBC 3.6* 3.0* 2.9* 5.7  NEUTROABS 2.5 1.9 1.9 5.0  HGB 8.6* 8.3* 7.7* 11.1*  HCT 29.3* 28.7* 26.6* 36.2  MCV 77.7* 78.8* 79.4* 79.7*  PLT 451* 459* 417* 459*   Cardiac Enzymes: No results for input(s): CKTOTAL, CKMB, CKMBINDEX, TROPONINI in the last 168 hours. BNP: Invalid input(s): POCBNP CBG: No results for input(s): GLUCAP in the last 168 hours. D-Dimer No results for input(s): DDIMER in the last 72 hours. Hgb A1c No results for input(s): HGBA1C in the last 72 hours. Lipid Profile No results for input(s): CHOL, HDL, LDLCALC, TRIG, CHOLHDL, LDLDIRECT in the last 72 hours. Thyroid function studies No results for input(s): TSH, T4TOTAL, T3FREE, THYROIDAB in the last 72 hours.  Invalid input(s): FREET3 Anemia work up Recent Labs    08/04/18 0551  VITAMINB12 135*  FOLATE 11.5  FERRITIN 91  TIBC 196*  IRON 23*  RETICCTPCT 2.0   Urinalysis    Component Value Date/Time   COLORURINE YELLOW 08/03/2018 2256   APPEARANCEUR HAZY (A) 08/03/2018 2256   LABSPEC 1.020 08/03/2018 2256   PHURINE 7.0 08/03/2018 2256   GLUCOSEU NEGATIVE 08/03/2018 2256   HGBUR SMALL (A) 08/03/2018 2256   BILIRUBINUR NEGATIVE 08/03/2018 2256   KETONESUR NEGATIVE 08/03/2018 2256   PROTEINUR NEGATIVE 08/03/2018 2256   NITRITE NEGATIVE  08/03/2018 2256   LEUKOCYTESUR TRACE (A) 08/03/2018 2256   Sepsis Labs Invalid input(s): PROCALCITONIN,  WBC,  LACTICIDVEN Microbiology No results found for this or any previous visit (from the past 240 hour(s)).  Time coordinating discharge: 35 minutes  SIGNED:  Kerney Elbe, DO [facility] 08/06/2018, 6:32 PM Pager is on [place]  If 7PM-7AM, please contact night-coverage [email or web address] Password [email or web address]

## 2018-08-06 NOTE — Progress Notes (Signed)
MEDICATION-RELATED CONSULT NOTE   IR Procedure Consult - Anticoagulant/Antiplatelet PTA/Inpatient Med List Review by Pharmacist    Procedure: CT guided aspirate and core biopsy of right iliac bone    Completed: 10/11 11 am  Post-Procedural bleeding risk per IR MD assessment:Low    Antithrombotic medications on inpatient or PTA profile prior to procedure:   Sq heparin    Recommended restart time per IR Post-Procedure Guidelines:  4 hours   Other considerations:      Plan:    Resume heparin 5000 units sq q8h at 1500 today  Eudelia Bunch, Pharm.D [phone] 08/06/2018 11:13 AM

## 2018-08-06 NOTE — Progress Notes (Signed)
Patient given discharge, follow up, and medication instructions, verbalized understanding, PIV and telemetry monitor removed, to DC with midline IV catheter, personal belongings and prescriptions with patient, family to transport home

## 2018-08-06 NOTE — Progress Notes (Signed)
HEMATOLOGY/ONCOLOGY INPATIENT PROGRESS NOTE  Date of Service: 08/06/2018  Inpatient Attending: .Sheikh, Omair [place], DO   SUBJECTIVE:   Cassandra Clark is accompanied today by her sister and father. The pt reports that she is doing well overall.   The pt reports that she has not developed any new concerns overnight. She has not had difficulty sleeping while on steroids. She did not have any problems with her blood transfusion.   She notes that she has had a little congestion in her chest. The pt denies any problems swallowing or breathing.   The pt also notices that her left neck swelling and left arm swelling has reduced with the steroids.  Rediscussed fertility preservation strategies - patient declines lupron or ovum preservation. 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 Gave patient education material to read about CHL. Discussed treatment options.  Lab results today (08/06/18) of CBC w/diff, CMP is as follows: all values are WNL except for HGB at 11.1, MCV at 79.7, MCH at 24.4, RDW at 17.6, PLT at 459k, Lymphs abs at 300, Glucose at 140, Calcium at 8.4, Albumin at 2.8.  On review of systems, pt reports some congestion, stable energy levels, and denies problems swallowing, abdominal pains, leg swelling, and any other symptoms.    OBJECTIVE:  NAD PHYSICAL EXAMINATION: . Vitals:   08/06/18 1124 08/06/18 1151 08/06/18 1225 08/06/18 1414  BP: 113/68 124/82 110/60 128/72  Pulse: 94 93 99 91  Resp: _0 Temp: (!) 97.5 F (36.4 C)  97.6 F (36.4 C)   TempSrc:      SpO2: 99% 100% 100% 98%  Weight:      Height:       Filed Weights   08/03/18 2143 08/04/18 0457  Weight: 160 lb (72.6 kg) 158 lb 4.6 oz (71.8 kg)   .Body mass index is 24.79 kg/m.  GENERAL:alert, in no acute distress and comfortable SKIN: no acute rashes, no significant lesions EYES: conjunctiva are pink and non-injected, sclera anicteric OROPHARYNX: MMM, no exudates, no  oropharyngeal erythema or ulceration NECK: supple, no JVD LYMPH:  B/l supraclavicular and left axillary lymphadenopathy decreased in size. LUNGS: clear to auscultation b/l with normal respiratory effort HEART: regular rate & rhythm ABDOMEN:  normoactive bowel sounds , non tender, not distended. No palpable hepatosplenomegaly.  Extremity: trace pedal edema PSYCH: alert & oriented x 3 with fluent speech NEURO: no focal motor/sensory deficits   MEDICAL HISTORY:  Past Medical History:  Diagnosis Date  . Anemia     SURGICAL HISTORY: History reviewed. No pertinent surgical history.  SOCIAL HISTORY: Social History   Socioeconomic History  . Marital status: Legally Separated    Spouse name: Not on file  . Number of children: Not on file  . Years of education: Not on file  . Highest education level: Not on file  Occupational History  . Not on file  Social Needs  . Financial resource strain: Not on file  . Food insecurity:    Worry: Not on file    Inability: Not on file  . Transportation needs:    Medical: Not on file    Non-medical: Not on file  Tobacco Use  . Smoking status: Never Smoker  . Smokeless tobacco: Never Used  Substance and Sexual Activity  . Alcohol use: No  . Drug use: No    Comment: Drinks tea  . Sexual activity: Not on file  Lifestyle  . Physical activity:    Days per week: Not  on file    Minutes per session: Not on file  . Stress: Not on file  Relationships  . Social connections:    Talks on phone: Not on file    Gets together: Not on file    Attends religious service: Not on file    Active member of club or organization: Not on file    Attends meetings of clubs or organizations: Not on file    Relationship status: Not on file  . Intimate partner violence:    Fear of current or ex partner: Not on file    Emotionally abused: Not on file    Physically abused: Not on file    Forced sexual activity: Not on file  Other Topics Concern  . Not on file    Social History Narrative  . Not on file    FAMILY HISTORY: Family History  Problem Relation Age of Onset  . Diabetes Mellitus II Father   . Hypertension Father   . Congestive Heart Failure Maternal Grandmother   . Diabetes Mellitus II Maternal Grandmother   . Diabetes Mellitus II Maternal Uncle   . Congestive Heart Failure Maternal Uncle     ALLERGIES:  is allergic to penicillins.  MEDICATIONS:  Scheduled Meds: . B-complex with vitamin C  1 tablet Oral Daily  . cyanocobalamin  1,000 mcg Intramuscular Q24H  . dexamethasone  20 mg Oral QAC breakfast  . fentaNYL      . heparin  5,000 Units Subcutaneous Q8H  . midazolam       Continuous Infusions: . sodium chloride 100 mL/hr at 08/06/18 0939   PRN Meds:.acetaminophen **OR** acetaminophen, heparin lock flush, heparin lock flush, iopamidol, morphine injection, promethazine, sodium chloride flush, sodium chloride flush, traZODone  REVIEW OF SYSTEMS:    A 10+ POINT REVIEW OF SYSTEMS WAS OBTAINED including neurology, dermatology, psychiatry, cardiac, respiratory, lymph, extremities, GI, GU, Musculoskeletal, constitutional, breasts, reproductive, HEENT.  All pertinent positives are noted in the HPI.  All others are negative.   LABORATORY DATA:  I have reviewed the data as listed  . CBC Latest Ref Rng & Units 08/06/2018 08/05/2018 08/04/2018  WBC 4.0 - 10.5 K/uL 5.7 2.9(L) 3.0(L)  Hemoglobin 12.0 - 15.0 g/dL 11.1(L) 7.7(L) 8.3(L)  Hematocrit 36.0 - 46.0 % 36.2 26.6(L) 28.7(L)  Platelets 150 - 400 K/uL 459(H) 417(H) 459(H)    . CMP Latest Ref Rng & Units 08/06/2018 08/05/2018 08/04/2018  Glucose 70 - 99 mg/dL 140(H) 103(H) 95  BUN 6 - 20 mg/dL _0 Creatinine 0.44 - 1.00 mg/dL 0.56 0.64 0.69  Sodium 135 - 145 mmol/L 140 143 140  Potassium 3.5 - 5.1 mmol/L 4.1 4.2 3.5  Chloride 98 - 111 mmol/L 106 111 106  CO2 22 - 32 mmol/L _1 Calcium 8.9 - 10.3 mg/dL 8.4(L) 7.9(L) 8.0(L)  Total Protein 6.5 - 8.1 g/dL 7.1 6.2(L)  6.8  Total Bilirubin 0.3 - 1.2 mg/dL 0.6 0.3 0.6  Alkaline Phos 38 - 126 U/L 56 45 51  AST 15 - 41 U/L 15 14(L) 15  ALT 0 - 44 U/L _2 Sed rate 62  RADIOGRAPHIC STUDIES: I have personally reviewed the radiological images as listed and agreed with the findings in the report. Dg Chest 2 View  Result Date: 08/03/2018 CLINICAL DATA:  Fever with cough for 1 week. Nausea and vomiting. EXAM: CHEST - 2 VIEW COMPARISON:  None. FINDINGS: Normal heart size and pulmonary vascularity. Right paratracheal and left AP  window mediastinal masses likely representing prominent lymphadenopathy. Consider lymphoma. CT chest suggested for further characterization. Lungs are clear and expanded. No blunting of costophrenic angles. No pneumothorax. IMPRESSION: Right paratracheal and left AP window masses likely representing lymphadenopathy. Consider CT chest for further characterization. Electronically Signed   By: Lucienne Capers M.D.   On: 08/03/2018 23:21   Ct Chest W Contrast  Result Date: 08/04/2018 CLINICAL DATA:  Mediastinal mass on chest radiography, for further characterization. EXAM: CT CHEST WITH CONTRAST TECHNIQUE: Multidetector CT imaging of the chest was performed during intravenous contrast administration. CONTRAST:  78m OMNIPAQUE IOHEXOL 300 MG/ML  SOLN COMPARISON:  08/03/2018 FINDINGS: Cardiovascular: There is considerable extrinsic mass effect on the superior vena cava by the mediastinal mass, lowering the cross-sectional area to about 0.55 cm^2 on image 32/2, with some irregularity along the caval margin which could conceivably represent invasion of the cava or a small amount of marginal thrombus. No large acute pulmonary embolus although today's exam was not timed or performed to assess specifically for pulmonary embolus. There is narrowing of the left upper lobe pulmonary artery due to the mass. Mediastinum/Nodes: Confluent and irregular mediastinal mass also extending into the left hilum and  questionably involving the periphery of the left lung/pleural margin. A representative measurement on image 48/2 measures 10.3 by 6.0 cm. The mass extends around much of the aortic arch and portions the branch vasculature and abuts the left pulmonary artery. Extension in the anterior mediastinum has mass effect on the SVC is noted above. Both internal mammary arteries course through this tumor. The tumor involves the AP window. Left infrahilar node 1.2 cm in short axis on image 61/2. Pathologic supraclavicular adenopathy including a 1.2 cm node on image 9/2. A lower neck level IV lymph node measures 1.4 cm in short axis on image 1/2. Right supraclavicular node 1.0 cm in short axis, image 9/2. Pathologic left axillary and subpectoral adenopathy, index axillary node 2.2 cm in short axis on image 18/2. An upper mediastinal node below the thyroid gland measures 1.9 cm in short axis on image 16/2. Lungs/Pleura: Tumor extends around the left upper lobe tracheobronchial tree centrally. A lung nodule in the left upper lobe medially measures 1.0 by 1.1 cm on image 38/4. Tumor tracks along the pleural space below the left first rib as shown on image 22/2, measuring about 1.7 cm anterior-posterior. Upper Abdomen: Nodular heterogeneous enhancement in the spleen favoring tumor involvement. An index lesion measures 2.5 by 1.9 cm on image 122/2, but multiple splenic nodules are present. There is likely a lymph node in the splenic hilum on image 133/2, the bottom most image. Porta hepatis adenopathy identified including a 1.3 cm peripancreatic node on image 132/2 and a portacaval node measuring 1.5 cm in short axis on image 133/2. A lymph node between the right hemidiaphragmatic crus and the IVC measures 1.2 cm in short axis also on image 132. Hypodense lesion medially in the right hepatic lobe on image 127/2 measures 1.6 by 1.3 cm. Questionable hypodense lesion peripherally in the right hepatic lobe about 0.9 cm in diameter on  image 116/2. Musculoskeletal: Permeative destruction of the sternal manubrium, with tumor and/or hematoma along the anterior and posterior manubrial margins. The tumor rind anterior to the manubrium measures up to about 1.4 cm in thickness on image 97/6. IMPRESSION: 1. 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 In addition there are multiple nodular lesions in  the spleen and possibly in the liver, as well as permeative destruction of the sternal manubrium with tumor rind anterior and posterior to the sternum. 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 Left-sided small cell lung cancer might give a similar appearance. Aggressive germ-cell tumor or thymic carcinoma or less likely differential diagnostic considerations. Tissue diagnosis recommended. 2. The anterior mediastinal mass is causing narrowing of the SVC, and mild invasion of the SVC with slight marginal thrombosis is not readily excluded. No large pulmonary embolus is identified. Electronically Signed   By: Van Clines M.D.   On: 08/04/2018 01:22   Ct Abdomen Pelvis W Contrast  Result Date: 08/05/2018 CLINICAL DATA:  Staging initial workup of Hodgkin's lymphoma EXAM: CT ABDOMEN AND PELVIS WITH CONTRAST TECHNIQUE: Multidetector CT imaging of the abdomen and pelvis was performed using the standard protocol following bolus administration of intravenous contrast. CONTRAST:  138m OMNIPAQUE IOHEXOL 300 MG/ML SOLN, 349mISOVUE-300 IOPAMIDOL (ISOVUE-300) INJECTION 61% COMPARISON:  08/04/1018 FINDINGS: Lower chest: Small left pleural effusion. A lymph node in the right pericardial adipose tissue measures 0.7 cm in short axis on image 7/2 Hepatobiliary: Indistinct hypodense lesion in the lateral segment left hepatic lobe 0.9 by 0.7 cm on image 16/2. Indistinct hypodense lesion posteriorly  in the right hepatic lobe, 1.0 by 0.9 cm on image 23/2. Indistinct hypodense lesion in the right hepatic lobe measuring 1.5 by 1.1 cm on image 18/2. Indistinct hypodense lesion in the right hepatic lobe measuring 1.21.2 cm on image 15/2. These hypodense lesions are not sharply defined and are not characteristic of cysts. Mildly contracted gallbladder. Pancreas: Unremarkable Spleen: Multiple hypodense solid appearing lesions are present in the spleen and suspicious for malignancy. An index lesion laterally in the spleen measures 1.3 by 1.6 cm on image 14/2. At least 15 lesions are present in the spleen. Adrenals/Urinary Tract: Unremarkable Stomach/Bowel: Unremarkable Vascular/Lymphatic: Porta hepatis, portacaval, retroperitoneal, right common iliac, and right external iliac adenopathy noted. Index porta hepatis/peripancreatic node 1.3 cm in short axis on image 21/2. Index aortocaval node 1.2 cm in short axis on image 37/2. Index right common iliac lymph node 1.6 cm in short axis, image 3/2. Reproductive: 7.6 by 4.9 by 4.4 cm primarily fatty mass of the left ovary with some calcification and fluid density elements is compatible with a dermoid cyst. Other: No supplemental non-categorized findings. Musculoskeletal: Unremarkable IMPRESSION: 1. Mild-to-moderate pathologic abdominal and pelvic adenopathy as detailed above. 2. Abnormal hypodense solid-appearing lesions in the liver and spleen, suspicious for malignant involvement. 3. 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 4. Small left pleural effusion. Electronically Signed   By: WaVan Clines.D.   On: 08/05/2018 19:46   Ct Biopsy  Result Date: 08/06/2018 INDICATION: [age]ear old female with new diagnosis of Hodgkin's lymphoma. She presents for CT-guided bone marrow biopsy to complete staging. EXAM: CT GUIDED BONE MARROW ASPIRATION AND CORE BIOPSY Interventional Radiologist:  HeCriselda PeachesMD MEDICATIONS: None. ANESTHESIA/SEDATION:  Moderate (conscious) sedation was employed during this procedure. A total of 2 milligrams versed and 100 micrograms fentanyl were administered intravenously. The patient's level of consciousness and vital signs were monitored continuously by radiology nursing throughout the procedure under my direct supervision. Total monitored sedation time: 11 minutes FLUOROSCOPY TIME:  None COMPLICATIONS: None immediate. Estimated blood loss: <25 mL PROCEDURE: Informed written consent was obtained from the patient after a thorough discussion of the procedural risks, benefits and alternatives. All questions were addressed. Maximal Sterile Barrier Technique was  utilized including caps, mask, sterile gowns, sterile gloves, sterile drape, hand hygiene and skin antiseptic. A timeout was performed prior to the initiation of the procedure. The patient was positioned prone and non-contrast localization CT was performed of the pelvis to demonstrate the iliac marrow spaces. Maximal barrier sterile technique utilized including caps, mask, sterile gowns, sterile gloves, large sterile drape, hand hygiene, and betadine prep. Under sterile conditions and local anesthesia, an 11 gauge coaxial bone biopsy needle was advanced into the right iliac marrow space. Needle position was confirmed with CT imaging. Initially, bone marrow aspiration was performed. Next, the 11 gauge outer cannula was utilized to obtain a right iliac bone marrow core biopsy. Needle was removed. Hemostasis was obtained with compression. The patient tolerated the procedure well. Samples were prepared with the cytotechnologist. IMPRESSION: Technically successful CT-guided bone marrow biopsy and aspiration. Electronically Signed   By: Jacqulynn Cadet M.D.   On: 08/06/2018 12:56   Ct Bone Marrow Biopsy & Aspiration  Result Date: 08/06/2018 INDICATION: 37 year old female with new diagnosis of Hodgkin's lymphoma. She presents for CT-guided bone marrow biopsy to complete  staging. EXAM: CT GUIDED BONE MARROW ASPIRATION AND CORE BIOPSY Interventional Radiologist:  Criselda Peaches, MD MEDICATIONS: None. ANESTHESIA/SEDATION: Moderate (conscious) sedation was employed during this procedure. A total of 2 milligrams versed and 100 micrograms fentanyl were administered intravenously. The patient's level of consciousness and vital signs were monitored continuously by radiology nursing throughout the procedure under my direct supervision. Total monitored sedation time: 11 minutes FLUOROSCOPY TIME:  None COMPLICATIONS: None immediate. Estimated blood loss: <25 mL PROCEDURE: Informed written consent was obtained from the patient after a thorough discussion of the procedural risks, benefits and alternatives. All questions were addressed. Maximal Sterile Barrier Technique was utilized including caps, mask, sterile gowns, sterile gloves, sterile drape, hand hygiene and skin antiseptic. A timeout was performed prior to the initiation of the procedure. The patient was positioned prone and non-contrast localization CT was performed of the pelvis to demonstrate the iliac marrow spaces. Maximal barrier sterile technique utilized including caps, mask, sterile gowns, sterile gloves, large sterile drape, hand hygiene, and betadine prep. Under sterile conditions and local anesthesia, an 11 gauge coaxial bone biopsy needle was advanced into the right iliac marrow space. Needle position was confirmed with CT imaging. Initially, bone marrow aspiration was performed. Next, the 11 gauge outer cannula was utilized to obtain a right iliac bone marrow core biopsy. Needle was removed. Hemostasis was obtained with compression. The patient tolerated the procedure well. Samples were prepared with the cytotechnologist. IMPRESSION: Technically successful CT-guided bone marrow biopsy and aspiration. Electronically Signed   By: Jacqulynn Cadet M.D.   On: 08/06/2018 12:56   [place] Core Biopsy (lymph Nodes)  Result  Date: 08/04/2018 INDICATION: No known primary, now with anterior mediastinal mass and left supraclavicular lymphadenopathy worrisome for lymphoma. Please perform ultrasound-guided supraclavicular lymph node biopsy for tissue diagnostic purposes. EXAM: ULTRASOUND-GUIDED LEFT SUPRACLAVICULAR LYMPH NODE BIOPSY COMPARISON:  Chest CT-earlier same day MEDICATIONS: None ANESTHESIA/SEDATION: Moderate (conscious) sedation was employed during this procedure. A total of Versed 2 mg and Fentanyl 100 mcg was administered intravenously. Moderate Sedation Time: 10 minutes. The patient's level of consciousness and vital signs were monitored continuously by radiology nursing throughout the procedure under my direct supervision. COMPLICATIONS: None immediate. TECHNIQUE: Informed written consent was obtained from the patient after a discussion of the risks, benefits and alternatives to treatment. Questions regarding the procedure were encouraged and answered. Initial ultrasound scanning demonstrated multiple pathologically enlarged  left cervical lymph nodes. Dominant nodal conglomeration within the left supraclavicular fossa measuring approximately 1.3 x 1.5 cm was targeted for biopsy given lymph node location and sonographic window (image 14). An ultrasound image was saved for documentation purposes. The procedure was planned. A timeout was performed prior to the initiation of the procedure. The operative was prepped and draped in the usual sterile fashion, and a sterile drape was applied covering the operative field. A timeout was performed prior to the initiation of the procedure. Local anesthesia was provided with 1% lidocaine with epinephrine. Under direct ultrasound guidance, an 18 gauge core needle device was utilized to obtain to obtain 6 core needle biopsies of the dominant left supraclavicular nodal conglomeration. The samples were placed in saline and submitted to pathology. The needle was removed and hemostasis was  achieved with manual compression. Post procedure scan was negative for significant hematoma. A dressing was placed. The patient tolerated the procedure well without immediate postprocedural complication. IMPRESSION: Technically successful ultrasound guided biopsy of dominant left supraclavicular nodal conglomeration. Electronically Signed   By: Sandi Mariscal M.D.   On: 08/04/2018 16:50    ASSESSMENT & PLAN:  37 y.o. female with   1. 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 LDH - no significantly elevated. 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 With cytopenias concern for BM involvement with Hodgkins lymphoma  2. 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 SVC compression due to anterior mediastinal mass - no upper extremity , neck or face swelling or change in visiion to suggest overt SVC syndrome  4. Microcytic anemia - normal iron. Likely anemia or chronic disease . RecentLabs       Lab Results  Component Value Date   IRON 23 (L) 08/04/2018   TIBC 196 (L) 08/04/2018   IRONPCTSAT 12 08/04/2018     (Iron and TIBC)  RecentLabs       Lab Results  Component Value Date   FERRITIN 91 08/04/2018     5. B12 deficiency ? Pernicious anemia  PLAN -08/05/18 ECHO revealed LV EF of 65-70%. No pericardial effusionCT A/P ordered and reviewed -Discussed that BM bx will help to finalize the staging, but regardless, treatment will be with curative intent, though there is concern for Stage IV with suppressed blood counts  -Will coordinate PET/CT as outpatient -Discussed temporary picc line placement for the first two cycles, then possibly port placement after that, given current burden of disease in  chest -Will look to expedite treatment considerations  -Dexamethasone high dose to start shrinking mediastinal mass to reduce risk of SVC syndrome. -Discussed pt labwork today, 08/06/18; HGB improved to 11.1, blood counts and chemistries are otherwise stable  -Discussed the final pathology with the pathologist which did confirm classical Hodgkin's lymphoma. The BM Bx pathology report is pending.  -Reviewed pulmonary function testing -The pt does not prefer any fertility preservation strategies- declined lupron. -Pt has been set up for chemotherapy counseling class  -Pt will begin C1 A-AVD outpatient on Monday 08/09/18 -Recommended that the pt avoid bending over as this has caused her to feel dizzy and could be from mass compressing her veins -Stressed the importance of the pt staying well hydrated  -Would prefer Midline line placement today or tomorrow before discharge -Considering preventative low dose Xarelto 71m po  daily at discharge for DVT prophylaxis in the setting of SVC narrwing. -Vitamin B12 replacement   -chemo-counseling scheduled for Monday. I wnet over the treatment as well. -patient given material to read about Hodgkins lymphoma.  The total time spent in the appt was 40 minutes and more than 50% was on counseling and direct patient cares.     Sullivan Lone MD MS AAHIVMS [facility] Hematology/Oncology Physician [facility]  (Office):       [phone] (Work cell):  [phone] (Fax):           [phone]  08/06/2018 3:17 PM   I, Baldwin [place], am acting as a scribe for Dr. Irene Limbo  .I have reviewed the above documentation for accuracy and completeness, and I agree with the above. Sullivan Lone MD MS

## 2018-08-06 NOTE — Procedures (Signed)
Interventional Radiology Procedure Note  Procedure: CT guided aspirate and core biopsy of right iliac bone Complications: None Recommendations: - Bedrest supine x 1 hrs - Hydrocodone PRN  Pain - Follow biopsy results  Signed,  Alireza Pollack K. Shawne Eskelson, MD   

## 2018-08-07 ENCOUNTER — Other Ambulatory Visit: Payer: Self-pay | Admitting: Hematology

## 2018-08-07 DIAGNOSIS — C8198 Hodgkin lymphoma, unspecified, lymph nodes of multiple sites: Secondary | ICD-10-CM

## 2018-08-07 LAB — INTRINSIC FACTOR ANTIBODIES: INTRINSIC FACTOR: 6 [AU]/ml — AB (ref 0.0–1.1)

## 2018-08-07 NOTE — Progress Notes (Signed)
PET/CT ordered. RTC with Dr Irene Limbo in 7-9 days s/p C1D1 of treatment on 08/09/2018.  Cassandra Clark

## 2018-08-09 ENCOUNTER — Ambulatory Visit: Payer: Self-pay

## 2018-08-09 ENCOUNTER — Encounter: Payer: Self-pay | Admitting: *Deleted

## 2018-08-09 ENCOUNTER — Inpatient Hospital Stay: Payer: Medicaid Other

## 2018-08-09 ENCOUNTER — Other Ambulatory Visit: Payer: Self-pay | Admitting: Hematology

## 2018-08-09 ENCOUNTER — Encounter: Payer: Self-pay | Admitting: Hematology

## 2018-08-09 ENCOUNTER — Inpatient Hospital Stay: Payer: Medicaid Other | Attending: Hematology

## 2018-08-09 VITALS — BP 111/73 | HR 57 | Temp 98.1°F | Resp 17

## 2018-08-09 DIAGNOSIS — C8198 Hodgkin lymphoma, unspecified, lymph nodes of multiple sites: Secondary | ICD-10-CM

## 2018-08-09 DIAGNOSIS — I871 Compression of vein: Secondary | ICD-10-CM | POA: Diagnosis not present

## 2018-08-09 DIAGNOSIS — Z7189 Other specified counseling: Secondary | ICD-10-CM

## 2018-08-09 DIAGNOSIS — E538 Deficiency of other specified B group vitamins: Secondary | ICD-10-CM | POA: Diagnosis not present

## 2018-08-09 DIAGNOSIS — Z79899 Other long term (current) drug therapy: Secondary | ICD-10-CM | POA: Insufficient documentation

## 2018-08-09 DIAGNOSIS — D649 Anemia, unspecified: Secondary | ICD-10-CM | POA: Diagnosis not present

## 2018-08-09 DIAGNOSIS — K769 Liver disease, unspecified: Secondary | ICD-10-CM | POA: Insufficient documentation

## 2018-08-09 DIAGNOSIS — Z7901 Long term (current) use of anticoagulants: Secondary | ICD-10-CM | POA: Insufficient documentation

## 2018-08-09 DIAGNOSIS — J90 Pleural effusion, not elsewhere classified: Secondary | ICD-10-CM | POA: Insufficient documentation

## 2018-08-09 LAB — COMPREHENSIVE METABOLIC PANEL
ALK PHOS: 68 U/L (ref 38–126)
ALT: 28 U/L (ref 0–44)
AST: 41 U/L (ref 15–41)
Albumin: 3.3 g/dL — ABNORMAL LOW (ref 3.5–5.0)
Anion gap: 10 (ref 5–15)
BUN: 20 mg/dL (ref 6–20)
CALCIUM: 9.3 mg/dL (ref 8.9–10.3)
CO2: 25 mmol/L (ref 22–32)
CREATININE: 0.69 mg/dL (ref 0.44–1.00)
Chloride: 107 mmol/L (ref 98–111)
Glucose, Bld: 107 mg/dL — ABNORMAL HIGH (ref 70–99)
Potassium: 4.1 mmol/L (ref 3.5–5.1)
Sodium: 142 mmol/L (ref 135–145)
TOTAL PROTEIN: 7.9 g/dL (ref 6.5–8.1)
Total Bilirubin: 0.3 mg/dL (ref 0.3–1.2)

## 2018-08-09 LAB — TYPE AND SCREEN
ABO/RH(D): O POS
Antibody Screen: NEGATIVE
UNIT DIVISION: 0
Unit division: 0

## 2018-08-09 LAB — CBC WITH DIFFERENTIAL/PLATELET
ABS IMMATURE GRANULOCYTES: 0.05 10*3/uL (ref 0.00–0.07)
BASOS PCT: 0 %
Basophils Absolute: 0 10*3/uL (ref 0.0–0.1)
EOS PCT: 0 %
Eosinophils Absolute: 0 10*3/uL (ref 0.0–0.5)
HCT: 40.1 % (ref 36.0–46.0)
Hemoglobin: 12.4 g/dL (ref 12.0–15.0)
Immature Granulocytes: 1 %
Lymphocytes Relative: 6 %
Lymphs Abs: 0.6 10*3/uL — ABNORMAL LOW (ref 0.7–4.0)
MCH: 24.6 pg — AB (ref 26.0–34.0)
MCHC: 30.9 g/dL (ref 30.0–36.0)
MCV: 79.6 fL — AB (ref 80.0–100.0)
MONO ABS: 0.8 10*3/uL (ref 0.1–1.0)
Monocytes Relative: 8 %
NEUTROS ABS: 8.2 10*3/uL — AB (ref 1.7–7.7)
Neutrophils Relative %: 85 %
Platelets: 536 10*3/uL — ABNORMAL HIGH (ref 150–400)
RBC: 5.04 MIL/uL (ref 3.87–5.11)
RDW: 19.1 % — ABNORMAL HIGH (ref 11.5–15.5)
WBC: 9.6 10*3/uL (ref 4.0–10.5)
nRBC: 0 % (ref 0.0–0.2)

## 2018-08-09 LAB — BPAM RBC
BLOOD PRODUCT EXPIRATION DATE: 201911032359
Blood Product Expiration Date: 201911032359
ISSUE DATE / TIME: 201910110204
ISSUE DATE / TIME: 201910110438
UNIT TYPE AND RH: 5100
Unit Type and Rh: 5100

## 2018-08-09 LAB — ANTI-PARIETAL ANTIBODY: PARIETAL CELL ANTIBODY-IGG: 12.4 U (ref 0.0–20.0)

## 2018-08-09 LAB — MAGNESIUM: Magnesium: 2.2 mg/dL (ref 1.7–2.4)

## 2018-08-09 MED ORDER — SODIUM CHLORIDE 0.9 % IV SOLN
Freq: Once | INTRAVENOUS | Status: AC
  Administered 2018-08-09: 10:00:00 via INTRAVENOUS
  Filled 2018-08-09: qty 250

## 2018-08-09 MED ORDER — PALONOSETRON HCL INJECTION 0.25 MG/5ML
INTRAVENOUS | Status: AC
  Filled 2018-08-09: qty 5

## 2018-08-09 MED ORDER — SODIUM CHLORIDE 0.9 % IV SOLN
375.0000 mg/m2 | Freq: Once | INTRAVENOUS | Status: AC
  Administered 2018-08-09: 690 mg via INTRAVENOUS
  Filled 2018-08-09: qty 69

## 2018-08-09 MED ORDER — LIDOCAINE-PRILOCAINE 2.5-2.5 % EX CREA: TOPICAL_CREAM | CUTANEOUS | 3 refills | Status: DC

## 2018-08-09 MED ORDER — SODIUM CHLORIDE 0.9 % IV SOLN
1.2000 mg/kg | Freq: Once | INTRAVENOUS | Status: AC
  Administered 2018-08-09: 85 mg via INTRAVENOUS
  Filled 2018-08-09: qty 17

## 2018-08-09 MED ORDER — PROCHLORPERAZINE MALEATE 10 MG PO TABS: 10.0000 mg | ORAL_TABLET | Freq: Four times a day (QID) | ORAL | 1 refills | Status: DC | PRN

## 2018-08-09 MED ORDER — SODIUM CHLORIDE 0.9 % IV SOLN
Freq: Once | INTRAVENOUS | Status: AC
  Administered 2018-08-09: 11:00:00 via INTRAVENOUS
  Filled 2018-08-09: qty 5

## 2018-08-09 MED ORDER — PALONOSETRON HCL INJECTION 0.25 MG/5ML
0.2500 mg | Freq: Once | INTRAVENOUS | Status: AC
  Administered 2018-08-09: 0.25 mg via INTRAVENOUS

## 2018-08-09 MED ORDER — DEXAMETHASONE 4 MG PO TABS: ORAL_TABLET | ORAL | 1 refills | Status: DC

## 2018-08-09 MED ORDER — ONDANSETRON HCL 8 MG PO TABS: 8.0000 mg | ORAL_TABLET | Freq: Two times a day (BID) | ORAL | 1 refills | Status: DC | PRN

## 2018-08-09 MED ORDER — DIPHENHYDRAMINE HCL 25 MG PO CAPS
ORAL_CAPSULE | ORAL | Status: AC
  Filled 2018-08-09: qty 1

## 2018-08-09 MED ORDER — DOXORUBICIN HCL CHEMO IV INJECTION 2 MG/ML
25.0000 mg/m2 | Freq: Once | INTRAVENOUS | Status: AC
  Administered 2018-08-09: 46 mg via INTRAVENOUS
  Filled 2018-08-09: qty 23

## 2018-08-09 MED ORDER — DIPHENHYDRAMINE HCL 25 MG PO TABS
25.0000 mg | ORAL_TABLET | Freq: Once | ORAL | Status: AC
  Administered 2018-08-09: 25 mg via ORAL
  Filled 2018-08-09: qty 1

## 2018-08-09 MED ORDER — ACETAMINOPHEN 325 MG PO TABS
650.0000 mg | ORAL_TABLET | Freq: Once | ORAL | Status: AC
  Administered 2018-08-09: 650 mg via ORAL

## 2018-08-09 MED ORDER — LORAZEPAM 0.5 MG PO TABS: 0.5000 mg | ORAL_TABLET | Freq: Four times a day (QID) | ORAL | 0 refills | Status: DC | PRN

## 2018-08-09 MED ORDER — HEPARIN SOD (PORK) LOCK FLUSH 100 UNIT/ML IV SOLN
250.0000 [IU] | Freq: Once | INTRAVENOUS | Status: AC | PRN
  Administered 2018-08-09: 250 [IU]
  Filled 2018-08-09: qty 5

## 2018-08-09 MED ORDER — VINBLASTINE SULFATE CHEMO INJECTION 1 MG/ML
6.0000 mg/m2 | Freq: Once | INTRAVENOUS | Status: AC
  Administered 2018-08-09: 11 mg via INTRAVENOUS
  Filled 2018-08-09: qty 11

## 2018-08-09 MED ORDER — RIVAROXABAN 10 MG PO TABS: 10.0000 mg | ORAL_TABLET | Freq: Every day | ORAL | 1 refills | Status: DC

## 2018-08-09 MED ORDER — ACETAMINOPHEN 325 MG PO TABS
ORAL_TABLET | ORAL | Status: AC
  Filled 2018-08-09: qty 2

## 2018-08-09 MED ORDER — SODIUM CHLORIDE 0.9% FLUSH
10.0000 mL | INTRAVENOUS | Status: DC | PRN
  Administered 2018-08-09: 10 mL
  Filled 2018-08-09: qty 10

## 2018-08-09 MED FILL — DEXAMETHASONE 4 MG TABLET: 4 | 9 days supply | Qty: 30 | Fill #0

## 2018-08-09 MED FILL — LIDOCAINE-PRILOCAINE CREAM: 2.5-2.5 | 30 days supply | Qty: 30 | Fill #0

## 2018-08-09 MED FILL — ONDANSETRON HCL 8 MG TABLET: 8 | 15 days supply | Qty: 30 | Fill #0

## 2018-08-09 MED FILL — PROCHLORPERAZINE 10 MG TAB: 10 | 8 days supply | Qty: 30 | Fill #0

## 2018-08-09 MED FILL — XARELTO 10 MG TABLET: 10 | 30 days supply | Qty: 30 | Fill #0

## 2018-08-09 MED FILL — LORazepam 0.5 MG TABS: 0.5 | 8 days supply | Qty: 30 | Fill #0

## 2018-08-09 NOTE — Patient Instructions (Signed)
[facility] Discharge Instructions for Patients Receiving Chemotherapy  Today you received the following chemotherapy agents Doxirubicin, Dacarbazine, Vinblastine and Bleomycin  To help prevent nausea and vomiting after your treatment, we encourage you to take your nausea medication as prescribed.    If you develop nausea and vomiting that is not controlled by your nausea medication, call the clinic.   BELOW ARE SYMPTOMS THAT SHOULD BE REPORTED IMMEDIATELY:  *FEVER GREATER THAN 100.5 F  *CHILLS WITH OR WITHOUT FEVER  NAUSEA AND VOMITING THAT IS NOT CONTROLLED WITH YOUR NAUSEA MEDICATION  *UNUSUAL SHORTNESS OF BREATH  *UNUSUAL BRUISING OR BLEEDING  TENDERNESS IN MOUTH AND THROAT WITH OR WITHOUT PRESENCE OF ULCERS  *URINARY PROBLEMS  *BOWEL PROBLEMS  UNUSUAL RASH Items with * indicate a potential emergency and should be followed up as soon as possible.  Feel free to call the clinic should you have any questions or concerns. The clinic phone number is [phone].  Please show the [place] at check-in to the Emergency Department and triage nurse.  Vincristine injection What is this medicine? VINCRISTINE (vin KRIS teen) is a chemotherapy drug. It slows the growth of cancer cells. This medicine is used to treat many types of cancer like Hodgkin's disease, leukemia, non-Hodgkin's lymphoma, neuroblastoma (brain cancer), rhabdomyosarcoma, and Wilms' tumor. This medicine may be used for other purposes; ask your health care provider or pharmacist if you have questions. COMMON BRAND NAME(S): Oncovin, Vincasar PFS What should I tell my health care provider before I take this medicine? They need to know if you have any of these conditions: -blood disorders -gout -infection (especially chickenpox, cold sores, or herpes) -kidney disease -liver disease -lung disease -nervous system disease like Charcot-Marie-Tooth (CMT) -recent or ongoing radiation  therapy -an unusual or allergic reaction to vincristine, other chemotherapy agents, other medicines, foods, dyes, or preservatives -pregnant or trying to get pregnant -breast-feeding How should I use this medicine? This drug is given as an infusion into a vein. It is administered in a hospital or clinic by a specially trained health care professional. If you have pain, swelling, burning, or any unusual feeling around the site of your injection, tell your health care professional right away. Talk to your pediatrician regarding the use of this medicine in children. While this drug may be prescribed for selected conditions, precautions do apply. Overdosage: If you think you have taken too much of this medicine contact a poison control center or emergency room at once. NOTE: This medicine is only for you. Do not share this medicine with others. What if I miss a dose? It is important not to miss your dose. Call your doctor or health care professional if you are unable to keep an appointment. What may interact with this medicine? Do not take this medicine with any of the following medications: -itraconazole -mibefradil -voriconazole This medicine may also interact with the following medications: -cyclosporine -erythromycin -fluconazole -ketoconazole -medicines for HIV like delavirdine, efavirenz, nevirapine -medicines for seizures like ethotoin, fosphenotoin, phenytoin -medicines to increase blood counts like filgrastim, pegfilgrastim, sargramostim -other chemotherapy drugs like cisplatin, L-asparaginase, methotrexate, mitomycin, paclitaxel -pegaspargase -vaccines -zalcitabine, ddC Talk to your doctor or health care professional before taking any of these medicines: -acetaminophen -aspirin -ibuprofen -ketoprofen -naproxen This list may not describe all possible interactions. Give your health care provider a list of all the medicines, herbs, non-prescription drugs, or dietary supplements  you use. Also tell them if you smoke, drink alcohol, or use illegal drugs. Some items may  interact with your medicine. What should I watch for while using this medicine? Your condition will be monitored carefully while you are receiving this medicine. You will need important blood work done while you are taking this medicine. This drug may make you feel generally unwell. This is not uncommon, as chemotherapy can affect healthy cells as well as cancer cells. Report any side effects. Continue your course of treatment even though you feel ill unless your doctor tells you to stop. In some cases, you may be given additional medicines to help with side effects. Follow all directions for their use. Call your doctor or health care professional for advice if you get a fever, chills or sore throat, or other symptoms of a cold or flu. Do not treat yourself. Avoid taking products that contain aspirin, acetaminophen, ibuprofen, naproxen, or ketoprofen unless instructed by your doctor. These medicines may hide a fever. Do not become pregnant while taking this medicine. Women should inform their doctor if they wish to become pregnant or think they might be pregnant. There is a potential for serious side effects to an unborn child. Talk to your health care professional or pharmacist for more information. Do not breast-feed an infant while taking this medicine. Men may have a lower sperm count while taking this medicine. Talk to your doctor if you plan to father a child. What side effects may I notice from receiving this medicine? Side effects that you should report to your doctor or health care professional as soon as possible: -allergic reactions like skin rash, itching or hives, swelling of the face, lips, or tongue -breathing problems -confusion or changes in emotions or moods -constipation -cough -mouth sores -muscle weakness -nausea and vomiting -pain, swelling, redness or irritation at the injection  site -pain, tingling, numbness in the hands or feet -problems with balance, talking, walking -seizures -stomach pain -trouble passing urine or change in the amount of urine Side effects that usually do not require medical attention (report to your doctor or health care professional if they continue or are bothersome): -diarrhea -hair loss -jaw pain -loss of appetite This list may not describe all possible side effects. Call your doctor for medical advice about side effects. You may report side effects to FDA at 1-800-FDA-1088. Where should I keep my medicine? This drug is given in a hospital or clinic and will not be stored at home. NOTE: This sheet is a summary. It may not cover all possible information. If you have questions about this medicine, talk to your doctor, pharmacist, or health care provider.  2018 Elsevier/Gold Standard (2008-07-10 17:17:13)   Doxorubicin injection What is this medicine? DOXORUBICIN (dox oh ROO bi sin) is a chemotherapy drug. It is used to treat many kinds of cancer like leukemia, lymphoma, neuroblastoma, sarcoma, and Wilms' tumor. It is also used to treat bladder cancer, breast cancer, lung cancer, ovarian cancer, stomach cancer, and thyroid cancer. This medicine may be used for other purposes; ask your health care provider or pharmacist if you have questions. COMMON BRAND NAME(S): Adriamycin, Adriamycin PFS, Adriamycin RDF, Rubex What should I tell my health care provider before I take this medicine? They need to know if you have any of these conditions: -heart disease -history of low blood counts caused by a medicine -liver disease -recent or ongoing radiation therapy -an unusual or allergic reaction to doxorubicin, other chemotherapy agents, other medicines, foods, dyes, or preservatives -pregnant or trying to get pregnant -breast-feeding How should I use this medicine? This drug is  given as an infusion into a vein. It is administered in a hospital  or clinic by a specially trained health care professional. If you have pain, swelling, burning or any unusual feeling around the site of your injection, tell your health care professional right away. Talk to your pediatrician regarding the use of this medicine in children. Special care may be needed. Overdosage: If you think you have taken too much of this medicine contact a poison control center or emergency room at once. NOTE: This medicine is only for you. Do not share this medicine with others. What if I miss a dose? It is important not to miss your dose. Call your doctor or health care professional if you are unable to keep an appointment. What may interact with this medicine? This medicine may interact with the following medications: -6-mercaptopurine -paclitaxel -phenytoin -St. John's Wort -trastuzumab -verapamil This list may not describe all possible interactions. Give your health care provider a list of all the medicines, herbs, non-prescription drugs, or dietary supplements you use. Also tell them if you smoke, drink alcohol, or use illegal drugs. Some items may interact with your medicine. What should I watch for while using this medicine? This drug may make you feel generally unwell. This is not uncommon, as chemotherapy can affect healthy cells as well as cancer cells. Report any side effects. Continue your course of treatment even though you feel ill unless your doctor tells you to stop. There is a maximum amount of this medicine you should receive throughout your life. The amount depends on the medical condition being treated and your overall health. Your doctor will watch how much of this medicine you receive in your lifetime. Tell your doctor if you have taken this medicine before. You may need blood work done while you are taking this medicine. Your urine may turn red for a few days after your dose. This is not blood. If your urine is dark or brown, call your doctor. In some  cases, you may be given additional medicines to help with side effects. Follow all directions for their use. Call your doctor or health care professional for advice if you get a fever, chills or sore throat, or other symptoms of a cold or flu. Do not treat yourself. This drug decreases your body's ability to fight infections. Try to avoid being around people who are sick. This medicine may increase your risk to bruise or bleed. Call your doctor or health care professional if you notice any unusual bleeding. Talk to your doctor about your risk of cancer. You may be more at risk for certain types of cancers if you take this medicine. Do not become pregnant while taking this medicine or for 6 months after stopping it. Women should inform their doctor if they wish to become pregnant or think they might be pregnant. Men should not father a child while taking this medicine and for 6 months after stopping it. There is a potential for serious side effects to an unborn child. Talk to your health care professional or pharmacist for more information. Do not breast-feed an infant while taking this medicine. This medicine has caused ovarian failure in some women and reduced sperm counts in some men This medicine may interfere with the ability to have a child. Talk with your doctor or health care professional if you are concerned about your fertility. What side effects may I notice from receiving this medicine? Side effects that you should report to your doctor or health care  professional as soon as possible: -allergic reactions like skin rash, itching or hives, swelling of the face, lips, or tongue -breathing problems -chest pain -fast or irregular heartbeat -low blood counts - this medicine may decrease the number of white blood cells, red blood cells and platelets. You may be at increased risk for infections and bleeding. -pain, redness, or irritation at site where injected -signs of infection - fever or chills,  cough, sore throat, pain or difficulty passing urine -signs of decreased platelets or bleeding - bruising, pinpoint red spots on the skin, black, tarry stools, blood in the urine -swelling of the ankles, feet, hands -tiredness -weakness Side effects that usually do not require medical attention (report to your doctor or health care professional if they continue or are bothersome): -diarrhea -hair loss -mouth sores -nail discoloration or damage -nausea -red colored urine -vomiting This list may not describe all possible side effects. Call your doctor for medical advice about side effects. You may report side effects to FDA at 1-800-FDA-1088. Where should I keep my medicine? This drug is given in a hospital or clinic and will not be stored at home. NOTE: This sheet is a summary. It may not cover all possible information. If you have questions about this medicine, talk to your doctor, pharmacist, or health care provider.  2018 Elsevier/Gold Standard (2015-12-10 11:28:51)   Bleomycin injection What is this medicine? BLEOMYCIN (blee oh MYE sin) is a chemotherapy drug. It is used to treat many kinds of cancer like lymphoma, cervical cancer, head and neck cancer, and testicular cancer. It is also used to prevent and to treat fluid build-up around the lungs caused by some cancers. This medicine may be used for other purposes; ask your health care provider or pharmacist if you have questions. COMMON BRAND NAME(S): Blenoxane What should I tell my health care provider before I take this medicine? They need to know if you have any of these conditions: -cigarette smoker -kidney disease -lung disease -recent or ongoing radiation therapy -an unusual or allergic reaction to bleomycin, other chemotherapy agents, other medicines, foods, dyes, or preservatives -pregnant or trying to get pregnant -breast-feeding How should I use this medicine? This drug is given as an infusion into a vein or a body  cavity. It can also be given as an injection into a muscle or under the skin. It is administered in a hospital or clinic by a specially trained health care professional. Talk to your pediatrician regarding the use of this medicine in children. Special care may be needed. Overdosage: If you think you have taken too much of this medicine contact a poison control center or emergency room at once. NOTE: This medicine is only for you. Do not share this medicine with others. What if I miss a dose? It is important not to miss your dose. Call your doctor or health care professional if you are unable to keep an appointment. What may interact with this medicine? -certain antibiotics given by injection -cisplatin -cyclosporine -diuretics -foscarnet -medicines to increase blood counts like filgrastim, pegfilgrastim, sargramostim -vaccines This list may not describe all possible interactions. Give your health care provider a list of all the medicines, herbs, non-prescription drugs, or dietary supplements you use. Also tell them if you smoke, drink alcohol, or use illegal drugs. Some items may interact with your medicine. What should I watch for while using this medicine? Visit your doctor for checks on your progress. This drug may make you feel generally unwell. This is not uncommon,  as chemotherapy can affect healthy cells as well as cancer cells. Report any side effects. Continue your course of treatment even though you feel ill unless your doctor tells you to stop. Call your doctor or health care professional for advice if you get a fever, chills or sore throat, or other symptoms of a cold or flu. Do not treat yourself. This drug decreases your body's ability to fight infections. Try to avoid being around people who are sick. Avoid taking products that contain aspirin, acetaminophen, ibuprofen, naproxen, or ketoprofen unless instructed by your doctor. These medicines may hide a fever. Do not become pregnant  while taking this medicine. Women should inform their doctor if they wish to become pregnant or think they might be pregnant. There is a potential for serious side effects to an unborn child. Talk to your health care professional or pharmacist for more information. Do not breast-feed an infant while taking this medicine. There is a maximum amount of this medicine you should receive throughout your life. The amount depends on the medical condition being treated and your overall health. Your doctor will watch how much of this medicine you receive in your lifetime. Tell your doctor if you have taken this medicine before. What side effects may I notice from receiving this medicine? Side effects that you should report to your doctor or health care professional as soon as possible: -allergic reactions like skin rash, itching or hives, swelling of the face, lips, or tongue -breathing problems -chest pain -confusion -cough -fast, irregular heartbeat -feeling faint or lightheaded, falls -fever or chills -mouth sores -pain, tingling, numbness in the hands or feet -trouble passing urine or change in the amount of urine -yellowing of the eyes or skin Side effects that usually do not require medical attention (report to your doctor or health care professional if they continue or are bothersome): -darker skin color -hair loss -irritation at site where injected -loss of appetite -nail changes -nausea and vomiting -weight loss This list may not describe all possible side effects. Call your doctor for medical advice about side effects. You may report side effects to FDA at 1-800-FDA-1088. Where should I keep my medicine? This drug is given in a hospital or clinic and will not be stored at home. NOTE: This sheet is a summary. It may not cover all possible information. If you have questions about this medicine, talk to your doctor, pharmacist, or health care provider.  2018 Elsevier/Gold Standard  (2013-02-08 09:36:48)   Dacarbazine, DTIC injection What is this medicine? DACARBAZINE (da KAR ba zeen) is a chemotherapy drug. This medicine is used to treat skin cancer. It is also used with other medicines to treat Hodgkin's disease. This medicine may be used for other purposes; ask your health care provider or pharmacist if you have questions. COMMON BRAND NAME(S): DTIC-Dome What should I tell my health care provider before I take this medicine? They need to know if you have any of these conditions: -infection (especially virus infection such as chickenpox, cold sores, or herpes) -kidney disease -liver disease -low blood counts like low platelets, red blood cells, white blood cells -recent radiation therapy -an unusual or allergic reaction to dacarbazine, other chemotherapy agents, other medicines, foods, dyes, or preservatives -pregnant or trying to get pregnant -breast-feeding How should I use this medicine? This drug is given as an injection or infusion into a vein. It is administered in a hospital or clinic by a specially trained health care professional. Talk to your pediatrician regarding the  use of this medicine in children. While this drug may be prescribed for selected conditions, precautions do apply. Overdosage: If you think you have taken too much of this medicine contact a poison control center or emergency room at once. NOTE: This medicine is only for you. Do not share this medicine with others. What if I miss a dose? It is important not to miss your dose. Call your doctor or health care professional if you are unable to keep an appointment. What may interact with this medicine? -medicines to increase blood counts like filgrastim, pegfilgrastim, sargramostim -vaccines This list may not describe all possible interactions. Give your health care provider a list of all the medicines, herbs, non-prescription drugs, or dietary supplements you use. Also tell them if you smoke,  drink alcohol, or use illegal drugs. Some items may interact with your medicine. What should I watch for while using this medicine? Your condition will be monitored carefully while you are receiving this medicine. You will need important blood work done while you are taking this medicine. This drug may make you feel generally unwell. This is not uncommon, as chemotherapy can affect healthy cells as well as cancer cells. Report any side effects. Continue your course of treatment even though you feel ill unless your doctor tells you to stop. Call your doctor or health care professional for advice if you get a fever, chills or sore throat, or other symptoms of a cold or flu. Do not treat yourself. This drug decreases your body's ability to fight infections. Try to avoid being around people who are sick. This medicine may increase your risk to bruise or bleed. Call your doctor or health care professional if you notice any unusual bleeding. Talk to your doctor about your risk of cancer. You may be more at risk for certain types of cancers if you take this medicine. Do not become pregnant while taking this medicine. Women should inform their doctor if they wish to become pregnant or think they might be pregnant. There is a potential for serious side effects to an unborn child. Talk to your health care professional or pharmacist for more information. Do not breast-feed an infant while taking this medicine. What side effects may I notice from receiving this medicine? Side effects that you should report to your doctor or health care professional as soon as possible: -allergic reactions like skin rash, itching or hives, swelling of the face, lips, or tongue -low blood counts - this medicine may decrease the number of white blood cells, red blood cells and platelets. You may be at increased risk for infections and bleeding. -signs of infection - fever or chills, cough, sore throat, pain or difficulty passing  urine -signs of decreased platelets or bleeding - bruising, pinpoint red spots on the skin, black, tarry stools, blood in the urine -signs of decreased red blood cells - unusually weak or tired, fainting spells, lightheadedness -breathing problems -muscle pains -pain at site where injected -trouble passing urine or change in the amount of urine -vomiting -yellowing of the eyes or skin Side effects that usually do not require medical attention (report to your doctor or health care professional if they continue or are bothersome): -diarrhea -hair loss -loss of appetite -nausea -skin more sensitive to sun or ultraviolet light -stomach upset This list may not describe all possible side effects. Call your doctor for medical advice about side effects. You may report side effects to FDA at 1-800-FDA-1088. Where should I keep my medicine? This drug  is given in a hospital or clinic and will not be stored at home. NOTE: This sheet is a summary. It may not cover all possible information. If you have questions about this medicine, talk to your doctor, pharmacist, or health care provider.  2018 Elsevier/Gold Standard (2015-12-14 15:17:39)

## 2018-08-09 NOTE — Progress Notes (Signed)
Went to infusion to visit uninsured new patient from recent hospital admission. Introduced myself as Arboriculturist and discussed available resources.   Patient family member was able to provide a letter of support for one-time $500 [employer]. Patient approved for the grant and has a copy of the expense sheet as well as the Outpatient pharmacy information. Advised patient she may want to reserve funds for oral medications only to be picked up at [facility]. She verbalized understanding. Patient has a Medicaid application pending which is being followed by hospital advocates du to recent admission.  Provided patient with application for Xarelto [facility]. She completed application and provided information. Obtained signature from physician and prescription information. Faxed to [employer]. Fax received ok per confirmation sheet.  Patient had questions regarding seeing a Education officer, museum for disability and obtaining a PCP. Sent staff message to social workers. Gave card for the Patient [place] next door in the [facility] to help establish PCP.  Patient will automatically receive a 55% discount for being uninsured and would have to have a determination from [place] regarding Medicaid before she can apply for any additional discount through the [place].  Patient has my card for any additional financial questions or concerns.

## 2018-08-09 NOTE — Progress Notes (Signed)
Confirmed RN note as below.  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  Once the chemo starts, the SVC compression should improve and MD will order port placement. Kennith Center, Pharm.D., CPP 08/09/2018@12 :25 PM

## 2018-08-09 NOTE — Progress Notes (Signed)
Kennith Center in pharmacy spoke with Dr Irene Limbo re: using PowerGlide midline access to administer her tx today which includes vesicants. Per Dr Irene Limbo there is an increased risk to the pt if using a PIV vs using the PowerGlide midline access, therefore PowerGlide ok to use today and this RN will be checking for good blood return as indicated.

## 2018-08-09 NOTE — Progress Notes (Signed)
[name] representative received referral from Estate manager/land agent.  CSW met with patient and patients cousin in the infusion room to offer support and assess for needs.  Patient stated she was recently diagnosed and primary concern was her pending Medicaid; application was completed while in the hospital, and applying for social security disability.  CSW explained resources provided by the [facility], and patient was agreeable to CSW completing referral.  CSW also provided education on the [facility] support team and support services.  CSW provided contact information and encouraged patient to call with additional needs or concerns.    Johnnye Lana, MSW, LCSW, OSW-C Clinical Social Worker [facility] [phone]

## 2018-08-10 ENCOUNTER — Ambulatory Visit: Payer: Self-pay

## 2018-08-10 ENCOUNTER — Encounter: Payer: Self-pay | Admitting: Pharmacy Technician

## 2018-08-10 NOTE — Progress Notes (Signed)
The patient is approved for drug assistance by [employer] for Udeny[employer]. Enrollment is based on self pay and is effective until 08/11/19. Drug replacement will be requested for begining DOS 08/10/18. The patient also has an application pending to [facility] for Adcetris.

## 2018-08-11 ENCOUNTER — Other Ambulatory Visit: Payer: Self-pay | Admitting: *Deleted

## 2018-08-11 ENCOUNTER — Telehealth: Payer: Self-pay

## 2018-08-11 ENCOUNTER — Ambulatory Visit: Payer: Self-pay

## 2018-08-11 ENCOUNTER — Inpatient Hospital Stay: Payer: Medicaid Other

## 2018-08-11 ENCOUNTER — Telehealth: Payer: Self-pay | Admitting: Hematology

## 2018-08-11 VITALS — BP 115/64 | HR 88 | Temp 97.5°F | Resp 18

## 2018-08-11 DIAGNOSIS — C8198 Hodgkin lymphoma, unspecified, lymph nodes of multiple sites: Secondary | ICD-10-CM

## 2018-08-11 DIAGNOSIS — Z7189 Other specified counseling: Secondary | ICD-10-CM

## 2018-08-11 DIAGNOSIS — I871 Compression of vein: Secondary | ICD-10-CM

## 2018-08-11 MED ORDER — PEGFILGRASTIM-CBQV 6 MG/0.6ML ~~LOC~~ SOSY
PREFILLED_SYRINGE | SUBCUTANEOUS | Status: AC
  Filled 2018-08-11: qty 0.6

## 2018-08-11 MED ORDER — PEGFILGRASTIM-CBQV 6 MG/0.6ML ~~LOC~~ SOSY
6.0000 mg | PREFILLED_SYRINGE | Freq: Once | SUBCUTANEOUS | Status: AC
  Administered 2018-08-11: 6 mg via SUBCUTANEOUS

## 2018-08-11 NOTE — Telephone Encounter (Signed)
Appts scheduled patient notified per 10/16 sch msg °

## 2018-08-11 NOTE — Telephone Encounter (Signed)
Attempt to reach pt to discuss symptoms post first chemo. LVM requesting pt f/u with office and that she should be expecting a call from scheduling to set up lab and doctor visit on Monday, 08/16/18.

## 2018-08-12 ENCOUNTER — Ambulatory Visit: Payer: Self-pay

## 2018-08-13 ENCOUNTER — Ambulatory Visit (HOSPITAL_COMMUNITY)
Admission: RE | Admit: 2018-08-13 | Discharge: 2018-08-13 | Disposition: A | Discharge status: Home / Self Care | Payer: Medicaid Other | Source: Ambulatory Visit | Attending: Hematology | Admitting: Hematology

## 2018-08-13 ENCOUNTER — Ambulatory Visit: Payer: Self-pay

## 2018-08-13 DIAGNOSIS — C8198 Hodgkin lymphoma, unspecified, lymph nodes of multiple sites: Secondary | ICD-10-CM

## 2018-08-13 LAB — GLUCOSE, CAPILLARY: GLUCOSE-CAPILLARY: 118 mg/dL — AB (ref 70–99)

## 2018-08-13 MED ORDER — FLUDEOXYGLUCOSE F - 18 (FDG) INJECTION
8.0100 | Freq: Once | INTRAVENOUS | Status: AC | PRN
  Administered 2018-08-13: 8.01 via INTRAVENOUS

## 2018-08-13 NOTE — Progress Notes (Signed)
HEMATOLOGY/ONCOLOGY CONSULTATION NOTE  Date of Service: 08/16/2018  Patient Care Team: Default, Provider, MD as PCP - General  CHIEF COMPLAINTS/PURPOSE OF CONSULTATION:  F/u for Mx of newly diagnosed Hodgkins lymphoma  HISTORY OF PRESENTING ILLNESS:   Cassandra Clark is a wonderful 37 y.o. female who has been referred to [place] by Dr. 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   Patient has a mild anemia and presented to the emergency room with new onset fevers chills and a 2-week history of worsening night sweats dry cough fatigue and anorexia.  Patient notes that she initially started feeling unwell from late June when she had noted increasing fatigue that required her to rest for longer periods of time.  She also noted that she at times felt lightheaded and dizzy when she bent forward.  She notes a decreased appetite.  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 She notes that she has not been able to do much of anything else other than rest at home and work to some degree at her job.  No chest pain no palpitations no diaphoresis no lower extremity edema.  No abdominal pain no nausea no vomiting no diarrhea.  No rectal bleeding. Notes that her periods have been regular.  In the emergency room the patient was noted to have a fever of 102.4 F and tachycardia related to this.  Blood counts showed that the patient was anemic with a hemoglobin around 8 with normal platelets and a WBC count of 3.6k.  Magnesium 1.9 and phosphorus of 2. She was also noted to be vitamin B12 deficient.  Most recent lab results (08/04/18) of CBC w/diff and CMP is as follows: all values are WNL except for WBC at 3.0k RBC at 3.64, HGB at 8.3, HCT at 82.7, MCV at 78.8, MCH at 22.8, MCHC at 28.9, RDW at 18.1, PLT at 459k, Lymphs abs at 300,  Calcium at 8.0, Albumin at 2.7. 08/04/18 LDH at 203 08/04/18 Vitamin B12 at 135  X-ray of the chest done on 08/03/2018 showed Right paratracheal and left AP window masses likely representing lymphadenopathy. Consider CT chest for further characterization.  CT of the chest with contrast was subsequently done which showed 1. 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 In addition there are multiple nodular lesions in the spleen and possibly in the liver, as well as permeative destruction of the sternal manubrium with tumor rind anterior and posterior to the sternum. 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 Left-sided small cell lung cancer might give a similar appearance. Aggressive germ-cell tumor or thymic carcinoma or less likely differential diagnostic considerations. Tissue diagnosis recommended. 2. The anterior mediastinal mass is causing narrowing of the SVC, and mild invasion of the SVC with slight marginal thrombosis is not readily excluded. No large pulmonary embolus is identified.  Patient had additional lab testing which showed a near normal LDH was 203. Sedimentation rate was done subsequently and was noted to be elevated.  Urine pregnancy test was within normal limits.  Patient was noted to have significant headaches over the last 4 to 6 weeks and had a CT of the head without contrast in the ED on 07/04/2018 which showed no  acute intracranial normalities.  On review of systems, pt reports fevers, unquantified weight loss, shortness of breath, night sweats and denies overt chest pain, rashes.   Interval History:   Cassandra Clark returns today for management, evaluation and C1D15 of her A-AVD treatment of her Hodgkin's Lymphoma. The patient's last visit with [place] was on 08/07/18. She is accompanied today by her father. The  pt reports that she is doing well overall.   The pt reports that she has been eating very well while on steroids and endorses a strong appetite. She is also no longer having any fevers. She notes that she is no longer feeling light headed or dizzy when she bens over or in general. She denies any CP or SOB.   The pt notes that she did have some back pain after receiving her neulasta shot.   Of note since the patient's last visit, pt has had a PET/CT completed on 08/13/18 with results revealing Large hypermetabolic mass within the anterior mediastinum identified compatible with lymphoma, Deauville criteria 5. There is extensive hypermetabolic left supraclavicular, left axillary, left hilar and right iliac adenopathy, Deauville criteria 4 and 5. Additional lesions are identified within the caudate lobe of liver and spleen, Deauville criteria 4. Multifocal hypermetabolic bone lesions are also identified, Deauville criteria 5.   Lab results today (08/16/18) of CBC w/diff, CMP is as follows: all values are WNL except for RBC at 5.33, MCH at 24.4, RDW at 19.3, Glucose at 110, Albumin at 3.3, ALT at 66, Total Bilirubin at 0.2.  On review of systems, pt reports eating well, strong appetite, improved energy levels, moving her bowels well, and denies fevers, CP, SOB, light headedness, dizziness, nausea, mouth sores, right arm pain, problems with the midline, calf pain, abdominal pains, and any other symptoms.   MEDICAL HISTORY:  Past Medical History:  Diagnosis Date  . Anemia     SURGICAL HISTORY: History reviewed. No pertinent surgical history.  SOCIAL HISTORY: Social History   Socioeconomic History  . Marital status: Legally Separated    Spouse name: Not on file  . Number of children: Not on file  . Years of education: Not on file  . Highest education level: Not on file  Occupational History  . Not on file  Social Needs  . Financial resource strain: Not on file  . Food insecurity:    Worry:  Not on file    Inability: Not on file  . Transportation needs:    Medical: Not on file    Non-medical: Not on file  Tobacco Use  . Smoking status: Never Smoker  . Smokeless tobacco: Never Used  Substance and Sexual Activity  . Alcohol use: No  . Drug use: No    Comment: Drinks tea  . Sexual activity: Not on file  Lifestyle  . Physical activity:    Days per week: Not on file    Minutes per session: Not on file  . Stress: Not on file  Relationships  . Social connections:    Talks on phone: Not on file    Gets together: Not on file    Attends religious service: Not on file    Active member of club or organization: Not on file    Attends meetings of clubs or organizations: Not on file    Relationship status: Not on file  . Intimate partner violence:    Fear of current or ex partner: Not on file    Emotionally abused: Not on file  Physically abused: Not on file    Forced sexual activity: Not on file  Other Topics Concern  . Not on file  Social History Narrative  . Not on file    FAMILY HISTORY: Family History  Problem Relation Age of Onset  . Diabetes Mellitus II Father   . Hypertension Father   . Congestive Heart Failure Maternal Grandmother   . Diabetes Mellitus II Maternal Grandmother   . Diabetes Mellitus II Maternal Uncle   . Congestive Heart Failure Maternal Uncle     ALLERGIES:  is allergic to penicillins.  MEDICATIONS:  Current Outpatient Medications  Medication Sig Dispense Refill  . loratadine (CLARITIN) 10 MG tablet Take 10 mg by mouth daily.    . B Complex-C (B-COMPLEX WITH VITAMIN C) tablet Take 1 tablet by mouth daily. 30 tablet 0  . Cyanocobalamin (B-12) 1000 MCG SUBL Place 2,000 mcg under the tongue daily. 60 each 0  . dexamethasone (DECADRON) 4 MG tablet Take 2 tablets by mouth once a day on the day after chemotherapy and then take 2 tablets two times a day for 2 days. Take with food. 30 tablet 1  . ferrous sulfate 325 (65 FE) MG tablet Take 1  tablet (325 mg total) by mouth daily. 14 tablet 0  . lidocaine-prilocaine (EMLA) cream Apply to affected area once 30 g 3  . LORazepam (ATIVAN) 0.5 MG tablet Take 1 tablet (0.5 mg total) by mouth every 6 (six) hours as needed (Nausea or vomiting). 30 tablet 0  . ondansetron (ZOFRAN) 8 MG tablet Take 1 tablet (8 mg total) by mouth 2 (two) times daily as needed. Start on the third day after chemotherapy. 30 tablet 1  . prochlorperazine (COMPAZINE) 10 MG tablet Take 1 tablet (10 mg total) by mouth every 6 (six) hours as needed (Nausea or vomiting). 30 tablet 1  . rivaroxaban (XARELTO) 10 MG TABS tablet Take 1 tablet (10 mg total) by mouth daily. 30 tablet 1   No current facility-administered medications for this visit.     REVIEW OF SYSTEMS:    A 10+ POINT REVIEW OF SYSTEMS WAS OBTAINED including neurology, dermatology, psychiatry, cardiac, respiratory, lymph, extremities, GI, GU, Musculoskeletal, constitutional, breasts, reproductive, HEENT.  All pertinent positives are noted in the HPI.  All others are negative.   PHYSICAL EXAMINATION: ECOG PERFORMANCE STATUS: 1 - Symptomatic but completely ambulatory  . Vitals:   08/16/18 1317  BP: 108/72  Pulse: 96  Resp: 17  Temp: 98.3 F (36.8 C)  SpO2: 100%   Filed Weights   08/16/18 1317  Weight: 158 lb 1.6 oz (71.7 kg)   .Body mass index is 24.76 kg/m.  GENERAL:alert, in no acute distress and comfortable SKIN: no acute rashes, no significant lesions EYES: conjunctiva are pink and non-injected, sclera anicteric OROPHARYNX: MMM, no exudates, no oropharyngeal erythema or ulceration NECK: supple, no JVD LYMPH:  B/l supraclavicular and left axillary lymphadenopathy palpable  LUNGS: clear to auscultation b/l with normal respiratory effort HEART: regular rate & rhythm ABDOMEN:  normoactive bowel sounds , non tender, not distended. No palpable hepatosplenomegaly.  Extremity: trace pedal edema PSYCH: alert & oriented x 3 with fluent  speech NEURO: no focal motor/sensory deficits   LABORATORY DATA:  I have reviewed the data as listed  . CBC Latest Ref Rng & Units 08/16/2018 08/09/2018 08/06/2018  WBC 4.0 - 10.5 K/uL 6.0 9.6 5.7  Hemoglobin 12.0 - 15.0 g/dL 13.0 12.4 11.1(L)  Hematocrit 36.0 - 46.0 % 43.4 40.1 36.2  Platelets  150 - 400 K/uL 360 536(H) 459(H)    . CMP Latest Ref Rng & Units 08/16/2018 08/09/2018 08/06/2018  Glucose 70 - 99 mg/dL 110(H) 107(H) 140(H)  BUN 6 - 20 mg/dL 18 20 7   Creatinine 0.44 - 1.00 mg/dL 0.77 0.69 0.56  Sodium 135 - 145 mmol/L 137 142 140  Potassium 3.5 - 5.1 mmol/L 3.7 4.1 4.1  Chloride 98 - 111 mmol/L 98 107 106  CO2 22 - 32 mmol/L 30 25 24   Calcium 8.9 - 10.3 mg/dL 9.5 9.3 8.4(L)  Total Protein 6.5 - 8.1 g/dL 7.1 7.9 7.1  Total Bilirubin 0.3 - 1.2 mg/dL 0.2(L) 0.3 0.6  Alkaline Phos 38 - 126 U/L 104 68 56  AST 15 - 41 U/L 24 41 15  ALT 0 - 44 U/L 66(H) 28 13   Component     Latest Ref Rng & Units 08/04/2018  Total Protein ELP     6.0 - 8.5 g/dL 6.2  Albumin ELP     2.9 - 4.4 g/dL 2.6 (L)  Alpha-1-Globulin     0.0 - 0.4 g/dL 0.3  Alpha-2-Globulin     0.4 - 1.0 g/dL 0.9  Beta Globulin     0.7 - 1.3 g/dL 1.0  Gamma Globulin     0.4 - 1.8 g/dL 1.4  M-SPIKE, %     Not Observed g/dL Not Observed  SPE Interp.      Comment  Comment      Comment  Globulin, Total     2.2 - 3.9 g/dL 3.6  A/G Ratio     0.7 - 1.7 0.7  Iron     28 - 170 ug/dL 23 (L)  TIBC     250 - 450 ug/dL 196 (L)  Saturation Ratios     10.4 - 31.8 % 12  UIBC     ug/dL 173  Retic Ct Pct     0.4 - 3.1 % 2.0  RBC.     3.87 - 5.11 MIL/uL 3.42 (L)  Retic Count, Absolute     19.0 - 186.0 K/uL 69.8  Immature Retic Fract     2.3 - 15.9 % 29.4 (H)  Hepatitis B Surface Ag     Negative Negative  HCV Ab     0.0 - 0.9 s/co ratio 0.1  Hep A Ab, IgM     Negative Negative  Hep B Core Ab, IgM     Negative Negative  Vitamin B12     180 - 914 pg/mL 135 (L)  Folate     >5.9 ng/mL 11.5  Ferritin      11 - 307 ng/mL 91  Uric Acid, Serum     2.5 - 7.1 mg/dL 4.5  LDH     98 - 192 U/L 203 (H)  AFP, Serum, Tumor Marker     0.0 - 8.3 ng/mL 3.4  CEA     0.0 - 4.7 ng/mL 1.4  Beta-2 Microglobulin     0.6 - 2.4 mg/L 2.4  Magnesium     1.7 - 2.4 mg/dL 2.0  Phosphorus     2.5 - 4.6 mg/dL 2.7    08/04/18 Left Cervical Core Biopsy:   08/06/18 BM Bx:    RADIOGRAPHIC STUDIES: I have personally reviewed the radiological images as listed and agreed with the findings in the report. Dg Chest 2 View  Result Date: 08/03/2018 CLINICAL DATA:  Fever with cough for 1 week. Nausea and vomiting. EXAM: CHEST - 2 VIEW COMPARISON:  None. FINDINGS: Normal heart size and pulmonary vascularity. Right paratracheal and left AP window mediastinal masses likely representing prominent lymphadenopathy. Consider lymphoma. CT chest suggested for further characterization. Lungs are clear and expanded. No blunting of costophrenic angles. No pneumothorax. IMPRESSION: Right paratracheal and left AP window masses likely representing lymphadenopathy. Consider CT chest for further characterization. Electronically Signed   By: Lucienne Capers M.D.   On: 08/03/2018 23:21   Ct Chest W Contrast  Result Date: 08/04/2018 CLINICAL DATA:  Mediastinal mass on chest radiography, for further characterization. EXAM: CT CHEST WITH CONTRAST TECHNIQUE: Multidetector CT imaging of the chest was performed during intravenous contrast administration. CONTRAST:  27m OMNIPAQUE IOHEXOL 300 MG/ML  SOLN COMPARISON:  08/03/2018 FINDINGS: Cardiovascular: There is considerable extrinsic mass effect on the superior vena cava by the mediastinal mass, lowering the cross-sectional area to about 0.55 cm^2 on image 32/2, with some irregularity along the caval margin which could conceivably represent invasion of the cava or a small amount of marginal thrombus. No large acute pulmonary embolus although today's exam was not timed or performed to assess  specifically for pulmonary embolus. There is narrowing of the left upper lobe pulmonary artery due to the mass. Mediastinum/Nodes: Confluent and irregular mediastinal mass also extending into the left hilum and questionably involving the periphery of the left lung/pleural margin. A representative measurement on image 48/2 measures 10.3 by 6.0 cm. The mass extends around much of the aortic arch and portions the branch vasculature and abuts the left pulmonary artery. Extension in the anterior mediastinum has mass effect on the SVC is noted above. Both internal mammary arteries course through this tumor. The tumor involves the AP window. Left infrahilar node 1.2 cm in short axis on image 61/2. Pathologic supraclavicular adenopathy including a 1.2 cm node on image 9/2. A lower neck level IV lymph node measures 1.4 cm in short axis on image 1/2. Right supraclavicular node 1.0 cm in short axis, image 9/2. Pathologic left axillary and subpectoral adenopathy, index axillary node 2.2 cm in short axis on image 18/2. An upper mediastinal node below the thyroid gland measures 1.9 cm in short axis on image 16/2. Lungs/Pleura: Tumor extends around the left upper lobe tracheobronchial tree centrally. A lung nodule in the left upper lobe medially measures 1.0 by 1.1 cm on image 38/4. Tumor tracks along the pleural space below the left first rib as shown on image 22/2, measuring about 1.7 cm anterior-posterior. Upper Abdomen: Nodular heterogeneous enhancement in the spleen favoring tumor involvement. An index lesion measures 2.5 by 1.9 cm on image 122/2, but multiple splenic nodules are present. There is likely a lymph node in the splenic hilum on image 133/2, the bottom most image. Porta hepatis adenopathy identified including a 1.3 cm peripancreatic node on image 132/2 and a portacaval node measuring 1.5 cm in short axis on image 133/2. A lymph node between the right hemidiaphragmatic crus and the IVC measures 1.2 cm in short axis  also on image 132. Hypodense lesion medially in the right hepatic lobe on image 127/2 measures 1.6 by 1.3 cm. Questionable hypodense lesion peripherally in the right hepatic lobe about 0.9 cm in diameter on image 116/2. Musculoskeletal: Permeative destruction of the sternal manubrium, with tumor and/or hematoma along the anterior and posterior manubrial margins. The tumor rind anterior to the manubrium measures up to about 1.4 cm in thickness on image 97/6. IMPRESSION: 1. Large primarily anterior mediastinal mass extending to the left hilum, with notable mediastinal, left axillary and subpectoral, supraclavicular,  and porta hepatis/upper abdominal adenopathy. In addition there are multiple nodular lesions in the spleen and possibly in the liver, as well as permeative destruction of the sternal manubrium with tumor rind anterior and posterior to the sternum. 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 Left-sided small cell lung cancer might give a similar appearance. Aggressive germ-cell tumor or thymic carcinoma or less likely differential diagnostic considerations. Tissue diagnosis recommended. 2. The anterior mediastinal mass is causing narrowing of the SVC, and mild invasion of the SVC with slight marginal thrombosis is not readily excluded. No large pulmonary embolus is identified. Electronically Signed   By: Van Clines M.D.   On: 08/04/2018 01:22   Ct Abdomen Pelvis W Contrast  Result Date: 08/05/2018 CLINICAL DATA:  Staging initial workup of Hodgkin's lymphoma EXAM: CT ABDOMEN AND PELVIS WITH CONTRAST TECHNIQUE: Multidetector CT imaging of the abdomen and pelvis was performed using the standard protocol following bolus administration of intravenous contrast. CONTRAST:  11m OMNIPAQUE IOHEXOL 300 MG/ML SOLN, 348mISOVUE-300 IOPAMIDOL (ISOVUE-300) INJECTION 61% COMPARISON:  08/04/1018 FINDINGS: Lower chest: Small left pleural effusion. A lymph node in the right pericardial  adipose tissue measures 0.7 cm in short axis on image 7/2 Hepatobiliary: Indistinct hypodense lesion in the lateral segment left hepatic lobe 0.9 by 0.7 cm on image 16/2. Indistinct hypodense lesion posteriorly in the right hepatic lobe, 1.0 by 0.9 cm on image 23/2. Indistinct hypodense lesion in the right hepatic lobe measuring 1.5 by 1.1 cm on image 18/2. Indistinct hypodense lesion in the right hepatic lobe measuring 1.21.2 cm on image 15/2. These hypodense lesions are not sharply defined and are not characteristic of cysts. Mildly contracted gallbladder. Pancreas: Unremarkable Spleen: Multiple hypodense solid appearing lesions are present in the spleen and suspicious for malignancy. An index lesion laterally in the spleen measures 1.3 by 1.6 cm on image 14/2. At least 15 lesions are present in the spleen. Adrenals/Urinary Tract: Unremarkable Stomach/Bowel: Unremarkable Vascular/Lymphatic: Porta hepatis, portacaval, retroperitoneal, right common iliac, and right external iliac adenopathy noted. Index porta hepatis/peripancreatic node 1.3 cm in short axis on image 21/2. Index aortocaval node 1.2 cm in short axis on image 37/2. Index right common iliac lymph node 1.6 cm in short axis, image 3/2. Reproductive: 7.6 by 4.9 by 4.4 cm primarily fatty mass of the left ovary with some calcification and fluid density elements is compatible with a dermoid cyst. Other: No supplemental non-categorized findings. Musculoskeletal: Unremarkable IMPRESSION: 1. Mild-to-moderate pathologic abdominal and pelvic adenopathy as detailed above. 2. Abnormal hypodense solid-appearing lesions in the liver and spleen, suspicious for malignant involvement. 3. 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 4. Small left pleural effusion. Electronically Signed   By: WaVan Clines.D.   On: 08/05/2018 19:46   Nm Pet Image Initial (pi) Skull Base To Thigh  Result Date: 08/13/2018 CLINICAL DATA:  Initial treatment  strategy for Hodgkin's lymphoma. EXAM: NUCLEAR MEDICINE PET SKULL BASE TO THIGH TECHNIQUE: 8.0 mCi F-18 FDG was injected intravenously. Full-ring PET imaging was performed from the skull base to thigh after the radiotracer. CT data was obtained and used for attenuation correction and anatomic localization. Fasting blood glucose: 118 mg/dl COMPARISON:  None. FINDINGS: Mediastinal blood pool activity: SUV max 3.06 Liver activity: SUV max 4.06. NECK: No hypermetabolic lymph nodes in the neck. Incidental CT findings: none CHEST: Multiple hypermetabolic left supraclavicular and left axillary lymph nodes. -index left supraclavicular lymph node has an SUV max of 5.04, Deauville criteria  4. -index left axillary lymph node measures 1.4 cm and has an SUV max of 9.09, Deauville criteria 5 -index hypermetabolic mass within the anterior mediastinum measures 5.6 cm and has an SUV max of 8.65, Deauville criteria 5 -index lesion within the left hilum measures 3.4 cm and has an SUV max of 11.95, Deauville criteria 5. No hypermetabolic pulmonary nodules. Incidental CT findings: none ABDOMEN/PELVIS: Several areas of abnormal uptake identified within the abdomen and pelvis. -index lesion within the caudate lobe of liver measures approximately 1.5 cm and has an SUV max of 6.31, Deauville criteria 4. -index lesion within the lateral aspect of the left mid spleen measures approximately 1.5 cm within SUV max of 5.5, Deauville criteria 4. -Mild right iliac adenopathy identified. Index right common iliac node measures 1.1 cm and has an SUV max of 7.34, Deauville criteria 5. Normal appearance of the adrenal glands. Pancreas unremarkable. No hypermetabolic abdominal adenopathy. Incidental CT findings: Large dermoid within the left posterior pelvis measures 8.3 cm. SKELETON: Mild diffuse increased radiotracer uptake is identified throughout the bone marrow. Multifocal areas of moderate to marked increased uptake are also noted. -index lesion  with soft tissue component involving the manubrium measures 4.2 cm within SUV max of 17.4, Deauville criteria 5. There are associated mixed lytic and sclerotic changes involving the manubrium. -index lesion within the right sacrum measures 3 cm and has an SUV max of 10.48, Deauville criteria 5. Incidental CT findings: none IMPRESSION: 1. Large hypermetabolic mass within the anterior mediastinum identified compatible with lymphoma, Deauville criteria 5. There is extensive hypermetabolic left supraclavicular, left axillary, left hilar and right iliac adenopathy, Deauville criteria 4 and 5. Additional lesions are identified within the caudate lobe of liver and spleen, Deauville criteria 4. Multifocal hypermetabolic bone lesions are also identified, Deauville criteria 5. Electronically Signed   By: Kerby Moors M.D.   On: 08/13/2018 16:45   Ct Biopsy  Result Date: 08/06/2018 INDICATION: 37 year old female with new diagnosis of Hodgkin's lymphoma. She presents for CT-guided bone marrow biopsy to complete staging. EXAM: CT GUIDED BONE MARROW ASPIRATION AND CORE BIOPSY Interventional Radiologist:  Criselda Peaches, MD MEDICATIONS: None. ANESTHESIA/SEDATION: Moderate (conscious) sedation was employed during this procedure. A total of 2 milligrams versed and 100 micrograms fentanyl were administered intravenously. The patient's level of consciousness and vital signs were monitored continuously by radiology nursing throughout the procedure under my direct supervision. Total monitored sedation time: 11 minutes FLUOROSCOPY TIME:  None COMPLICATIONS: None immediate. Estimated blood loss: <25 mL PROCEDURE: Informed written consent was obtained from the patient after a thorough discussion of the procedural risks, benefits and alternatives. All questions were addressed. Maximal Sterile Barrier Technique was utilized including caps, mask, sterile gowns, sterile gloves, sterile drape, hand hygiene and skin antiseptic. A  timeout was performed prior to the initiation of the procedure. The patient was positioned prone and non-contrast localization CT was performed of the pelvis to demonstrate the iliac marrow spaces. Maximal barrier sterile technique utilized including caps, mask, sterile gowns, sterile gloves, large sterile drape, hand hygiene, and betadine prep. Under sterile conditions and local anesthesia, an 11 gauge coaxial bone biopsy needle was advanced into the right iliac marrow space. Needle position was confirmed with CT imaging. Initially, bone marrow aspiration was performed. Next, the 11 gauge outer cannula was utilized to obtain a right iliac bone marrow core biopsy. Needle was removed. Hemostasis was obtained with compression. The patient tolerated the procedure well. Samples were prepared with the cytotechnologist. IMPRESSION: Technically successful CT-guided  bone marrow biopsy and aspiration. Electronically Signed   By: Jacqulynn Cadet M.D.   On: 08/06/2018 12:56   Ct Bone Marrow Biopsy & Aspiration  Result Date: 08/06/2018 INDICATION: 37 year old female with new diagnosis of Hodgkin's lymphoma. She presents for CT-guided bone marrow biopsy to complete staging. EXAM: CT GUIDED BONE MARROW ASPIRATION AND CORE BIOPSY Interventional Radiologist:  Criselda Peaches, MD MEDICATIONS: None. ANESTHESIA/SEDATION: Moderate (conscious) sedation was employed during this procedure. A total of 2 milligrams versed and 100 micrograms fentanyl were administered intravenously. The patient's level of consciousness and vital signs were monitored continuously by radiology nursing throughout the procedure under my direct supervision. Total monitored sedation time: 11 minutes FLUOROSCOPY TIME:  None COMPLICATIONS: None immediate. Estimated blood loss: <25 mL PROCEDURE: Informed written consent was obtained from the patient after a thorough discussion of the procedural risks, benefits and alternatives. All questions were  addressed. Maximal Sterile Barrier Technique was utilized including caps, mask, sterile gowns, sterile gloves, sterile drape, hand hygiene and skin antiseptic. A timeout was performed prior to the initiation of the procedure. The patient was positioned prone and non-contrast localization CT was performed of the pelvis to demonstrate the iliac marrow spaces. Maximal barrier sterile technique utilized including caps, mask, sterile gowns, sterile gloves, large sterile drape, hand hygiene, and betadine prep. Under sterile conditions and local anesthesia, an 11 gauge coaxial bone biopsy needle was advanced into the right iliac marrow space. Needle position was confirmed with CT imaging. Initially, bone marrow aspiration was performed. Next, the 11 gauge outer cannula was utilized to obtain a right iliac bone marrow core biopsy. Needle was removed. Hemostasis was obtained with compression. The patient tolerated the procedure well. Samples were prepared with the cytotechnologist. IMPRESSION: Technically successful CT-guided bone marrow biopsy and aspiration. Electronically Signed   By: Jacqulynn Cadet M.D.   On: 08/06/2018 12:56   [place] Core Biopsy (lymph Nodes)  Result Date: 08/04/2018 INDICATION: No known primary, now with anterior mediastinal mass and left supraclavicular lymphadenopathy worrisome for lymphoma. Please perform ultrasound-guided supraclavicular lymph node biopsy for tissue diagnostic purposes. EXAM: ULTRASOUND-GUIDED LEFT SUPRACLAVICULAR LYMPH NODE BIOPSY COMPARISON:  Chest CT-earlier same day MEDICATIONS: None ANESTHESIA/SEDATION: Moderate (conscious) sedation was employed during this procedure. A total of Versed 2 mg and Fentanyl 100 mcg was administered intravenously. Moderate Sedation Time: 10 minutes. The patient's level of consciousness and vital signs were monitored continuously by radiology nursing throughout the procedure under my direct supervision. COMPLICATIONS: None immediate. TECHNIQUE:  Informed written consent was obtained from the patient after a discussion of the risks, benefits and alternatives to treatment. Questions regarding the procedure were encouraged and answered. Initial ultrasound scanning demonstrated multiple pathologically enlarged left cervical lymph nodes. Dominant nodal conglomeration within the left supraclavicular fossa measuring approximately 1.3 x 1.5 cm was targeted for biopsy given lymph node location and sonographic window (image 14). An ultrasound image was saved for documentation purposes. The procedure was planned. A timeout was performed prior to the initiation of the procedure. The operative was prepped and draped in the usual sterile fashion, and a sterile drape was applied covering the operative field. A timeout was performed prior to the initiation of the procedure. Local anesthesia was provided with 1% lidocaine with epinephrine. Under direct ultrasound guidance, an 18 gauge core needle device was utilized to obtain to obtain 6 core needle biopsies of the dominant left supraclavicular nodal conglomeration. The samples were placed in saline and submitted to pathology. The needle was removed and hemostasis was achieved with  manual compression. Post procedure scan was negative for significant hematoma. A dressing was placed. The patient tolerated the procedure well without immediate postprocedural complication. IMPRESSION: Technically successful ultrasound guided biopsy of dominant left supraclavicular nodal conglomeration. Electronically Signed   By: Sandi Mariscal M.D.   On: 08/04/2018 16:50    ASSESSMENT & PLAN:   37 y.o. 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 LDH - no significantly elevated. Sed rate elevated at 62 Discussed supraclavicular LN bx with pathology - Prelim  -consistent with Hodgkins lymphoma. Stage IVB with liver involvement. With cytopenias concern for BM involvement with Hodgkins lymphoma  08/05/18 ECHO revealed LV EF of 65-70%. No pericardial effusion.   2. 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 SVC compression due to anterior mediastinal mass - no upper extremity , neck or face swelling or change in visiion to suggest overt SVC syndrome  4. Microcytic anemia - normal iron. Likely anemia or chronic disease  5. B12 deficiency ? Pernicious anemia  PLAN:  -The pt does not prefer any fertility preservation strategies- declined lupron. -Pt began C1 A-AVD outpatient on Monday 08/09/18 -Recommended that the pt avoid bending over as this has caused her to feel dizzy and could be from mass compressing her veins -Began preventative low dose Xarelto 57m po daily at discharge for DVT prophylaxis in the setting of SVC narrowing. -Vitamin B12 replacement   -Discussed pt labwork today, 08/16/18; HGB has normalized to 13.0, WBC are normal, PLT are normal, blood chemistries are stable  -Discussed the 08/13/18 PET/CT which revealed Large hypermetabolic mass within the anterior mediastinum identified compatible with lymphoma, Deauville criteria 5. There is extensive hypermetabolic left supraclavicular, left axillary, left hilar and right iliac adenopathy, Deauville criteria 4 and 5. Additional lesions are identified within the caudate lobe of liver and spleen, Deauville criteria 4. 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 measuring 8.3cm within the left posterior pelvis  -Discussed the 08/06/18 BM Bx which revealed involvement by Hodgkin's lymphoma -Discussed the recommendation to receive 6 cycles of A-AVD treatment given her Stage IV disease,  and again noted the curative intent of treatment  -Will remove midline and will set pt up for short lines before treatment  -Will repeat scan after 2 cycles, and evaluate for resolution of SVC for possible port placement -Recommended salt and baking soda mouthwashes 4-5 times per day -The pt has no prohibitive toxicities from continuing C1 A-AVD with G-CSF support at this time.     -Please schedule C2D15 A-AVD with uElvina Sidleas per orders on 10/28 with labs and MD visit -IR for  PICC placement in 2-3 days for chemotherapy (Waiting on PICC/port due to significant SVC compression)   All of the patients questions were answered with apparent satisfaction. The patient knows to call the clinic with any problems, questions or concerns.  The total time spent in the appt was 2385mutes and more than 50% was on counseling and direct patient cares.     GaSullivan LoneD MSSnyderAHIVMS SCMemorial Hospital MiramarTPremier Specialty Hospital Of El Pasoematology/Oncology Physician CoEndoscopy Center Of [facility](Office):       [phone] cell):  [phone]):           [phone]/21/2019  2:36 PM  I, Schuyler Bain, am acting as a scribe for Dr. Irene Limbo  .I have reviewed the above documentation for accuracy and completeness, and I agree with the above. Brunetta Genera MD

## 2018-08-16 ENCOUNTER — Encounter: Payer: Self-pay | Admitting: Hematology

## 2018-08-16 ENCOUNTER — Other Ambulatory Visit: Payer: Self-pay | Admitting: *Deleted

## 2018-08-16 ENCOUNTER — Inpatient Hospital Stay: Payer: Medicaid Other

## 2018-08-16 ENCOUNTER — Ambulatory Visit: Payer: Self-pay

## 2018-08-16 ENCOUNTER — Inpatient Hospital Stay (HOSPITAL_BASED_OUTPATIENT_CLINIC_OR_DEPARTMENT_OTHER): Payer: Medicaid Other | Admitting: Hematology

## 2018-08-16 ENCOUNTER — Telehealth: Payer: Self-pay | Admitting: Hematology

## 2018-08-16 VITALS — BP 108/72 | HR 96 | Temp 98.3°F | Resp 17 | Ht 67.0 in | Wt 158.1 lb

## 2018-08-16 DIAGNOSIS — Z7901 Long term (current) use of anticoagulants: Secondary | ICD-10-CM

## 2018-08-16 DIAGNOSIS — C8198 Hodgkin lymphoma, unspecified, lymph nodes of multiple sites: Secondary | ICD-10-CM

## 2018-08-16 DIAGNOSIS — K769 Liver disease, unspecified: Secondary | ICD-10-CM

## 2018-08-16 DIAGNOSIS — D649 Anemia, unspecified: Secondary | ICD-10-CM

## 2018-08-16 DIAGNOSIS — E538 Deficiency of other specified B group vitamins: Secondary | ICD-10-CM

## 2018-08-16 DIAGNOSIS — I871 Compression of vein: Secondary | ICD-10-CM

## 2018-08-16 LAB — CMP (CANCER CENTER ONLY)
ALK PHOS: 104 U/L (ref 38–126)
ALT: 66 U/L — ABNORMAL HIGH (ref 0–44)
AST: 24 U/L (ref 15–41)
Albumin: 3.3 g/dL — ABNORMAL LOW (ref 3.5–5.0)
Anion gap: 9 (ref 5–15)
BILIRUBIN TOTAL: 0.2 mg/dL — AB (ref 0.3–1.2)
BUN: 18 mg/dL (ref 6–20)
CALCIUM: 9.5 mg/dL (ref 8.9–10.3)
CO2: 30 mmol/L (ref 22–32)
Chloride: 98 mmol/L (ref 98–111)
Creatinine: 0.77 mg/dL (ref 0.44–1.00)
GFR, Est AFR Am: >60 mL/min (ref >60)
Glucose, Bld: 110 mg/dL — ABNORMAL HIGH (ref 70–99)
POTASSIUM: 3.7 mmol/L (ref 3.5–5.1)
Sodium: 137 mmol/L (ref 135–145)
TOTAL PROTEIN: 7.1 g/dL (ref 6.5–8.1)

## 2018-08-16 LAB — CBC WITH DIFFERENTIAL (CANCER CENTER ONLY)
Band Neutrophils: 1 %
Basophils Absolute: 0 10*3/uL (ref 0.0–0.1)
Basophils Relative: 0 %
EOS ABS: 0.2 10*3/uL (ref 0.0–0.5)
EOS PCT: 3 %
HCT: 43.4 % (ref 36.0–46.0)
Hemoglobin: 13 g/dL (ref 12.0–15.0)
Lymphocytes Relative: 12 %
Lymphs Abs: 0.7 10*3/uL (ref 0.7–4.0)
MCH: 24.4 pg — AB (ref 26.0–34.0)
MCHC: 30 g/dL (ref 30.0–36.0)
MCV: 81.4 fL (ref 80.0–100.0)
MONO ABS: 0.6 10*3/uL (ref 0.1–1.0)
MONOS PCT: 10 %
NEUTROS ABS: 4.5 10*3/uL (ref 1.7–17.7)
Neutrophils Relative %: 74 %
PLATELETS: 360 10*3/uL (ref 150–400)
RBC: 5.33 MIL/uL — AB (ref 3.87–5.11)
RDW: 19.3 % — AB (ref 11.5–15.5)
WBC: 6 10*3/uL (ref 4.0–10.5)
nRBC: 0 % (ref 0.0–0.2)

## 2018-08-16 NOTE — Telephone Encounter (Signed)
Appt for PICC scheduled and patient notified per 10/21 verbal order from Carolanne Grumbling

## 2018-08-17 ENCOUNTER — Ambulatory Visit: Payer: Self-pay

## 2018-08-17 ENCOUNTER — Telehealth: Payer: Self-pay

## 2018-08-17 NOTE — Telephone Encounter (Signed)
Left a clear detailed msg for the upcoming appointments that was scheduled. Per 10/21 los. Mailed a letter with a calender enclosed

## 2018-08-18 ENCOUNTER — Encounter (HOSPITAL_COMMUNITY): Payer: Self-pay | Admitting: Hematology

## 2018-08-18 ENCOUNTER — Encounter: Payer: Self-pay | Admitting: Pharmacy Technician

## 2018-08-18 ENCOUNTER — Other Ambulatory Visit: Payer: Self-pay | Admitting: Hematology

## 2018-08-18 ENCOUNTER — Telehealth: Payer: Self-pay

## 2018-08-18 ENCOUNTER — Ambulatory Visit (HOSPITAL_COMMUNITY)
Admission: RE | Admit: 2018-08-18 | Discharge: 2018-08-18 | Disposition: A | Discharge status: Home / Self Care | Payer: Medicaid Other | Source: Ambulatory Visit | Attending: Hematology | Admitting: Hematology

## 2018-08-18 ENCOUNTER — Other Ambulatory Visit: Payer: Self-pay

## 2018-08-18 ENCOUNTER — Ambulatory Visit: Payer: Self-pay

## 2018-08-18 DIAGNOSIS — D739 Disease of spleen, unspecified: Secondary | ICD-10-CM | POA: Insufficient documentation

## 2018-08-18 DIAGNOSIS — C8198 Hodgkin lymphoma, unspecified, lymph nodes of multiple sites: Secondary | ICD-10-CM

## 2018-08-18 DIAGNOSIS — D509 Iron deficiency anemia, unspecified: Secondary | ICD-10-CM | POA: Insufficient documentation

## 2018-08-18 DIAGNOSIS — Z79899 Other long term (current) drug therapy: Secondary | ICD-10-CM | POA: Diagnosis not present

## 2018-08-18 DIAGNOSIS — K769 Liver disease, unspecified: Secondary | ICD-10-CM | POA: Insufficient documentation

## 2018-08-18 DIAGNOSIS — Z7901 Long term (current) use of anticoagulants: Secondary | ICD-10-CM | POA: Insufficient documentation

## 2018-08-18 DIAGNOSIS — Z880 Allergy status to penicillin: Secondary | ICD-10-CM | POA: Insufficient documentation

## 2018-08-18 DIAGNOSIS — Z452 Encounter for adjustment and management of vascular access device: Secondary | ICD-10-CM

## 2018-08-18 DIAGNOSIS — I871 Compression of vein: Secondary | ICD-10-CM | POA: Diagnosis not present

## 2018-08-18 HISTORY — PX: PICC LINE INSERTION: CATH118290

## 2018-08-18 MED ORDER — LIDOCAINE HCL 1 % IJ SOLN
INTRAMUSCULAR | Status: AC
  Filled 2018-08-18: qty 20

## 2018-08-18 MED ORDER — LIDOCAINE HCL 1 % IJ SOLN
INTRAMUSCULAR | Status: AC | PRN
  Administered 2018-08-18: 3 mL via INTRADERMAL

## 2018-08-18 NOTE — Progress Notes (Signed)
The patient is approved for drug assistance by [facility] [employer] for Adcetris. Enrollment is effective until 11/18/18 and is based on self pay. Drug replacement will be begin DOS 08/05/18.

## 2018-08-18 NOTE — Telephone Encounter (Signed)
PICC placed today. Per Dr. Irene Limbo, pt in need of PICC flush twice weekly. Treatment days can count as one. Saline and heparin flush orders placed under sign and held. Scheduling msg sent to have flush appt x 2 per week for the next three weeks. Noted that treatment day can count as one. Pt will need PICC dressing changed at appt on 08/23/18. Appt note edited to reflect that.

## 2018-08-19 ENCOUNTER — Ambulatory Visit: Payer: Self-pay

## 2018-08-20 ENCOUNTER — Ambulatory Visit: Payer: Self-pay

## 2018-08-23 ENCOUNTER — Encounter: Payer: Self-pay | Admitting: Nutrition

## 2018-08-23 ENCOUNTER — Telehealth: Payer: Self-pay

## 2018-08-23 ENCOUNTER — Inpatient Hospital Stay: Payer: Medicaid Other

## 2018-08-23 ENCOUNTER — Encounter: Payer: Self-pay | Admitting: Hematology

## 2018-08-23 ENCOUNTER — Inpatient Hospital Stay (HOSPITAL_BASED_OUTPATIENT_CLINIC_OR_DEPARTMENT_OTHER): Payer: Medicaid Other | Admitting: Hematology

## 2018-08-23 ENCOUNTER — Ambulatory Visit: Payer: Self-pay

## 2018-08-23 VITALS — BP 117/73 | HR 91 | Temp 98.3°F | Resp 18 | Ht 67.0 in | Wt 160.7 lb

## 2018-08-23 DIAGNOSIS — C8198 Hodgkin lymphoma, unspecified, lymph nodes of multiple sites: Secondary | ICD-10-CM

## 2018-08-23 DIAGNOSIS — Z452 Encounter for adjustment and management of vascular access device: Secondary | ICD-10-CM

## 2018-08-23 DIAGNOSIS — Z95828 Presence of other vascular implants and grafts: Secondary | ICD-10-CM

## 2018-08-23 DIAGNOSIS — E538 Deficiency of other specified B group vitamins: Secondary | ICD-10-CM

## 2018-08-23 DIAGNOSIS — I871 Compression of vein: Secondary | ICD-10-CM

## 2018-08-23 DIAGNOSIS — Z7189 Other specified counseling: Secondary | ICD-10-CM

## 2018-08-23 LAB — CBC WITH DIFFERENTIAL/PLATELET
Abs Immature Granulocytes: 0.85 10*3/uL — ABNORMAL HIGH (ref 0.00–0.07)
Basophils Absolute: 0 10*3/uL (ref 0.0–0.1)
Basophils Relative: 0 %
EOS ABS: 0.1 10*3/uL (ref 0.0–0.5)
EOS PCT: 1 %
HEMATOCRIT: 36 % (ref 36.0–46.0)
HEMOGLOBIN: 11.1 g/dL — AB (ref 12.0–15.0)
Immature Granulocytes: 6 %
LYMPHS ABS: 1.2 10*3/uL (ref 0.7–4.0)
LYMPHS PCT: 9 %
MCH: 25.1 pg — ABNORMAL LOW (ref 26.0–34.0)
MCHC: 30.8 g/dL (ref 30.0–36.0)
MCV: 81.3 fL (ref 80.0–100.0)
MONOS PCT: 5 %
Monocytes Absolute: 0.6 10*3/uL (ref 0.1–1.0)
Neutro Abs: 11 10*3/uL — ABNORMAL HIGH (ref 1.7–7.7)
Neutrophils Relative %: 79 %
Platelets: 109 10*3/uL — ABNORMAL LOW (ref 150–400)
RBC: 4.43 MIL/uL (ref 3.87–5.11)
RDW: 19.3 % — AB (ref 11.5–15.5)
WBC: 13.8 10*3/uL — ABNORMAL HIGH (ref 4.0–10.5)
nRBC: 0 % (ref 0.0–0.2)

## 2018-08-23 LAB — CMP (CANCER CENTER ONLY)
ALT: 90 U/L — ABNORMAL HIGH (ref 0–44)
AST: 33 U/L (ref 15–41)
Albumin: 3.1 g/dL — ABNORMAL LOW (ref 3.5–5.0)
Alkaline Phosphatase: 121 U/L (ref 38–126)
Anion gap: 9 (ref 5–15)
BUN: 11 mg/dL (ref 6–20)
CO2: 25 mmol/L (ref 22–32)
Calcium: 8.8 mg/dL — ABNORMAL LOW (ref 8.9–10.3)
Chloride: 107 mmol/L (ref 98–111)
Creatinine: 0.74 mg/dL (ref 0.44–1.00)
GFR, Est AFR Am: >60 mL/min (ref >60)
GFR, Estimated: >60 mL/min (ref >60)
Glucose, Bld: 127 mg/dL — ABNORMAL HIGH (ref 70–99)
Potassium: 4 mmol/L (ref 3.5–5.1)
Sodium: 141 mmol/L (ref 135–145)
Total Bilirubin: <0.2 mg/dL — ABNORMAL LOW (ref 0.3–1.2)
Total Protein: 6.7 g/dL (ref 6.5–8.1)

## 2018-08-23 MED ORDER — SODIUM CHLORIDE 0.9 % IV SOLN
Freq: Once | INTRAVENOUS | Status: AC
  Administered 2018-08-23: 11:00:00 via INTRAVENOUS
  Filled 2018-08-23: qty 250

## 2018-08-23 MED ORDER — ACETAMINOPHEN 325 MG PO TABS
ORAL_TABLET | ORAL | Status: AC
  Filled 2018-08-23: qty 2

## 2018-08-23 MED ORDER — ACETAMINOPHEN 325 MG PO TABS
650.0000 mg | ORAL_TABLET | Freq: Once | ORAL | Status: AC
  Administered 2018-08-23: 650 mg via ORAL

## 2018-08-23 MED ORDER — DOXORUBICIN HCL CHEMO IV INJECTION 2 MG/ML
25.0000 mg/m2 | Freq: Once | INTRAVENOUS | Status: AC
  Administered 2018-08-23: 46 mg via INTRAVENOUS
  Filled 2018-08-23: qty 23

## 2018-08-23 MED ORDER — SODIUM CHLORIDE 0.9 % IV SOLN
375.0000 mg/m2 | Freq: Once | INTRAVENOUS | Status: AC
  Administered 2018-08-23: 690 mg via INTRAVENOUS
  Filled 2018-08-23: qty 69

## 2018-08-23 MED ORDER — HEPARIN SOD (PORK) LOCK FLUSH 100 UNIT/ML IV SOLN
250.0000 [IU] | Freq: Once | INTRAVENOUS | Status: AC | PRN
  Administered 2018-08-23: 250 [IU]
  Filled 2018-08-23: qty 5

## 2018-08-23 MED ORDER — PALONOSETRON HCL INJECTION 0.25 MG/5ML
0.2500 mg | Freq: Once | INTRAVENOUS | Status: AC
  Administered 2018-08-23: 0.25 mg via INTRAVENOUS

## 2018-08-23 MED ORDER — HEPARIN SOD (PORK) LOCK FLUSH 100 UNIT/ML IV SOLN
500.0000 [IU] | Freq: Once | INTRAVENOUS | Status: AC | PRN
  Administered 2018-08-23: 500 [IU]
  Filled 2018-08-23: qty 5

## 2018-08-23 MED ORDER — VINBLASTINE SULFATE CHEMO INJECTION 1 MG/ML
6.0000 mg/m2 | Freq: Once | INTRAVENOUS | Status: AC
  Administered 2018-08-23: 11 mg via INTRAVENOUS
  Filled 2018-08-23: qty 11

## 2018-08-23 MED ORDER — SODIUM CHLORIDE 0.9% FLUSH
10.0000 mL | INTRAVENOUS | Status: DC | PRN
  Administered 2018-08-23: 10 mL
  Filled 2018-08-23: qty 10

## 2018-08-23 MED ORDER — DIPHENHYDRAMINE HCL 25 MG PO TABS
25.0000 mg | ORAL_TABLET | Freq: Once | ORAL | Status: AC
  Administered 2018-08-23: 25 mg via ORAL
  Filled 2018-08-23: qty 1

## 2018-08-23 MED ORDER — PALONOSETRON HCL INJECTION 0.25 MG/5ML
INTRAVENOUS | Status: AC
  Filled 2018-08-23: qty 5

## 2018-08-23 MED ORDER — DIPHENHYDRAMINE HCL 25 MG PO CAPS
ORAL_CAPSULE | ORAL | Status: AC
  Filled 2018-08-23: qty 1

## 2018-08-23 MED ORDER — SODIUM CHLORIDE 0.9 % IV SOLN
1.2000 mg/kg | Freq: Once | INTRAVENOUS | Status: AC
  Administered 2018-08-23: 85 mg via INTRAVENOUS
  Filled 2018-08-23: qty 17

## 2018-08-23 MED ORDER — SODIUM CHLORIDE 0.9 % IV SOLN
Freq: Once | INTRAVENOUS | Status: AC
  Administered 2018-08-23: 12:00:00 via INTRAVENOUS
  Filled 2018-08-23: qty 5

## 2018-08-23 MED ORDER — HYDROCODONE-ACETAMINOPHEN 5-325 MG PO TABS: 1.0000 | ORAL_TABLET | Freq: Four times a day (QID) | ORAL | 0 refills | Status: DC | PRN

## 2018-08-23 MED FILL — HYDROCODON-APAP 5-325: 5-325 | 3 days supply | Qty: 30 | Fill #0

## 2018-08-23 NOTE — Progress Notes (Signed)
Provided ensure enlive samples to patient at MD request.

## 2018-08-23 NOTE — Progress Notes (Signed)
HEMATOLOGY/ONCOLOGY CONSULTATION NOTE  Date of Service: 08/23/2018  Patient Care Team: Default, Provider, MD as PCP - General  CHIEF COMPLAINTS/PURPOSE OF CONSULTATION:  F/u for Mx of newly diagnosed Hodgkins lymphoma  HISTORY OF PRESENTING ILLNESS:   Cassandra Clark is a wonderful 37 y.o. female who has been referred to [place] by Dr. 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   Patient has a mild anemia and presented to the emergency room with new onset fevers chills and a 2-week history of worsening night sweats dry cough fatigue and anorexia.  Patient notes that she initially started feeling unwell from late June when she had noted increasing fatigue that required her to rest for longer periods of time.  She also noted that she at times felt lightheaded and dizzy when she bent forward.  She notes a decreased appetite.  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 She notes that she has not been able to do much of anything else other than rest at home and work to some degree at her job.  No chest pain no palpitations no diaphoresis no lower extremity edema.  No abdominal pain no nausea no vomiting no diarrhea.  No rectal bleeding. Notes that her periods have been regular.  In the emergency room the patient was noted to have a fever of 102.4 F and tachycardia related to this.  Blood counts showed that the patient was anemic with a hemoglobin around 8 with normal platelets and a WBC count of 3.6k.  Magnesium 1.9 and phosphorus of 2. She was also noted to be vitamin B12 deficient.  Most recent lab results (08/04/18) of CBC w/diff and CMP is as follows: all values are WNL except for WBC at 3.0k RBC at 3.64, HGB at 8.3, HCT at 82.7, MCV at 78.8, MCH at 22.8, MCHC at 28.9, RDW at 18.1, PLT at 459k, Lymphs abs at 300,  Calcium at 8.0, Albumin at 2.7. 08/04/18 LDH at 203 08/04/18 Vitamin B12 at 135  X-ray of the chest done on 08/03/2018 showed Right paratracheal and left AP window masses likely representing lymphadenopathy. Consider CT chest for further characterization.  CT of the chest with contrast was subsequently done which showed 1. 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 In addition there are multiple nodular lesions in the spleen and possibly in the liver, as well as permeative destruction of the sternal manubrium with tumor rind anterior and posterior to the sternum. 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 Left-sided small cell lung cancer might give a similar appearance. Aggressive germ-cell tumor or thymic carcinoma or less likely differential diagnostic considerations. Tissue diagnosis recommended. 2. The anterior mediastinal mass is causing narrowing of the SVC, and mild invasion of the SVC with slight marginal thrombosis is not readily excluded. No large pulmonary embolus is identified.  Patient had additional lab testing which showed a near normal LDH was 203. Sedimentation rate was done subsequently and was noted to be elevated.  Urine pregnancy test was within normal limits.  Patient was noted to have significant headaches over the last 4 to 6 weeks and had a CT of the head without contrast in the ED on 07/04/2018 which showed no  acute intracranial normalities.  On review of systems, pt reports fevers, unquantified weight loss, shortness of breath, night sweats and denies overt chest pain, rashes.   Interval History:   [name] returns today for management, evaluation and C1D15 of her A-AVD treatment of her Hodgkin's Lymphoma. The patient's last visit with [place] was on 08/16/18. She is accompanied today by her mother. The  pt reports that she is doing well overall.   The pt reports that she did have bad bone pains, mostly in the back, for two days, and took Tylenol and Claritin without any relief. She notes that this bone pain presented a couple days after her Udenycha injection.   The pt notes that she feels a little sleepy after she takes Xarelto and is taking this at night, and denies any concerns with bleeding.   The pt notes that she is not having dizziness when she bends forward any longer. She notes that she felt some mild tingling in her right hand a couple days ago but denies any persisting neuropathy.   Lab results today (08/23/18) of CBC w/diff, CMP is as follows: all values are WNL except for WBC at 13.8k, HGB at 11.1, MCH at 25.1, RDW at 19.3, PLT at 109k, ANC at 11.0k, Abs immature granulocytes at 0.85, Glucose at 127, Calcium at 8.8, Albumin at 3.1, ALT at 90, Total Bilirubin at <0.2.  On review of systems, pt reports stable energy levels, recent bone pains, mild isolated tingling in right hand, some hot flashes, and denies breathing difficulty, chest pain, dizziness, persisting neuropathy, abdominal pains, leg swelling, and any other symptoms.   MEDICAL HISTORY:  Past Medical History:  Diagnosis Date   Anemia     SURGICAL HISTORY: Past Surgical History:  Procedure Laterality Date   PICC LINE INSERTION Right 08/18/2018    SOCIAL HISTORY: Social History   Socioeconomic History   Marital status: Legally Separated    Spouse name: Not on file   Number of children: Not on file   Years of education: Not on file   Highest education level: Not on file  Occupational History   Not on file  Social Needs   Financial resource strain: Not on file   Food insecurity:    Worry: Not on file    Inability: Not on file   Transportation needs:    Medical: Not on file    Non-medical: Not on file  Tobacco Use   Smoking status: Never Smoker   Smokeless tobacco: Never Used  Substance and  Sexual Activity   Alcohol use: No   Drug use: No    Comment: Drinks tea   Sexual activity: Not on file  Lifestyle   Physical activity:    Days per week: Not on file    Minutes per session: Not on file   Stress: Not on file  Relationships   Social connections:    Talks on phone: Not on file    Gets together: Not on file    Attends religious service: Not on file    Active member of club or organization: Not on file    Attends meetings of clubs or organizations: Not on file    Relationship status: Not on file   Intimate partner violence:    Fear of current or ex partner: Not on file    Emotionally abused: Not on file    Physically abused: Not on file    Forced sexual activity: Not on file  Other Topics Concern  Not on file  Social History Narrative   Not on file    FAMILY HISTORY: Family History  Problem Relation Age of Onset   Diabetes Mellitus II Father    Hypertension Father    Congestive Heart Failure Maternal Grandmother    Diabetes Mellitus II Maternal Grandmother    Diabetes Mellitus II Maternal Uncle    Congestive Heart Failure Maternal Uncle     ALLERGIES:  is allergic to penicillins.  MEDICATIONS:  Current Outpatient Medications  Medication Sig Dispense Refill   B Complex-C (B-COMPLEX WITH VITAMIN C) tablet Take 1 tablet by mouth daily. 30 tablet 0   Cyanocobalamin (B-12) 1000 MCG SUBL Place 2,000 mcg under the tongue daily. 60 each 0   dexamethasone (DECADRON) 4 MG tablet Take 2 tablets by mouth once a day on the day after chemotherapy and then take 2 tablets two times a day for 2 days. Take with food. 30 tablet 1   ferrous sulfate 325 (65 FE) MG tablet Take 1 tablet (325 mg total) by mouth daily. (Patient not taking: Reported on 08/23/2018) 14 tablet 0   HYDROcodone-acetaminophen (NORCO) 5-325 MG tablet Take 1-2 tablets by mouth every 6 (six) hours as needed for moderate pain or severe pain. 30 tablet 0   lidocaine-prilocaine (EMLA)  cream Apply to affected area once 30 g 3   loratadine (CLARITIN) 10 MG tablet Take 10 mg by mouth daily.     LORazepam (ATIVAN) 0.5 MG tablet Take 1 tablet (0.5 mg total) by mouth every 6 (six) hours as needed (Nausea or vomiting). 30 tablet 0   ondansetron (ZOFRAN) 8 MG tablet Take 1 tablet (8 mg total) by mouth 2 (two) times daily as needed. Start on the third day after chemotherapy. 30 tablet 1   prochlorperazine (COMPAZINE) 10 MG tablet Take 1 tablet (10 mg total) by mouth every 6 (six) hours as needed (Nausea or vomiting). 30 tablet 1   rivaroxaban (XARELTO) 10 MG TABS tablet Take 1 tablet (10 mg total) by mouth daily. 30 tablet 1   No current facility-administered medications for this visit.     REVIEW OF SYSTEMS:    A 10+ POINT REVIEW OF SYSTEMS WAS OBTAINED including neurology, dermatology, psychiatry, cardiac, respiratory, lymph, extremities, GI, GU, Musculoskeletal, constitutional, breasts, reproductive, HEENT.  All pertinent positives are noted in the HPI.  All others are negative.   PHYSICAL EXAMINATION: ECOG PERFORMANCE STATUS: 1 - Symptomatic but completely ambulatory  . Vitals:   08/23/18 1004  BP: 117/73  Pulse: 91  Resp: 18  Temp: 98.3 F (36.8 C)  SpO2: 100%   Filed Weights   08/23/18 1004  Weight: 160 lb 11.2 oz (72.9 kg)   .Body mass index is 25.17 kg/m.  GENERAL:alert, in no acute distress and comfortable SKIN: no acute rashes, no significant lesions EYES: conjunctiva are pink and non-injected, sclera anicteric OROPHARYNX: MMM, no exudates, no oropharyngeal erythema or ulceration NECK: supple, no JVD LYMPH:  B/l supraclavicular and left axillary lymphadenopathy palpable  LUNGS: clear to auscultation b/l with normal respiratory effort HEART: regular rate & rhythm ABDOMEN:  normoactive bowel sounds , non tender, not distended. No palpable hepatosplenomegaly.  Extremity: trace pedal edema PSYCH: alert & oriented x 3 with fluent speech NEURO: no  focal motor/sensory deficits   LABORATORY DATA:  I have reviewed the data as listed  . CBC Latest Ref Rng & Units 08/23/2018 08/16/2018 08/09/2018  WBC 4.0 - 10.5 K/uL 13.8(H) 6.0 9.6  Hemoglobin 12.0 - 15.0 g/dL 11.1(L)  13.0 12.4  Hematocrit 36.0 - 46.0 % 36.0 43.4 40.1  Platelets 150 - 400 K/uL 109(L) 360 536(H)    . CMP Latest Ref Rng & Units 08/23/2018 08/16/2018 08/09/2018  Glucose 70 - 99 mg/dL 127(H) 110(H) 107(H)  BUN 6 - 20 mg/dL 11 18 20   Creatinine 0.44 - 1.00 mg/dL 0.74 0.77 0.69  Sodium 135 - 145 mmol/L 141 137 142  Potassium 3.5 - 5.1 mmol/L 4.0 3.7 4.1  Chloride 98 - 111 mmol/L 107 98 107  CO2 22 - 32 mmol/L 25 30 25   Calcium 8.9 - 10.3 mg/dL 8.8(L) 9.5 9.3  Total Protein 6.5 - 8.1 g/dL 6.7 7.1 7.9  Total Bilirubin 0.3 - 1.2 mg/dL <0.2(L) 0.2(L) 0.3  Alkaline Phos 38 - 126 U/L 121 104 68  AST 15 - 41 U/L 33 24 41  ALT 0 - 44 U/L 90(H) 66(H) 28   Component     Latest Ref Rng & Units 08/04/2018  Total Protein ELP     6.0 - 8.5 g/dL 6.2  Albumin ELP     2.9 - 4.4 g/dL 2.6 (L)  Alpha-1-Globulin     0.0 - 0.4 g/dL 0.3  Alpha-2-Globulin     0.4 - 1.0 g/dL 0.9  Beta Globulin     0.7 - 1.3 g/dL 1.0  Gamma Globulin     0.4 - 1.8 g/dL 1.4  M-SPIKE, %     Not Observed g/dL Not Observed  SPE Interp.      Comment  Comment      Comment  Globulin, Total     2.2 - 3.9 g/dL 3.6  A/G Ratio     0.7 - 1.7 0.7  Iron     28 - 170 ug/dL 23 (L)  TIBC     250 - 450 ug/dL 196 (L)  Saturation Ratios     10.4 - 31.8 % 12  UIBC     ug/dL 173  Retic Ct Pct     0.4 - 3.1 % 2.0  RBC.     3.87 - 5.11 MIL/uL 3.42 (L)  Retic Count, Absolute     19.0 - 186.0 K/uL 69.8  Immature Retic Fract     2.3 - 15.9 % 29.4 (H)  Hepatitis B Surface Ag     Negative Negative  HCV Ab     0.0 - 0.9 s/co ratio 0.1  Hep A Ab, IgM     Negative Negative  Hep B Core Ab, IgM     Negative Negative  Vitamin B12     180 - 914 pg/mL 135 (L)  Folate     >5.9 ng/mL 11.5  Ferritin      11 - 307 ng/mL 91  Uric Acid, Serum     2.5 - 7.1 mg/dL 4.5  LDH     98 - 192 U/L 203 (H)  AFP, Serum, Tumor Marker     0.0 - 8.3 ng/mL 3.4  CEA     0.0 - 4.7 ng/mL 1.4  Beta-2 Microglobulin     0.6 - 2.4 mg/L 2.4  Magnesium     1.7 - 2.4 mg/dL 2.0  Phosphorus     2.5 - 4.6 mg/dL 2.7    08/04/18 Left Cervical Core Biopsy:   08/06/18 BM Bx:    RADIOGRAPHIC STUDIES: I have personally reviewed the radiological images as listed and agreed with the findings in the report. Dg Chest 2 View  Result Date: 08/03/2018 CLINICAL DATA:  Fever with cough  for 1 week. Nausea and vomiting. EXAM: CHEST - 2 VIEW COMPARISON:  None. FINDINGS: Normal heart size and pulmonary vascularity. Right paratracheal and left AP window mediastinal masses likely representing prominent lymphadenopathy. Consider lymphoma. CT chest suggested for further characterization. Lungs are clear and expanded. No blunting of costophrenic angles. No pneumothorax. IMPRESSION: Right paratracheal and left AP window masses likely representing lymphadenopathy. Consider CT chest for further characterization. Electronically Signed   By: Lucienne Capers M.D.   On: 08/03/2018 23:21   Ct Chest W Contrast  Result Date: 08/04/2018 CLINICAL DATA:  Mediastinal mass on chest radiography, for further characterization. EXAM: CT CHEST WITH CONTRAST TECHNIQUE: Multidetector CT imaging of the chest was performed during intravenous contrast administration. CONTRAST:  11m OMNIPAQUE IOHEXOL 300 MG/ML  SOLN COMPARISON:  08/03/2018 FINDINGS: Cardiovascular: There is considerable extrinsic mass effect on the superior vena cava by the mediastinal mass, lowering the cross-sectional area to about 0.55 cm^2 on image 32/2, with some irregularity along the caval margin which could conceivably represent invasion of the cava or a small amount of marginal thrombus. No large acute pulmonary embolus although today's exam was not timed or performed to assess  specifically for pulmonary embolus. There is narrowing of the left upper lobe pulmonary artery due to the mass. Mediastinum/Nodes: Confluent and irregular mediastinal mass also extending into the left hilum and questionably involving the periphery of the left lung/pleural margin. A representative measurement on image 48/2 measures 10.3 by 6.0 cm. The mass extends around much of the aortic arch and portions the branch vasculature and abuts the left pulmonary artery. Extension in the anterior mediastinum has mass effect on the SVC is noted above. Both internal mammary arteries course through this tumor. The tumor involves the AP window. Left infrahilar node 1.2 cm in short axis on image 61/2. Pathologic supraclavicular adenopathy including a 1.2 cm node on image 9/2. A lower neck level IV lymph node measures 1.4 cm in short axis on image 1/2. Right supraclavicular node 1.0 cm in short axis, image 9/2. Pathologic left axillary and subpectoral adenopathy, index axillary node 2.2 cm in short axis on image 18/2. An upper mediastinal node below the thyroid gland measures 1.9 cm in short axis on image 16/2. Lungs/Pleura: Tumor extends around the left upper lobe tracheobronchial tree centrally. A lung nodule in the left upper lobe medially measures 1.0 by 1.1 cm on image 38/4. Tumor tracks along the pleural space below the left first rib as shown on image 22/2, measuring about 1.7 cm anterior-posterior. Upper Abdomen: Nodular heterogeneous enhancement in the spleen favoring tumor involvement. An index lesion measures 2.5 by 1.9 cm on image 122/2, but multiple splenic nodules are present. There is likely a lymph node in the splenic hilum on image 133/2, the bottom most image. Porta hepatis adenopathy identified including a 1.3 cm peripancreatic node on image 132/2 and a portacaval node measuring 1.5 cm in short axis on image 133/2. A lymph node between the right hemidiaphragmatic crus and the IVC measures 1.2 cm in short axis  also on image 132. Hypodense lesion medially in the right hepatic lobe on image 127/2 measures 1.6 by 1.3 cm. Questionable hypodense lesion peripherally in the right hepatic lobe about 0.9 cm in diameter on image 116/2. Musculoskeletal: Permeative destruction of the sternal manubrium, with tumor and/or hematoma along the anterior and posterior manubrial margins. The tumor rind anterior to the manubrium measures up to about 1.4 cm in thickness on image 97/6. IMPRESSION: 1. Large primarily anterior mediastinal mass  extending to the left hilum, with notable mediastinal, left axillary and subpectoral, supraclavicular, and porta hepatis/upper abdominal adenopathy. In addition there are multiple nodular lesions in the spleen and possibly in the liver, as well as permeative destruction of the sternal manubrium with tumor rind anterior and posterior to the sternum. 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 Left-sided small cell lung cancer might give a similar appearance. Aggressive germ-cell tumor or thymic carcinoma or less likely differential diagnostic considerations. Tissue diagnosis recommended. 2. The anterior mediastinal mass is causing narrowing of the SVC, and mild invasion of the SVC with slight marginal thrombosis is not readily excluded. No large pulmonary embolus is identified. Electronically Signed   By: Van Clines M.D.   On: 08/04/2018 01:22   Ct Abdomen Pelvis W Contrast  Result Date: 08/05/2018 CLINICAL DATA:  Staging initial workup of Hodgkin's lymphoma EXAM: CT ABDOMEN AND PELVIS WITH CONTRAST TECHNIQUE: Multidetector CT imaging of the abdomen and pelvis was performed using the standard protocol following bolus administration of intravenous contrast. CONTRAST:  16m OMNIPAQUE IOHEXOL 300 MG/ML SOLN, 320mISOVUE-300 IOPAMIDOL (ISOVUE-300) INJECTION 61% COMPARISON:  08/04/1018 FINDINGS: Lower chest: Small left pleural effusion. A lymph node in the right pericardial  adipose tissue measures 0.7 cm in short axis on image 7/2 Hepatobiliary: Indistinct hypodense lesion in the lateral segment left hepatic lobe 0.9 by 0.7 cm on image 16/2. Indistinct hypodense lesion posteriorly in the right hepatic lobe, 1.0 by 0.9 cm on image 23/2. Indistinct hypodense lesion in the right hepatic lobe measuring 1.5 by 1.1 cm on image 18/2. Indistinct hypodense lesion in the right hepatic lobe measuring 1.21.2 cm on image 15/2. These hypodense lesions are not sharply defined and are not characteristic of cysts. Mildly contracted gallbladder. Pancreas: Unremarkable Spleen: Multiple hypodense solid appearing lesions are present in the spleen and suspicious for malignancy. An index lesion laterally in the spleen measures 1.3 by 1.6 cm on image 14/2. At least 15 lesions are present in the spleen. Adrenals/Urinary Tract: Unremarkable Stomach/Bowel: Unremarkable Vascular/Lymphatic: Porta hepatis, portacaval, retroperitoneal, right common iliac, and right external iliac adenopathy noted. Index porta hepatis/peripancreatic node 1.3 cm in short axis on image 21/2. Index aortocaval node 1.2 cm in short axis on image 37/2. Index right common iliac lymph node 1.6 cm in short axis, image 3/2. Reproductive: 7.6 by 4.9 by 4.4 cm primarily fatty mass of the left ovary with some calcification and fluid density elements is compatible with a dermoid cyst. Other: No supplemental non-categorized findings. Musculoskeletal: Unremarkable IMPRESSION: 1. Mild-to-moderate pathologic abdominal and pelvic adenopathy as detailed above. 2. Abnormal hypodense solid-appearing lesions in the liver and spleen, suspicious for malignant involvement. 3. 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 4. Small left pleural effusion. Electronically Signed   By: WaVan Clines.D.   On: 08/05/2018 19:46   Nm Pet Image Initial (pi) Skull Base To Thigh  Result Date: 08/13/2018 CLINICAL DATA:  Initial treatment  strategy for Hodgkin's lymphoma. EXAM: NUCLEAR MEDICINE PET SKULL BASE TO THIGH TECHNIQUE: 8.0 mCi F-18 FDG was injected intravenously. Full-ring PET imaging was performed from the skull base to thigh after the radiotracer. CT data was obtained and used for attenuation correction and anatomic localization. Fasting blood glucose: 118 mg/dl COMPARISON:  None. FINDINGS: Mediastinal blood pool activity: SUV max 3.06 Liver activity: SUV max 4.06. NECK: No hypermetabolic lymph nodes in the neck. Incidental CT findings: none CHEST: Multiple hypermetabolic left supraclavicular and left axillary lymph nodes. -  index left supraclavicular lymph node has an SUV max of 5.04, Deauville criteria 4. -index left axillary lymph node measures 1.4 cm and has an SUV max of 9.09, Deauville criteria 5 -index hypermetabolic mass within the anterior mediastinum measures 5.6 cm and has an SUV max of 8.65, Deauville criteria 5 -index lesion within the left hilum measures 3.4 cm and has an SUV max of 11.95, Deauville criteria 5. No hypermetabolic pulmonary nodules. Incidental CT findings: none ABDOMEN/PELVIS: Several areas of abnormal uptake identified within the abdomen and pelvis. -index lesion within the caudate lobe of liver measures approximately 1.5 cm and has an SUV max of 6.31, Deauville criteria 4. -index lesion within the lateral aspect of the left mid spleen measures approximately 1.5 cm within SUV max of 5.5, Deauville criteria 4. -Mild right iliac adenopathy identified. Index right common iliac node measures 1.1 cm and has an SUV max of 7.34, Deauville criteria 5. Normal appearance of the adrenal glands. Pancreas unremarkable. No hypermetabolic abdominal adenopathy. Incidental CT findings: Large dermoid within the left posterior pelvis measures 8.3 cm. SKELETON: Mild diffuse increased radiotracer uptake is identified throughout the bone marrow. Multifocal areas of moderate to marked increased uptake are also noted. -index lesion  with soft tissue component involving the manubrium measures 4.2 cm within SUV max of 17.4, Deauville criteria 5. There are associated mixed lytic and sclerotic changes involving the manubrium. -index lesion within the right sacrum measures 3 cm and has an SUV max of 10.48, Deauville criteria 5. Incidental CT findings: none IMPRESSION: 1. Large hypermetabolic mass within the anterior mediastinum identified compatible with lymphoma, Deauville criteria 5. There is extensive hypermetabolic left supraclavicular, left axillary, left hilar and right iliac adenopathy, Deauville criteria 4 and 5. Additional lesions are identified within the caudate lobe of liver and spleen, Deauville criteria 4. Multifocal hypermetabolic bone lesions are also identified, Deauville criteria 5. Electronically Signed   By: Kerby Moors M.D.   On: 08/13/2018 16:45   Ct Biopsy  Result Date: 08/06/2018 INDICATION: 37 year old female with new diagnosis of Hodgkin's lymphoma. She presents for CT-guided bone marrow biopsy to complete staging. EXAM: CT GUIDED BONE MARROW ASPIRATION AND CORE BIOPSY Interventional Radiologist:  Criselda Peaches, MD MEDICATIONS: None. ANESTHESIA/SEDATION: Moderate (conscious) sedation was employed during this procedure. A total of 2 milligrams versed and 100 micrograms fentanyl were administered intravenously. The patient's level of consciousness and vital signs were monitored continuously by radiology nursing throughout the procedure under my direct supervision. Total monitored sedation time: 11 minutes FLUOROSCOPY TIME:  None COMPLICATIONS: None immediate. Estimated blood loss: <25 mL PROCEDURE: Informed written consent was obtained from the patient after a thorough discussion of the procedural risks, benefits and alternatives. All questions were addressed. Maximal Sterile Barrier Technique was utilized including caps, mask, sterile gowns, sterile gloves, sterile drape, hand hygiene and skin antiseptic. A  timeout was performed prior to the initiation of the procedure. The patient was positioned prone and non-contrast localization CT was performed of the pelvis to demonstrate the iliac marrow spaces. Maximal barrier sterile technique utilized including caps, mask, sterile gowns, sterile gloves, large sterile drape, hand hygiene, and betadine prep. Under sterile conditions and local anesthesia, an 11 gauge coaxial bone biopsy needle was advanced into the right iliac marrow space. Needle position was confirmed with CT imaging. Initially, bone marrow aspiration was performed. Next, the 11 gauge outer cannula was utilized to obtain a right iliac bone marrow core biopsy. Needle was removed. Hemostasis was obtained with compression. The patient tolerated  the procedure well. Samples were prepared with the cytotechnologist. IMPRESSION: Technically successful CT-guided bone marrow biopsy and aspiration. Electronically Signed   By: Jacqulynn Cadet M.D.   On: 08/06/2018 12:56   Ct Bone Marrow Biopsy & Aspiration  Result Date: 08/06/2018 INDICATION: 37 year old female with new diagnosis of Hodgkin's lymphoma. She presents for CT-guided bone marrow biopsy to complete staging. EXAM: CT GUIDED BONE MARROW ASPIRATION AND CORE BIOPSY Interventional Radiologist:  Criselda Peaches, MD MEDICATIONS: None. ANESTHESIA/SEDATION: Moderate (conscious) sedation was employed during this procedure. A total of 2 milligrams versed and 100 micrograms fentanyl were administered intravenously. The patient's level of consciousness and vital signs were monitored continuously by radiology nursing throughout the procedure under my direct supervision. Total monitored sedation time: 11 minutes FLUOROSCOPY TIME:  None COMPLICATIONS: None immediate. Estimated blood loss: <25 mL PROCEDURE: Informed written consent was obtained from the patient after a thorough discussion of the procedural risks, benefits and alternatives. All questions were  addressed. Maximal Sterile Barrier Technique was utilized including caps, mask, sterile gowns, sterile gloves, sterile drape, hand hygiene and skin antiseptic. A timeout was performed prior to the initiation of the procedure. The patient was positioned prone and non-contrast localization CT was performed of the pelvis to demonstrate the iliac marrow spaces. Maximal barrier sterile technique utilized including caps, mask, sterile gowns, sterile gloves, large sterile drape, hand hygiene, and betadine prep. Under sterile conditions and local anesthesia, an 11 gauge coaxial bone biopsy needle was advanced into the right iliac marrow space. Needle position was confirmed with CT imaging. Initially, bone marrow aspiration was performed. Next, the 11 gauge outer cannula was utilized to obtain a right iliac bone marrow core biopsy. Needle was removed. Hemostasis was obtained with compression. The patient tolerated the procedure well. Samples were prepared with the cytotechnologist. IMPRESSION: Technically successful CT-guided bone marrow biopsy and aspiration. Electronically Signed   By: Jacqulynn Cadet M.D.   On: 08/06/2018 12:56   [place] Core Biopsy (lymph Nodes)  Result Date: 08/04/2018 INDICATION: No known primary, now with anterior mediastinal mass and left supraclavicular lymphadenopathy worrisome for lymphoma. Please perform ultrasound-guided supraclavicular lymph node biopsy for tissue diagnostic purposes. EXAM: ULTRASOUND-GUIDED LEFT SUPRACLAVICULAR LYMPH NODE BIOPSY COMPARISON:  Chest CT-earlier same day MEDICATIONS: None ANESTHESIA/SEDATION: Moderate (conscious) sedation was employed during this procedure. A total of Versed 2 mg and Fentanyl 100 mcg was administered intravenously. Moderate Sedation Time: 10 minutes. The patient's level of consciousness and vital signs were monitored continuously by radiology nursing throughout the procedure under my direct supervision. COMPLICATIONS: None immediate. TECHNIQUE:  Informed written consent was obtained from the patient after a discussion of the risks, benefits and alternatives to treatment. Questions regarding the procedure were encouraged and answered. Initial ultrasound scanning demonstrated multiple pathologically enlarged left cervical lymph nodes. Dominant nodal conglomeration within the left supraclavicular fossa measuring approximately 1.3 x 1.5 cm was targeted for biopsy given lymph node location and sonographic window (image 14). An ultrasound image was saved for documentation purposes. The procedure was planned. A timeout was performed prior to the initiation of the procedure. The operative was prepped and draped in the usual sterile fashion, and a sterile drape was applied covering the operative field. A timeout was performed prior to the initiation of the procedure. Local anesthesia was provided with 1% lidocaine with epinephrine. Under direct ultrasound guidance, an 18 gauge core needle device was utilized to obtain to obtain 6 core needle biopsies of the dominant left supraclavicular nodal conglomeration. The samples were placed in saline  and submitted to pathology. The needle was removed and hemostasis was achieved with manual compression. Post procedure scan was negative for significant hematoma. A dressing was placed. The patient tolerated the procedure well without immediate postprocedural complication. IMPRESSION: Technically successful ultrasound guided biopsy of dominant left supraclavicular nodal conglomeration. Electronically Signed   By: Sandi Mariscal M.D.   On: 08/04/2018 16:50   Ir Picc Placement Right >5 Yrs Inc Img Guide  Result Date: 08/18/2018 INDICATION: Patient in need of venous access for chemotherapy administration. Has a short midline access in place. Request made for exchange for PICC as SVC compression improving after treatment. EXAM: ULTRASOUND AND FLUOROSCOPIC GUIDED PICC LINE INSERTION MEDICATIONS: 1 mL 1% lidocaine CONTRAST:  None  FLUOROSCOPY TIME:  30 seconds COMPLICATIONS: None immediate. TECHNIQUE: The procedure, risks, benefits, and alternatives were explained to the patient and informed written consent was obtained. A timeout was performed prior to the initiation of the procedure. The right upper extremity was prepped with chlorhexidine in a sterile fashion, and a sterile drape was applied covering the operative field. Maximum barrier sterile technique with sterile gowns and gloves were used for the procedure. A timeout was performed prior to the initiation of the procedure. Local anesthesia was provided with 1% lidocaine. A guidewire was advanced through the existing midline to the level of the superior caval-atrial junction for measurement purposes and the PICC line was cut to length. A peel-away sheath was placed and a 40 cm, 5 [place], dual lumen was inserted to level of the superior caval-atrial junction. A post procedure spot fluoroscopic was obtained. The catheter easily aspirated and flushed and was sutured in place. A dressing was placed. The patient tolerated the procedure well without immediate post procedural complication. FINDINGS: After catheter placement, the tip lies within the superior cavoatrial junction. The catheter aspirates and flushes normally and is ready for immediate use. IMPRESSION: Successful fluoroscopic guided placement of a right basilic vein approach, 40 cm, 5 French, dual lumen PICC with tip at the superior caval-atrial junction. The PICC line is ready for immediate use. Read by: Brynda Greathouse PA-C Electronically Signed   By: Aletta Edouard M.D.   On: 08/18/2018 12:28    ASSESSMENT & PLAN:   37 y.o. 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 LDH - no significantly elevated. Sed rate elevated at 62 Discussed  supraclavicular LN bx with pathology - Prelim -consistent with Hodgkins lymphoma. Stage IVB with liver involvement. With cytopenias concern for BM involvement with Hodgkins lymphoma  08/05/18 ECHO revealed LV EF of 65-70%. No pericardial effusion.   08/06/18 BM Bx revealed involvement by Hodgkin's lymphoma  08/13/18 PET/CT revealed Large hypermetabolic mass within the anterior mediastinum identified compatible with lymphoma, Deauville criteria 5. There is extensive hypermetabolic left supraclavicular, left axillary, left hilar and right iliac adenopathy, Deauville criteria 4 and 5. Additional lesions are identified within the caudate lobe of liver and spleen, Deauville criteria 4. Multifocal hypermetabolic bone lesions are also identified, Deauville criteria 5   2. 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 SVC compression due to anterior mediastinal mass - no upper extremity , neck or face swelling or change in visiion to suggest overt SVC syndrome  4. Microcytic anemia - normal iron. Likely anemia or chronic disease  5. B12 deficiency ? Pernicious anemia  PLAN:  -The pt does not prefer any  fertility preservation strategies- declined lupron. -Pt began C1 A-AVD outpatient on Monday 08/09/18 -Recommended that the pt avoid bending over as this has caused her to feel dizzy and could be from mass compressing her veins -Began preventative low dose Xarelto 34m po daily at discharge for DVT prophylaxis in the setting of SVC narrowing. 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 HGB mildly low at 11.1. PLT lower at 109k. ALT higher at 90. Will continue to monitor.  -The pt has no prohibitive toxicities from continuing C1 A-AVD with G-CSF support at this time.   -Hydrocodone for as needed use with Udenycha related bone pains -Pt did receive Picc line in the interim, will leave this in place during this cycle, and will add port next cycle. Flush 1-2 times each week.  -Continue with preventative dose of 185mXarelto  -Recommended that the pt continue to eat well, drink at least 48-64 oz of water each day, and walk 20-30 minutes each day.   Please schedule C2D1 as per orders with labs in 2 weeks with MD visit (Dr RuAudelia HivesPlease schedule C2D15 as per orders with labs in 4 weeks with MD visit (Dr KaIrene LimboPICC line flush twice weekly and with labs   All of the patients questions were answered with apparent satisfaction. The patient knows to call the clinic with any problems, questions or concerns.  The total time spent in the appt was 30 minutes and more than 50% was on counseling and direct patient cares.     GaSullivan LoneD MS AAHIVMS [facility]CL[facility] AscTMedstar Montgomery Medical Centerematology/Oncology Physician [facility](Office):       [phone] cell):  [phone]):           [phone]/28/2019 11:13 AM  I, ScBaldwin Jamaicaam acting as a scribe for Dr. KaIrene Limbo.I have reviewed the above documentation for accuracy and completeness, and I agree with the above. .GBrunetta GeneraD

## 2018-08-23 NOTE — Patient Instructions (Signed)
[facility] Discharge Instructions for Patients Receiving Chemotherapy  Today you received the following chemotherapy agents Doxirubicin, Dacarbazine, Vinblastine and Bleomycin  To help prevent nausea and vomiting after your treatment, we encourage you to take your nausea medication as prescribed.    If you develop nausea and vomiting that is not controlled by your nausea medication, call the clinic.   BELOW ARE SYMPTOMS THAT SHOULD BE REPORTED IMMEDIATELY:  *FEVER GREATER THAN 100.5 F  *CHILLS WITH OR WITHOUT FEVER  NAUSEA AND VOMITING THAT IS NOT CONTROLLED WITH YOUR NAUSEA MEDICATION  *UNUSUAL SHORTNESS OF BREATH  *UNUSUAL BRUISING OR BLEEDING  TENDERNESS IN MOUTH AND THROAT WITH OR WITHOUT PRESENCE OF ULCERS  *URINARY PROBLEMS  *BOWEL PROBLEMS  UNUSUAL RASH Items with * indicate a potential emergency and should be followed up as soon as possible.  Feel free to call the clinic should you have any questions or concerns. The clinic phone number is [phone].  Please show the CHEMO ALERT CARD at check-in to the Emergency Department and triage nurse.   

## 2018-08-23 NOTE — Progress Notes (Signed)
Per Dr. Irene Limbo - OK to treat today with ALT 90

## 2018-08-23 NOTE — Telephone Encounter (Signed)
Printed avs and calender of upcoming appointment. Per 10/28 los 

## 2018-08-24 ENCOUNTER — Telehealth: Payer: Self-pay

## 2018-08-24 ENCOUNTER — Ambulatory Visit: Payer: Self-pay

## 2018-08-24 ENCOUNTER — Inpatient Hospital Stay: Payer: Medicaid Other

## 2018-08-24 VITALS — BP 131/66 | HR 87 | Temp 98.0°F | Resp 18

## 2018-08-24 DIAGNOSIS — I871 Compression of vein: Secondary | ICD-10-CM

## 2018-08-24 DIAGNOSIS — C8198 Hodgkin lymphoma, unspecified, lymph nodes of multiple sites: Secondary | ICD-10-CM

## 2018-08-24 DIAGNOSIS — Z7189 Other specified counseling: Secondary | ICD-10-CM

## 2018-08-24 MED ORDER — PEGFILGRASTIM-CBQV 6 MG/0.6ML ~~LOC~~ SOSY
6.0000 mg | PREFILLED_SYRINGE | Freq: Once | SUBCUTANEOUS | Status: AC
  Administered 2018-08-24: 6 mg via SUBCUTANEOUS

## 2018-08-24 MED ORDER — PEGFILGRASTIM-CBQV 6 MG/0.6ML ~~LOC~~ SOSY
PREFILLED_SYRINGE | SUBCUTANEOUS | Status: AC
  Filled 2018-08-24: qty 0.6

## 2018-08-24 NOTE — Telephone Encounter (Signed)
Printed avs calender of upcoming appointments. Per 10/28 los. patient will pick up new calender today when she come in for her injection

## 2018-08-25 ENCOUNTER — Inpatient Hospital Stay: Payer: Medicaid Other

## 2018-08-25 ENCOUNTER — Ambulatory Visit: Payer: Self-pay

## 2018-08-25 DIAGNOSIS — Z95828 Presence of other vascular implants and grafts: Secondary | ICD-10-CM

## 2018-08-25 DIAGNOSIS — C8198 Hodgkin lymphoma, unspecified, lymph nodes of multiple sites: Secondary | ICD-10-CM

## 2018-08-25 LAB — CMP (CANCER CENTER ONLY)
ALT: 109 U/L — AB (ref 0–44)
AST: 49 U/L — ABNORMAL HIGH (ref 15–41)
Albumin: 3.2 g/dL — ABNORMAL LOW (ref 3.5–5.0)
Alkaline Phosphatase: 163 U/L — ABNORMAL HIGH (ref 38–126)
Anion gap: 8 (ref 5–15)
BUN: 17 mg/dL (ref 6–20)
CHLORIDE: 104 mmol/L (ref 98–111)
CO2: 26 mmol/L (ref 22–32)
CREATININE: 0.78 mg/dL (ref 0.44–1.00)
Calcium: 8.9 mg/dL (ref 8.9–10.3)
GFR, Est AFR Am: >60 mL/min (ref >60)
Glucose, Bld: 275 mg/dL — ABNORMAL HIGH (ref 70–99)
POTASSIUM: 3.9 mmol/L (ref 3.5–5.1)
Sodium: 138 mmol/L (ref 135–145)
Total Bilirubin: 0.3 mg/dL (ref 0.3–1.2)
Total Protein: 6.7 g/dL (ref 6.5–8.1)

## 2018-08-25 LAB — CBC WITH DIFFERENTIAL/PLATELET
Band Neutrophils: 15 %
Basophils Absolute: 0 10*3/uL (ref 0.0–0.1)
Basophils Relative: 0 %
EOS PCT: 0 %
Eosinophils Absolute: 0 10*3/uL (ref 0.0–0.5)
HCT: 32.3 % — ABNORMAL LOW (ref 36.0–46.0)
Hemoglobin: 10.3 g/dL — ABNORMAL LOW (ref 12.0–15.0)
LYMPHS ABS: 1.7 10*3/uL (ref 0.7–4.0)
Lymphocytes Relative: 2 %
MCH: 25.4 pg — ABNORMAL LOW (ref 26.0–34.0)
MCHC: 31.9 g/dL (ref 30.0–36.0)
MCV: 79.6 fL — AB (ref 80.0–100.0)
MONO ABS: 0 10*3/uL — AB (ref 0.1–1.0)
MONOS PCT: 0 %
NEUTROS PCT: 83 %
NRBC: 0 % (ref 0.0–0.2)
Neutro Abs: 83.1 10*3/uL — ABNORMAL HIGH (ref 1.7–17.7)
PLATELETS: 52 10*3/uL — AB (ref 150–400)
RBC: 4.06 MIL/uL (ref 3.87–5.11)
RDW: 18.9 % — AB (ref 11.5–15.5)
WBC: 84.8 10*3/uL (ref 4.0–10.5)

## 2018-08-25 MED ORDER — SODIUM CHLORIDE 0.9% FLUSH
10.0000 mL | INTRAVENOUS | Status: DC | PRN
  Administered 2018-08-25: 10 mL
  Filled 2018-08-25: qty 10

## 2018-08-25 MED ORDER — HEPARIN SOD (PORK) LOCK FLUSH 100 UNIT/ML IV SOLN
500.0000 [IU] | Freq: Once | INTRAVENOUS | Status: AC | PRN
  Administered 2018-08-25: 250 [IU]
  Filled 2018-08-25: qty 5

## 2018-08-27 ENCOUNTER — Other Ambulatory Visit: Payer: Self-pay | Admitting: *Deleted

## 2018-08-27 ENCOUNTER — Encounter: Payer: Self-pay | Admitting: Hematology

## 2018-08-27 DIAGNOSIS — C8198 Hodgkin lymphoma, unspecified, lymph nodes of multiple sites: Secondary | ICD-10-CM

## 2018-08-27 NOTE — Progress Notes (Signed)
Received approval from [employer] for Xarelto 08/26/18-08/27/19. Copy mailed to [facility] as well for her records.  Placed in HIM bin to be scanned.

## 2018-08-30 ENCOUNTER — Inpatient Hospital Stay: Payer: Medicaid Other

## 2018-08-30 ENCOUNTER — Inpatient Hospital Stay: Payer: Medicaid Other | Attending: Hematology

## 2018-08-30 DIAGNOSIS — C8198 Hodgkin lymphoma, unspecified, lymph nodes of multiple sites: Secondary | ICD-10-CM | POA: Diagnosis not present

## 2018-08-30 DIAGNOSIS — Z5111 Encounter for antineoplastic chemotherapy: Secondary | ICD-10-CM | POA: Insufficient documentation

## 2018-08-30 DIAGNOSIS — Z5189 Encounter for other specified aftercare: Secondary | ICD-10-CM | POA: Insufficient documentation

## 2018-08-30 DIAGNOSIS — Z95828 Presence of other vascular implants and grafts: Secondary | ICD-10-CM

## 2018-08-30 DIAGNOSIS — Z79899 Other long term (current) drug therapy: Secondary | ICD-10-CM | POA: Insufficient documentation

## 2018-08-30 DIAGNOSIS — Z5112 Encounter for antineoplastic immunotherapy: Secondary | ICD-10-CM | POA: Insufficient documentation

## 2018-08-30 DIAGNOSIS — Z23 Encounter for immunization: Secondary | ICD-10-CM | POA: Insufficient documentation

## 2018-08-30 DIAGNOSIS — Z7901 Long term (current) use of anticoagulants: Secondary | ICD-10-CM | POA: Diagnosis not present

## 2018-08-30 LAB — CBC WITH DIFFERENTIAL (CANCER CENTER ONLY)
BAND NEUTROPHILS: 4 %
BASOS PCT: 0 %
Basophils Absolute: 0 10*3/uL (ref 0.0–0.1)
EOS ABS: 0 10*3/uL (ref 0.0–0.5)
Eosinophils Relative: 2 %
HCT: 33.9 % — ABNORMAL LOW (ref 36.0–46.0)
Hemoglobin: 10.5 g/dL — ABNORMAL LOW (ref 12.0–15.0)
Lymphocytes Relative: 33 %
Lymphs Abs: 0.8 10*3/uL (ref 0.7–4.0)
MCH: 24.7 pg — ABNORMAL LOW (ref 26.0–34.0)
MCHC: 31 g/dL (ref 30.0–36.0)
MCV: 79.8 fL — AB (ref 80.0–100.0)
MONO ABS: 0.2 10*3/uL (ref 0.1–1.0)
Monocytes Relative: 10 %
NRBC: 0 % (ref 0.0–0.2)
Neutro Abs: 1.3 10*3/uL — ABNORMAL LOW (ref 1.7–17.7)
Neutrophils Relative %: 51 %
PLATELETS: 221 10*3/uL (ref 150–400)
RBC: 4.25 MIL/uL (ref 3.87–5.11)
RDW: 18.1 % — AB (ref 11.5–15.5)
WBC: 2.4 10*3/uL — AB (ref 4.0–10.5)

## 2018-08-30 LAB — CMP (CANCER CENTER ONLY)
ALT: 44 U/L (ref 0–44)
AST: 14 U/L — ABNORMAL LOW (ref 15–41)
Albumin: 3.3 g/dL — ABNORMAL LOW (ref 3.5–5.0)
Alkaline Phosphatase: 110 U/L (ref 38–126)
Anion gap: 8 (ref 5–15)
BILIRUBIN TOTAL: 0.3 mg/dL (ref 0.3–1.2)
BUN: 13 mg/dL (ref 6–20)
CHLORIDE: 102 mmol/L (ref 98–111)
CO2: 27 mmol/L (ref 22–32)
Calcium: 8.8 mg/dL — ABNORMAL LOW (ref 8.9–10.3)
Creatinine: 0.71 mg/dL (ref 0.44–1.00)
Glucose, Bld: 136 mg/dL — ABNORMAL HIGH (ref 70–99)
POTASSIUM: 3.6 mmol/L (ref 3.5–5.1)
Sodium: 137 mmol/L (ref 135–145)
TOTAL PROTEIN: 6.7 g/dL (ref 6.5–8.1)

## 2018-08-30 MED ORDER — HEPARIN SOD (PORK) LOCK FLUSH 100 UNIT/ML IV SOLN
500.0000 [IU] | Freq: Once | INTRAVENOUS | Status: AC | PRN
  Administered 2018-08-30: 250 [IU]
  Filled 2018-08-30: qty 5

## 2018-08-30 MED ORDER — SODIUM CHLORIDE 0.9% FLUSH
10.0000 mL | INTRAVENOUS | Status: DC | PRN
  Administered 2018-08-30: 10 mL
  Filled 2018-08-30: qty 10

## 2018-09-01 ENCOUNTER — Inpatient Hospital Stay: Payer: Medicaid Other

## 2018-09-01 DIAGNOSIS — Z5112 Encounter for antineoplastic immunotherapy: Secondary | ICD-10-CM | POA: Diagnosis not present

## 2018-09-01 DIAGNOSIS — Z95828 Presence of other vascular implants and grafts: Secondary | ICD-10-CM

## 2018-09-01 DIAGNOSIS — C8198 Hodgkin lymphoma, unspecified, lymph nodes of multiple sites: Secondary | ICD-10-CM

## 2018-09-01 LAB — CMP (CANCER CENTER ONLY)
ALBUMIN: 3.2 g/dL — AB (ref 3.5–5.0)
ALT: 40 U/L (ref 0–44)
AST: 21 U/L (ref 15–41)
Alkaline Phosphatase: 109 U/L (ref 38–126)
Anion gap: 9 (ref 5–15)
BUN: 15 mg/dL (ref 6–20)
CALCIUM: 9.4 mg/dL (ref 8.9–10.3)
CO2: 29 mmol/L (ref 22–32)
CREATININE: 0.79 mg/dL (ref 0.44–1.00)
Chloride: 104 mmol/L (ref 98–111)
GFR, Est AFR Am: >60 mL/min (ref >60)
GFR, Estimated: >60 mL/min (ref >60)
GLUCOSE: 155 mg/dL — AB (ref 70–99)
POTASSIUM: 3.6 mmol/L (ref 3.5–5.1)
SODIUM: 142 mmol/L (ref 135–145)
Total Bilirubin: <0.2 mg/dL — ABNORMAL LOW (ref 0.3–1.2)
Total Protein: 6.6 g/dL (ref 6.5–8.1)

## 2018-09-01 LAB — CBC WITH DIFFERENTIAL/PLATELET
ABS IMMATURE GRANULOCYTES: 2.58 10*3/uL — AB (ref 0.00–0.07)
BASOS ABS: 0 10*3/uL (ref 0.0–0.1)
Basophils Relative: 0 %
Eosinophils Absolute: 0 10*3/uL (ref 0.0–0.5)
Eosinophils Relative: 0 %
HCT: 33.4 % — ABNORMAL LOW (ref 36.0–46.0)
Hemoglobin: 10.5 g/dL — ABNORMAL LOW (ref 12.0–15.0)
Immature Granulocytes: 19 %
LYMPHS ABS: 1 10*3/uL (ref 0.7–4.0)
LYMPHS PCT: 8 %
MCH: 25.1 pg — ABNORMAL LOW (ref 26.0–34.0)
MCHC: 31.4 g/dL (ref 30.0–36.0)
MCV: 79.9 fL — ABNORMAL LOW (ref 80.0–100.0)
Monocytes Absolute: 1.5 10*3/uL — ABNORMAL HIGH (ref 0.1–1.0)
Monocytes Relative: 11 %
NEUTROS ABS: 8.5 10*3/uL — AB (ref 1.7–7.7)
NRBC: 0.3 % — AB (ref 0.0–0.2)
Neutrophils Relative %: 62 %
PLATELETS: 307 10*3/uL (ref 150–400)
RBC: 4.18 MIL/uL (ref 3.87–5.11)
RDW: 18.1 % — AB (ref 11.5–15.5)
WBC: 13.6 10*3/uL — AB (ref 4.0–10.5)

## 2018-09-01 MED ORDER — HEPARIN SOD (PORK) LOCK FLUSH 100 UNIT/ML IV SOLN
500.0000 [IU] | Freq: Once | INTRAVENOUS | Status: AC | PRN
  Administered 2018-09-01: 500 [IU]
  Filled 2018-09-01: qty 5

## 2018-09-01 MED ORDER — SODIUM CHLORIDE 0.9% FLUSH
10.0000 mL | INTRAVENOUS | Status: DC | PRN
  Administered 2018-09-01: 10 mL
  Filled 2018-09-01: qty 10

## 2018-09-05 ENCOUNTER — Other Ambulatory Visit: Payer: Self-pay | Admitting: Hematology and Oncology

## 2018-09-06 ENCOUNTER — Inpatient Hospital Stay (HOSPITAL_BASED_OUTPATIENT_CLINIC_OR_DEPARTMENT_OTHER): Payer: Medicaid Other | Admitting: Hematology and Oncology

## 2018-09-06 ENCOUNTER — Inpatient Hospital Stay: Payer: Medicaid Other

## 2018-09-06 ENCOUNTER — Encounter: Payer: Self-pay | Admitting: Hematology and Oncology

## 2018-09-06 VITALS — BP 107/72 | HR 102 | Temp 98.4°F | Resp 18 | Ht 67.0 in | Wt 161.4 lb

## 2018-09-06 VITALS — HR 102

## 2018-09-06 DIAGNOSIS — D649 Anemia, unspecified: Secondary | ICD-10-CM

## 2018-09-06 DIAGNOSIS — C8198 Hodgkin lymphoma, unspecified, lymph nodes of multiple sites: Secondary | ICD-10-CM

## 2018-09-06 DIAGNOSIS — Z7189 Other specified counseling: Secondary | ICD-10-CM

## 2018-09-06 DIAGNOSIS — Z79899 Other long term (current) drug therapy: Secondary | ICD-10-CM

## 2018-09-06 DIAGNOSIS — Z23 Encounter for immunization: Secondary | ICD-10-CM

## 2018-09-06 DIAGNOSIS — Z5111 Encounter for antineoplastic chemotherapy: Secondary | ICD-10-CM

## 2018-09-06 DIAGNOSIS — I871 Compression of vein: Secondary | ICD-10-CM

## 2018-09-06 DIAGNOSIS — Z95828 Presence of other vascular implants and grafts: Secondary | ICD-10-CM

## 2018-09-06 DIAGNOSIS — Z5112 Encounter for antineoplastic immunotherapy: Secondary | ICD-10-CM | POA: Diagnosis not present

## 2018-09-06 LAB — FERRITIN: Ferritin: 347 ng/mL — ABNORMAL HIGH (ref 11–307)

## 2018-09-06 LAB — CBC WITH DIFFERENTIAL/PLATELET
ABS IMMATURE GRANULOCYTES: 0.61 10*3/uL — AB (ref 0.00–0.07)
BASOS PCT: 0 %
Basophils Absolute: 0 10*3/uL (ref 0.0–0.1)
Eosinophils Absolute: 0 10*3/uL (ref 0.0–0.5)
Eosinophils Relative: 0 %
HCT: 31.6 % — ABNORMAL LOW (ref 36.0–46.0)
HEMOGLOBIN: 9.9 g/dL — AB (ref 12.0–15.0)
IMMATURE GRANULOCYTES: 5 %
LYMPHS PCT: 16 %
Lymphs Abs: 1.9 10*3/uL (ref 0.7–4.0)
MCH: 25.4 pg — ABNORMAL LOW (ref 26.0–34.0)
MCHC: 31.3 g/dL (ref 30.0–36.0)
MCV: 81.2 fL (ref 80.0–100.0)
MONOS PCT: 6 %
Monocytes Absolute: 0.7 10*3/uL (ref 0.1–1.0)
NEUTROS ABS: 9 10*3/uL — AB (ref 1.7–7.7)
NEUTROS PCT: 73 %
PLATELETS: 243 10*3/uL (ref 150–400)
RBC: 3.89 MIL/uL (ref 3.87–5.11)
RDW: 20.1 % — ABNORMAL HIGH (ref 11.5–15.5)
WBC: 12.3 10*3/uL — AB (ref 4.0–10.5)
nRBC: 0 % (ref 0.0–0.2)

## 2018-09-06 LAB — CMP (CANCER CENTER ONLY)
ALBUMIN: 3.3 g/dL — AB (ref 3.5–5.0)
ALT: 36 U/L (ref 0–44)
ANION GAP: 10 (ref 5–15)
AST: 23 U/L (ref 15–41)
Alkaline Phosphatase: 113 U/L (ref 38–126)
BUN: 12 mg/dL (ref 6–20)
CHLORIDE: 105 mmol/L (ref 98–111)
CO2: 26 mmol/L (ref 22–32)
Calcium: 9.3 mg/dL (ref 8.9–10.3)
Creatinine: 0.78 mg/dL (ref 0.44–1.00)
GFR, Est AFR Am: >60 mL/min (ref >60)
GFR, Estimated: >60 mL/min (ref >60)
GLUCOSE: 120 mg/dL — AB (ref 70–99)
POTASSIUM: 3.6 mmol/L (ref 3.5–5.1)
Sodium: 141 mmol/L (ref 135–145)
TOTAL PROTEIN: 7 g/dL (ref 6.5–8.1)

## 2018-09-06 LAB — IRON AND TIBC
Iron: 53 ug/dL (ref 41–142)
SATURATION RATIOS: 23 % (ref 21–57)
TIBC: 234 ug/dL — ABNORMAL LOW (ref 236–444)
UIBC: 180 ug/dL (ref 120–384)

## 2018-09-06 MED ORDER — ACETAMINOPHEN 325 MG PO TABS
650.0000 mg | ORAL_TABLET | Freq: Once | ORAL | Status: AC
  Administered 2018-09-06: 650 mg via ORAL

## 2018-09-06 MED ORDER — PALONOSETRON HCL INJECTION 0.25 MG/5ML
INTRAVENOUS | Status: AC
  Filled 2018-09-06: qty 5

## 2018-09-06 MED ORDER — INFLUENZA VAC SPLIT QUAD 0.5 ML IM SUSY
PREFILLED_SYRINGE | INTRAMUSCULAR | Status: AC
  Filled 2018-09-06: qty 0.5

## 2018-09-06 MED ORDER — SODIUM CHLORIDE 0.9% FLUSH
10.0000 mL | INTRAVENOUS | Status: DC | PRN
  Administered 2018-09-06: 10 mL
  Filled 2018-09-06: qty 10

## 2018-09-06 MED ORDER — SODIUM CHLORIDE 0.9 % IV SOLN
Freq: Once | INTRAVENOUS | Status: AC
  Administered 2018-09-06: 14:00:00 via INTRAVENOUS
  Filled 2018-09-06: qty 5

## 2018-09-06 MED ORDER — PALONOSETRON HCL INJECTION 0.25 MG/5ML
0.2500 mg | Freq: Once | INTRAVENOUS | Status: AC
  Administered 2018-09-06: 0.25 mg via INTRAVENOUS

## 2018-09-06 MED ORDER — DOXORUBICIN HCL CHEMO IV INJECTION 2 MG/ML
25.0000 mg/m2 | Freq: Once | INTRAVENOUS | Status: AC
  Administered 2018-09-06: 46 mg via INTRAVENOUS
  Filled 2018-09-06: qty 23

## 2018-09-06 MED ORDER — VINBLASTINE SULFATE CHEMO INJECTION 1 MG/ML
6.0000 mg/m2 | Freq: Once | INTRAVENOUS | Status: AC
  Administered 2018-09-06: 11 mg via INTRAVENOUS
  Filled 2018-09-06: qty 11

## 2018-09-06 MED ORDER — HEPARIN SOD (PORK) LOCK FLUSH 100 UNIT/ML IV SOLN
250.0000 [IU] | Freq: Once | INTRAVENOUS | Status: AC | PRN
  Administered 2018-09-06: 250 [IU]
  Filled 2018-09-06: qty 5

## 2018-09-06 MED ORDER — ACETAMINOPHEN 325 MG PO TABS
ORAL_TABLET | ORAL | Status: AC
  Filled 2018-09-06: qty 2

## 2018-09-06 MED ORDER — RIVAROXABAN 10 MG PO TABS: 10.0000 mg | ORAL_TABLET | Freq: Every day | ORAL | 1 refills | Status: DC

## 2018-09-06 MED ORDER — SODIUM CHLORIDE 0.9 % IV SOLN
Freq: Once | INTRAVENOUS | Status: AC
  Administered 2018-09-06: 14:00:00 via INTRAVENOUS
  Filled 2018-09-06: qty 250

## 2018-09-06 MED ORDER — DIPHENHYDRAMINE HCL 25 MG PO TABS
25.0000 mg | ORAL_TABLET | Freq: Once | ORAL | Status: AC
  Administered 2018-09-06: 25 mg via ORAL
  Filled 2018-09-06: qty 1

## 2018-09-06 MED ORDER — SODIUM CHLORIDE 0.9 % IV SOLN
1.2000 mg/kg | Freq: Once | INTRAVENOUS | Status: AC
  Administered 2018-09-06: 85 mg via INTRAVENOUS
  Filled 2018-09-06: qty 17

## 2018-09-06 MED ORDER — DIPHENHYDRAMINE HCL 25 MG PO CAPS
ORAL_CAPSULE | ORAL | Status: AC
  Filled 2018-09-06: qty 1

## 2018-09-06 MED ORDER — SODIUM CHLORIDE 0.9 % IV SOLN
375.0000 mg/m2 | Freq: Once | INTRAVENOUS | Status: AC
  Administered 2018-09-06: 690 mg via INTRAVENOUS
  Filled 2018-09-06: qty 69

## 2018-09-06 MED ORDER — INFLUENZA VAC SPLIT QUAD 0.5 ML IM SUSY
0.5000 mL | PREFILLED_SYRINGE | Freq: Once | INTRAMUSCULAR | Status: AC
  Administered 2018-09-06: 0.5 mL via INTRAMUSCULAR

## 2018-09-06 MED FILL — XARELTO 10 MG TABLET: 10 | 30 days supply | Qty: 30 | Fill #0

## 2018-09-06 NOTE — Progress Notes (Signed)
Per Dr Audelia Hives ok to tx today with 102 HR

## 2018-09-06 NOTE — Progress Notes (Signed)
Hematology/Oncology Outpatient Progress Note  Patient Name:  Cassandra Clark  DOB: 05-23-1981   Date of Service: September 06, 2018  Consulting Physician: Henreitta Leber, MD Hematology/Oncology   Reason for Visit: In the setting of stage IVB classical Hodgkin lymphoma with evidence of a large anterior mediastinal mass and bulky lymphadenopathy involving the supraclavicular and left axillary lymph node basins; with evidence of bone marrow involvement with Hodgkin lymphoma; having completed previously 1 cycle of brentuximab vedotin, doxorubicin, vinblastine, and dacarbazine, she presents now for laboratory studies and reevaluation prior to day 1 cycle 2 of treatment.  Brief History: Cassandra Clark is a 37 year old resident of [place] whose past medical history was relatively insignificant until she presented to the emergency department on August 03, 2018 with a 2-week history of drenching night sweats, dry cough, dizziness, and easy fatigability.  She is accompanied by her attentive sister Cassandra Clark. She was generally so weak and tired that she had to take off from work just to rest. She is a Automotive engineer.  She had no chest pains, palpitations, diaphoresis, or lower extremity edema.  There is no abdominal pain, nausea, vomiting, or diarrhea.  Her menses had been regular.  In the emergency department she had a recorded fever of 102.4 .  She was tachycardic.  Her initial laboratory studies revealed anemia with a white blood cell count 3.6.  She was also noted to be vitamin B12 deficient (August 04, 2018 vitamin B12: 135).  On chest x-ray there was a right paratracheal mass in the left AP window mass consistent with lymphadenopathy.  CT imaging of the chest with intravenous contrast showed a large anterior mediastinal mass extending into the left hilum and a left axillary and subpectoral, supraclavicular, and porta hepatis/upper abdominal lymphadenopathy.  There were multiple nodular lesions seen  in the spleen and liver as well as a permeative destructive lesion in the sternum with tumor rind anterior and posterior to the sternum. The results of the left cervical lymph node biopsy from August 04, 2018 revealed classic Hodgkin lymphoma; CD30 positive cells comprised at least 20% of the cellular infiltrate.  Those results are detailed below.  An FDG PET/CT scan was performed on August 04, 2018.  Those results are detailed below.  In short, they confirmed the presence of disease both above and below the diaphragm.  A bone marrow aspiration and biopsy revealed a normocellular bone marrow with atypical lymphohistiocytic infiltrates consistent with Hodgkin lymphoma. Those results are also detailed below. On October 14 she began treatment with brentuximab vedotin, doxorubicin, vinblastine, and dacarbazine.  Pretreatment 2D echocardiogram, PICC line placement in the right upper arm, and laboratory studies were performed under the direction of Dr. Irene Limbo. She is tolerated treatment without significant gastrointestinal or infectious complication.  Following her treatment, she receives pegfilgrastim: 6 mg subcutaneously in the setting of anticipated marked myelosuppression.  It is with this background she presents now for laboratory studies and reevaluation in anticipation of day 1, cycle 2 of brentuximab vedotin, doxorubicin, vinblastine, and dacarbazine as outlined above.  Interval History: In the interim since her last visit, her overall energy level has improved.  Her appetite has improved while her weight remains stable.  She has occasional damp sweats.  She has no rash or itching.  There is no fever, shaking chills, sweats, or flulike symptoms.  She denies visual changes or hearing deficit.  She has no headache, dizziness, lightheadedness, syncope, or near syncopal episodes.  She has no epistaxis or hemoptysis.  She reports  no pain or difficulty in swallowing.  She has no chest or abdominal pain.  She has no  dyspnea at rest.  She has chronic but stable exertional dyspnea unchanged.  There is no cough, sore throat, or orthopnea.  She denies heartburn or indigestion.  No nausea, vomiting, diarrhea, constipation is reported.  Her last menstruation began on October 26.  It lasted for 7 days and was heavier than usual. There is no urinary frequency, urgency, hematuria, dysuria identified. She has no melena or bright red blood per rectum.  She reports no swelling of her ankles. There is no numbness or tingling in the fingers or toes. There are no new focal areas of bone, joint, muscle pain.   Past Medical History Reviewed        Family History Reviewed       Social History Reviewed  Allergies  Allergen Reactions  . Penicillins Hives    Has patient had a PCN reaction causing immediate rash, facial/tongue/throat swelling, SOB or lightheadedness with hypotension: Unknown Has patient had a PCN reaction causing severe rash involving mucus membranes or skin necrosis: Unknown Has patient had a PCN reaction that required hospitalization: Unknown Has patient had a PCN reaction occurring within the last 10 years: Unknown If all of the above answers are "NO", then may proceed with Cephalosporin use.    Current Outpatient Medications on File Prior to Visit  Medication Sig  . B Complex-C (B-COMPLEX WITH VITAMIN C) tablet Take 1 tablet by mouth daily.  . Cyanocobalamin (B-12) 1000 MCG SUBL Place 2,000 mcg under the tongue daily.  Marland Kitchen HYDROcodone-acetaminophen (NORCO) 5-325 MG tablet Take 1-2 tablets by mouth every 6 (six) hours as needed for moderate pain or severe pain.  Marland Kitchen lidocaine-prilocaine (EMLA) cream Apply to affected area once  . loratadine (CLARITIN) 10 MG tablet Take 10 mg by mouth daily.  Marland Kitchen LORazepam (ATIVAN) 0.5 MG tablet Take 1 tablet (0.5 mg total) by mouth every 6 (six) hours as needed (Nausea or vomiting).  . ondansetron (ZOFRAN) 8 MG tablet Take 1 tablet (8 mg total) by mouth 2 (two) times daily as  needed. Start on the third day after chemotherapy.  . prochlorperazine (COMPAZINE) 10 MG tablet Take 1 tablet (10 mg total) by mouth every 6 (six) hours as needed (Nausea or vomiting).  Marland Kitchen dexamethasone (DECADRON) 4 MG tablet Take 2 tablets by mouth once a day on the day after chemotherapy and then take 2 tablets two times a day for 2 days. Take with food. (Patient not taking: Reported on 09/06/2018)  . ferrous sulfate 325 (65 FE) MG tablet Take 1 tablet (325 mg total) by mouth daily. (Patient not taking: Reported on 08/23/2018)   No current facility-administered medications on file prior to visit.     Review of Systems: Constitutional: No fever or shaking chills.  Occasional day of night sweats.  No appetite or weight deficit.  Energy level improved. Skin: No rash, scaling, sores, lumps, or jaundice. HEENT: No visual changes or hearing deficit. Pulmonary: No unusual cough, sore throat, or orthopnea. Breasts: No complaints. Cardiovascular: No coronary artery disease, angina, or myocardial infarction.  No cardiac dysrhythmia, essential hypertension, or dyslipidemia. Gastrointestinal: No indigestion, dysphagia, abdominal pain, diarrhea, or constipation.  No change in bowel habits. Genitourinary: No frequency, urgency, hematuria, or dysuria. Musculoskeletal: No arthralgias or myalgias; no joint swelling, pain, or instability. Hematologic: No bleeding tendency or easy bruisability; stage IV Hodgkin lymphoma. Endocrine: No intolerance to hot or cold; no thyroid disease or diabetes  mellitus. Vascular: No peripheral arterial or venous thromboembolic disease. Psychological: No anxiety, depression, or mood changes; no mental health illnesses. Neurological: No dizziness, lightheadedness, syncope, or near syncopal episodes; no numbness or tingling in the fingers or toes.  Physical Examination: Vital Signs: Body surface area is 1.86 meters squared.  Vitals:   09/06/18 1215  BP: 107/72  Pulse: (!) 102    Resp: 18  Temp: 98.4 F (36.9 C)  SpO2: 100%    Filed Weights   09/06/18 1215  Weight: 161 lb 6.4 oz (73.2 kg)  Body mass index is 25.28 kg/m. Constitutional: Cassandra Clark is a fully nourished and developed African-American. She looks age appropriate. She is friendly and cooperative without respiratory compromise at rest. Skin: No rashes, scaling, dryness, jaundice, or itching. HEENT: Head is normocephalic and atraumatic.  Pupils are equal round and reactive to light and accommodation.  Sclerae are anicteric.  Conjunctivae are pink.  No sinus tenderness nor oropharyngeal lesions.  Lips without cracking or peeling; tongue without mass, inflammation, or nodularity.  Mucous membranes are moist. Neck: Supple and symmetric.  No jugular venous distention or thyromegaly.  Trachea is midline. Lymphatics: No cervical or supraclavicular lymphadenopathy.  No epitrochlear, axillary, or inguinal lymphadenopathy is appreciated. Respiratory/chest: Thorax is symmetrical.  Breath sounds are clear to auscultation and percussion.  Normal excursion and respiratory effort. Back: Symmetric without deformity or tenderness. Cardiovascular: Heart rate and rhythm are regular without murmurs. Gastrointestinal: Abdomen is soft, nontender; no organomegaly.  Bowel sounds are normoactive.  No masses are appreciated. Extremities: In the lower extremities, there is no asymmetric swelling, erythema, tenderness, or cord formation.  No clubbing, cyanosis, nor edema. Hematologic: No petechiae, hematomas, or ecchymoses. Psychological: She is oriented to person, place, and time; normal affect, memory, and cognition. Neurological: There are no gross neurologic deficits.  Laboratory Results: September 06, 2018  Ref Range & Units 10:43 5d ago 7d ago 12d ago 2wk ago  WBC 4.0 - 10.5 K/uL 12.3High   13.6High   2.4Low   84.8High Panic  CM 13.8High    RBC 3.87 - 5.11 MIL/uL 3.89  4.18  4.25  4.06  4.43   Hemoglobin 12.0 - 15.0 g/dL  9.9Low   10.5Low   10.5Low   10.3Low   11.1Low    HCT 36.0 - 46.0 % 31.6Low   33.4Low   33.9Low   32.3Low   36.0   MCV 80.0 - 100.0 fL 81.2  79.9Low   79.8Low   79.6Low   81.3   MCH 26.0 - 34.0 pg 25.4Low   25.1Low   24.7Low   25.4Low   25.1Low    MCHC 30.0 - 36.0 g/dL 31.3  31.4  31.0  31.9  30.8   RDW 11.5 - 15.5 % 20.1High   18.1High   18.1High   18.9High   19.3High    Platelets 150 - 400 K/uL 243  307  221  52Low   109Low    nRBC 0.0 - 0.2 % 0.0  0.3High   0.0  0.0  0.0   Neutrophils Relative % % 73  62  51  83  79   Neutro Abs 1.7 - 7.7 K/uL 9.0High   8.5High   1.3Low  R 83.1High  R 11.0High    Lymphocytes Relative % 16  8  33  2  9   Lymphs Abs 0.7 - 4.0 K/uL 1.9  1.0  0.8  1.7  1.2   Monocytes Relative % 6  11  10  0  5   Monocytes Absolute 0.1 - 1.0 K/uL 0.7  1.5High   0.2  0.0Low   0.6   Eosinophils Relative % 0  0  2  0  1   Eosinophils Absolute 0.0 - 0.5 K/uL 0.0  0.0  0.0  0.0  0.1   Basophils Relative % 0  0  0  0  0   Basophils Absolute 0.0 - 0.1 K/uL 0.0  0.0  0.0  0.0 CM 0.0   Immature Granulocytes % 5  19 CM           Ref Range & Units 10:43 5d ago 7d ago 12d ago 2wk ago  Sodium 135 - 145 mmol/L 141  142  137  138  141   Potassium 3.5 - 5.1 mmol/L 3.6  3.6  3.6  3.9  4.0   Chloride 98 - 111 mmol/L 105  104  102  104  107   CO2 22 - 32 mmol/L 26  29  27  26  25    Glucose, Bld 70 - 99 mg/dL 120High   155High   136High   275High   127High    BUN 6 - 20 mg/dL 12  15  13  17  11    Creatinine 0.44 - 1.00 mg/dL 0.78  0.79  0.71  0.78  0.74   Calcium 8.9 - 10.3 mg/dL 9.3  9.4  8.8Low   8.9  8.8Low    Total Protein 6.5 - 8.1 g/dL 7.0  6.6  6.7  6.7  6.7   Albumin 3.5 - 5.0 g/dL 3.3Low   3.2Low   3.3Low   3.2Low   3.1Low    AST 15 - 41 U/L 23  21  14Low   49High   33   ALT 0 - 44 U/L 36  40  44  109High   90High    Alkaline Phosphatase 38 - 126 U/L 113  109  110  163High   121   Total Bilirubin 0.3 - 1.2 mg/dL <0.2Low   <0.2Low   0.3  0.3  <0.2Low    GFR, Est Non Af Am >60  mL/min >60  >60  >60  >60  >60   GFR, Est AFR Am >60 mL/min >60  >60 CM >60 CM >60 CM >60 CM  Comment: (NOTE)   Ferritin 347 Iron/TIBC 53/234 Iron saturation 23%  Diagnostic/Imaging Studies: August 13, 2018 NUCLEAR MEDICINE PET SKULL BASE TO THIGH  TECHNIQUE: 8.0 mCi F-18 FDG was injected intravenously. Full-ring PET imaging was performed from the skull base to thigh after the radiotracer. CT data was obtained and used for attenuation correction and anatomic localization.  Fasting blood glucose: 118 mg/dl  COMPARISON:  None.  FINDINGS: Mediastinal blood pool activity: SUV max 3.06  Liver activity: SUV max 4.06.  NECK: No hypermetabolic lymph nodes in the neck.  Incidental CT findings: none  CHEST: Multiple hypermetabolic left supraclavicular and left axillary lymph nodes.  -index left supraclavicular lymph node has an SUV max of 5.04, Deauville criteria 4.  -index left axillary lymph node measures 1.4 cm and has an SUV max of 9.09, Deauville criteria 5  -index hypermetabolic mass within the anterior mediastinum measures 5.6 cm and has an SUV max of 8.65, Deauville criteria 5  -index lesion within the left hilum measures 3.4 cm and has an SUV max of 11.95, Deauville criteria 5.  No hypermetabolic pulmonary nodules.  Incidental CT findings: none  ABDOMEN/PELVIS: Several areas of  abnormal uptake identified within the abdomen and pelvis.  -index lesion within the caudate lobe of liver measures approximately 1.5 cm and has an SUV max of 6.31, Deauville criteria 4.  -index lesion within the lateral aspect of the left mid spleen measures approximately 1.5 cm within SUV max of 5.5, Deauville criteria 4.  -Mild right iliac adenopathy identified. Index right common iliac node measures 1.1 cm and has an SUV max of 7.34, Deauville criteria 5.  Normal appearance of the adrenal glands. Pancreas unremarkable. No hypermetabolic abdominal  adenopathy.  Incidental CT findings: Large dermoid within the left posterior pelvis measures 8.3 cm.  SKELETON: Mild diffuse increased radiotracer uptake is identified throughout the bone marrow.  Multifocal areas of moderate to marked increased uptake are also noted.  -index lesion with soft tissue component involving the manubrium measures 4.2 cm within SUV max of 17.4, Deauville criteria 5. There are associated mixed lytic and sclerotic changes involving the manubrium.  -index lesion within the right sacrum measures 3 cm and has an SUV max of 10.48, Deauville criteria 5.  Incidental CT findings: none  IMPRESSION: 1. Large hypermetabolic mass within the anterior mediastinum identified compatible with lymphoma, Deauville criteria 5. There is extensive hypermetabolic left supraclavicular, left axillary, left hilar and right iliac adenopathy, Deauville criteria 4 and 5. Additional lesions are identified within the caudate lobe of liver and spleen, Deauville criteria 4. Multifocal hypermetabolic bone lesions are also identified, Deauville criteria 5.  Kerby Moors M.D. 08/13/2018 16:45       Summary/Assessment: In the setting of stage IVB classical Hodgkin lymphoma with evidence of a large anterior mediastinal mass and bulky lymphadenopathy involving the supraclavicular and left axillary lymph node basins; with evidence of bone marrow involvement with Hodgkin lymphoma; having completed previously 1 cycle of brentuximab vedotin, doxorubicin, vinblastine, and dacarbazine, she presents now for laboratory studies and reevaluation prior to day 1 cycle 2 of treatment.  In the interim since her last visit, her overall energy level has improved.  Her appetite has improved while her weight remains stable.  She has occasional damp sweats.  She has no rash or itching.  There is no fever, shaking chills, sweats, or flulike symptoms.  She denies visual changes or hearing  deficit.  She has no headache, dizziness, lightheadedness, syncope, or near syncopal episodes.  She has no epistaxis or hemoptysis.  She reports no pain or difficulty in swallowing.  She has no chest or abdominal pain.  She has no dyspnea at rest.  She has chronic but stable exertional dyspnea unchanged.  There is no cough, sore throat, or orthopnea.  She denies heartburn or indigestion.  No nausea, vomiting, diarrhea, constipation is reported.  Her last menstruation began on October 26.  It lasted for 7 days and was heavier than usual. There is no urinary frequency, urgency, hematuria, dysuria identified. She has no melena or bright red blood per rectum.  She reports no swelling of her ankles. There is no numbness or tingling in the fingers or toes. There are no new focal areas of bone, joint, muscle pain.   Recommendation/Plan: We discussed in detail the results of her laboratory studies.  She continues on ferrous sulfate: 325 mg once daily.  Her white blood cell count is stable.  Her hemoglobin today has declined 9.9 g/dL.  Her platelet counts are normal at 243.  Because her RDW has been consistently elevated, and in spite of her oral replacement of vitamin B12, the metabolites of both, methylmalonic  acid and serum homocysteine, were requested.  Prior to administration of day 1, cycle 2 of brentuximab, doxorubicin, vinblastine, and dacarbazine, antiemetics will be given as per protocol.  Following completion of the day 1, cycle 2 of treatment, pegfilgrastim: 6 mg subcutaneously will be given on day 2 of treatment.  In the event of delayed chemotherapy-induced nausea, she has ondansetron, prochlorperazine, and lorazepam available.   It was strongly advised that she obtain a primary care physician to manage routine health maintenance issues.  Questions were answered to her satisfaction.  Although she has not had a pulmonary embolism, rivaroxaban: 10 mg once daily was previously prescribed.  A  prescription for refill was given for 1 month.  She has had no adverse reaction or bleeding tendency.  Because of her PICC line, PICC line flushes will be on Mondays and Fridays.  Her next PICC flush is on November 15.  Thereafter, PICC line flushes will be on November 18 and November 22.  Barring any unforeseen complications, her next scheduled doctor visit with Dr. Irene Limbo including laboratory studies is on November 25 prior to cycle 2-day 15 of treatment as outlined above.  She was advised to call us in the interim should any new or untoward problems arise.  The total time spent discussing her laboratory studies, previous history, physical examination, and proposed treatment, with recommendations was 40 minutes. At least 50% of that time was spent in face-to-face discussion, reviewing records, laboratory evaluation, counseling, and answering questions. There was ample time allotted to answer all questions.  This note was dictated using voice activated technology/software.  Unfortunately, typographical errors are not uncommon, and transcription is subject to mistakes and regrettably misinterpretation.  If necessary, clarification of the above information can be discussed with me at any time.  FOLLOW UP: AS DIRECTED   cc:   Sullivan Lone MD   Henreitta Leber, MD  Hematology/Oncology [facility] [address] [place], [place] [address] Office: [phone] EPPI: [phone]

## 2018-09-06 NOTE — Patient Instructions (Signed)
Implanted Port Home Guide An implanted port is a type of central line that is placed under the skin. Central lines are used to provide IV access when treatment or nutrition needs to be given through a person's veins. Implanted ports are used for long-term IV access. An implanted port may be placed because:  You need IV medicine that would be irritating to the small veins in your hands or arms.  You need long-term IV medicines, such as antibiotics.  You need IV nutrition for a long period.  You need frequent blood draws for lab tests.  You need dialysis.  Implanted ports are usually placed in the chest area, but they can also be placed in the upper arm, the abdomen, or the leg. An implanted port has two main parts:  Reservoir. The reservoir is round and will appear as a small, raised area under your skin. The reservoir is the part where a needle is inserted to give medicines or draw blood.  Catheter. The catheter is a thin, flexible tube that extends from the reservoir. The catheter is placed into a large vein. Medicine that is inserted into the reservoir goes into the catheter and then into the vein.  How will I care for my incision site? Do not get the incision site wet. Bathe or shower as directed by your health care provider. How is my port accessed? Special steps must be taken to access the port:  Before the port is accessed, a numbing cream can be placed on the skin. This helps numb the skin over the port site.  Your health care provider uses a sterile technique to access the port. ? Your health care provider must put on a mask and sterile gloves. ? The skin over your port is cleaned carefully with an antiseptic and allowed to dry. ? The port is gently pinched between sterile gloves, and a needle is inserted into the port.  Only "non-coring" port needles should be used to access the port. Once the port is accessed, a blood return should be checked. This helps ensure that the port  is in the vein and is not clogged.  If your port needs to remain accessed for a constant infusion, a clear (transparent) bandage will be placed over the needle site. The bandage and needle will need to be changed every week, or as directed by your health care provider.  Keep the bandage covering the needle clean and dry. Do not get it wet. Follow your health care provider's instructions on how to take a shower or bath while the port is accessed.  If your port does not need to stay accessed, no bandage is needed over the port.  What is flushing? Flushing helps keep the port from getting clogged. Follow your health care provider's instructions on how and when to flush the port. Ports are usually flushed with saline solution or a medicine called heparin. The need for flushing will depend on how the port is used.  If the port is used for intermittent medicines or blood draws, the port will need to be flushed: ? After medicines have been given. ? After blood has been drawn. ? As part of routine maintenance.  If a constant infusion is running, the port may not need to be flushed.  How long will my port stay implanted? The port can stay in for as long as your health care provider thinks it is needed. When it is time for the port to come out, surgery will be   done to remove it. The procedure is similar to the one performed when the port was put in. When should I seek immediate medical care? When you have an implanted port, you should seek immediate medical care if:  You notice a bad smell coming from the incision site.  You have swelling, redness, or drainage at the incision site.  You have more swelling or pain at the port site or the surrounding area.  You have a fever that is not controlled with medicine.  This information is not intended to replace advice given to you by your health care provider. Make sure you discuss any questions you have with your health care provider. Document  Released: 10/13/2005 Document Revised: 03/20/2016 Document Reviewed: 06/20/2013 Elsevier Interactive Patient Education  2017 [employer]  

## 2018-09-06 NOTE — Patient Instructions (Addendum)
[place] Discharge Instructions for Patients Receiving Chemotherapy  Wednesday's 09/08/2018 appt is for [facility]  Injection And a flush of PICC appt is at 12:45  Today you received the following chemotherapy agents Doxirubicin, Dacarbazine, Vinblastine and Bleomycin  To help prevent nausea and vomiting after your treatment, we encourage you to take your nausea medication as prescribed.    If you develop nausea and vomiting that is not controlled by your nausea medication, call the clinic.   BELOW ARE SYMPTOMS THAT SHOULD BE REPORTED IMMEDIATELY:  *FEVER GREATER THAN 100.5 F  *CHILLS WITH OR WITHOUT FEVER  NAUSEA AND VOMITING THAT IS NOT CONTROLLED WITH YOUR NAUSEA MEDICATION  *UNUSUAL SHORTNESS OF BREATH  *UNUSUAL BRUISING OR BLEEDING  TENDERNESS IN MOUTH AND THROAT WITH OR WITHOUT PRESENCE OF ULCERS  *URINARY PROBLEMS  *BOWEL PROBLEMS  UNUSUAL RASH Items with * indicate a potential emergency and should be followed up as soon as possible.  Feel free to call the clinic should you have any questions or concerns. The clinic phone number is [phone].  Please show the [place] at check-in to the Emergency Department and triage nurse.

## 2018-09-06 NOTE — Patient Instructions (Addendum)
We discussed in detail the results of your laboratory studies from today.  Your anemia is slightly worse. Your white blood cell count and platelets are acceptable.  Additional laboratory studies were obtained today to assess your absorption of vitamin K81 and folic acid.  These are cofactors in the production of normal red blood cells.  In addition iron studies were obtained to exclude iron deficiency anemia since you are now menstruating again.  He will receive treatment today with multiagent chemotherapy (day 1, cycle 2).  You did not need to take dexamethasone following your treatment.  It was strongly advised to obtain a primary care physician for routine health maintenance.  Continue Xarelto: 10 mg once daily.  Your next port flush will be on Friday, November 15.  Subsequent PICC line flushes will be on Mondays and Fridays: November 18 and November 22.  Barring any unforeseen complications, your next scheduled doctor visit with laboratory studies is on November 25 prior to cycle 2, day 15 of treatment.  Please do not hesitate to call should any new or untoward problems arise in the interim.  Thank you! Ladona Ridgel, MD Hematology/Oncology

## 2018-09-07 ENCOUNTER — Other Ambulatory Visit: Payer: Self-pay

## 2018-09-07 ENCOUNTER — Telehealth: Payer: Self-pay | Admitting: *Deleted

## 2018-09-07 ENCOUNTER — Inpatient Hospital Stay: Payer: Medicaid Other

## 2018-09-07 LAB — HOMOCYSTEINE: Homocysteine: 6.6 umol/L (ref 0.0–15.0)

## 2018-09-07 MED ORDER — PEGFILGRASTIM-CBQV 6 MG/0.6ML ~~LOC~~ SOSY
PREFILLED_SYRINGE | SUBCUTANEOUS | Status: AC
  Filled 2018-09-07: qty 0.6

## 2018-09-07 NOTE — Telephone Encounter (Signed)
Patient left VM stating she had a question about schedule changes and wanted to know when Dr. Irene Limbo was coming back. Called back, reached VM and left following message: Dr. Irene Limbo is returning to the office 11/18. Asked her to call back with any specific schedule questions.

## 2018-09-08 ENCOUNTER — Encounter: Payer: Self-pay | Admitting: *Deleted

## 2018-09-08 ENCOUNTER — Inpatient Hospital Stay: Payer: Medicaid Other

## 2018-09-08 ENCOUNTER — Telehealth: Payer: Self-pay | Admitting: *Deleted

## 2018-09-08 DIAGNOSIS — Z5112 Encounter for antineoplastic immunotherapy: Secondary | ICD-10-CM | POA: Diagnosis not present

## 2018-09-08 DIAGNOSIS — C8198 Hodgkin lymphoma, unspecified, lymph nodes of multiple sites: Secondary | ICD-10-CM

## 2018-09-08 DIAGNOSIS — I871 Compression of vein: Secondary | ICD-10-CM

## 2018-09-08 DIAGNOSIS — Z95828 Presence of other vascular implants and grafts: Secondary | ICD-10-CM

## 2018-09-08 DIAGNOSIS — Z7189 Other specified counseling: Secondary | ICD-10-CM

## 2018-09-08 LAB — METHYLMALONIC ACID, SERUM: Methylmalonic Acid, Quantitative: 117 nmol/L (ref 0–378)

## 2018-09-08 MED ORDER — PEGFILGRASTIM-CBQV 6 MG/0.6ML ~~LOC~~ SOSY
PREFILLED_SYRINGE | SUBCUTANEOUS | Status: AC
  Filled 2018-09-08: qty 0.6

## 2018-09-08 MED ORDER — SODIUM CHLORIDE 0.9% FLUSH
10.0000 mL | INTRAVENOUS | Status: DC | PRN
  Administered 2018-09-08: 10 mL
  Filled 2018-09-08: qty 10

## 2018-09-08 MED ORDER — PEGFILGRASTIM-CBQV 6 MG/0.6ML ~~LOC~~ SOSY
6.0000 mg | PREFILLED_SYRINGE | Freq: Once | SUBCUTANEOUS | Status: AC
  Administered 2018-09-08: 6 mg via SUBCUTANEOUS

## 2018-09-08 MED ORDER — HEPARIN SOD (PORK) LOCK FLUSH 100 UNIT/ML IV SOLN
500.0000 [IU] | Freq: Once | INTRAVENOUS | Status: AC | PRN
  Administered 2018-09-08: 500 [IU]
  Filled 2018-09-08: qty 5

## 2018-09-08 NOTE — Telephone Encounter (Signed)
Out of work letter provided for patient. To be picked up today.

## 2018-09-08 NOTE — Telephone Encounter (Signed)
Received TC from patient stating that she needs a letter stating that she will be out of work for 6 months due to her treatment for Hodgkin's Lymphoma. She is coming in today 2 12:45 for an injection and would like to pick up the letter at that time.

## 2018-09-08 NOTE — Telephone Encounter (Signed)
Cassandra Clark in laboratory called. Patient there to have labs drawn as ordered. Patient stated she'd had labs done on Monday and had been told by Dr. Audelia Hives that she was having too many labs drawn and wanted to skip today. Patient has labs ordered Monday.

## 2018-09-10 ENCOUNTER — Inpatient Hospital Stay: Payer: Medicaid Other

## 2018-09-10 DIAGNOSIS — Z95828 Presence of other vascular implants and grafts: Secondary | ICD-10-CM

## 2018-09-10 DIAGNOSIS — Z5112 Encounter for antineoplastic immunotherapy: Secondary | ICD-10-CM | POA: Diagnosis not present

## 2018-09-10 MED ORDER — HEPARIN SOD (PORK) LOCK FLUSH 100 UNIT/ML IV SOLN
500.0000 [IU] | Freq: Once | INTRAVENOUS | Status: AC | PRN
  Administered 2018-09-10: 500 [IU]
  Filled 2018-09-10: qty 5

## 2018-09-10 MED ORDER — SODIUM CHLORIDE 0.9% FLUSH
10.0000 mL | INTRAVENOUS | Status: DC | PRN
  Administered 2018-09-10: 10 mL
  Filled 2018-09-10: qty 10

## 2018-09-13 ENCOUNTER — Inpatient Hospital Stay: Payer: Medicaid Other

## 2018-09-13 DIAGNOSIS — C8198 Hodgkin lymphoma, unspecified, lymph nodes of multiple sites: Secondary | ICD-10-CM

## 2018-09-13 DIAGNOSIS — Z5112 Encounter for antineoplastic immunotherapy: Secondary | ICD-10-CM | POA: Diagnosis not present

## 2018-09-13 DIAGNOSIS — Z95828 Presence of other vascular implants and grafts: Secondary | ICD-10-CM

## 2018-09-13 LAB — CMP (CANCER CENTER ONLY)
ALK PHOS: 115 U/L (ref 38–126)
ALT: 31 U/L (ref 0–44)
AST: 17 U/L (ref 15–41)
Albumin: 3.7 g/dL (ref 3.5–5.0)
Anion gap: 12 (ref 5–15)
BUN: 11 mg/dL (ref 6–20)
CALCIUM: 10.1 mg/dL (ref 8.9–10.3)
CO2: 27 mmol/L (ref 22–32)
CREATININE: 0.74 mg/dL (ref 0.44–1.00)
Chloride: 100 mmol/L (ref 98–111)
Glucose, Bld: 140 mg/dL — ABNORMAL HIGH (ref 70–99)
Potassium: 3.9 mmol/L (ref 3.5–5.1)
Sodium: 139 mmol/L (ref 135–145)
TOTAL PROTEIN: 7.2 g/dL (ref 6.5–8.1)
Total Bilirubin: 0.3 mg/dL (ref 0.3–1.2)

## 2018-09-13 LAB — CBC WITH DIFFERENTIAL/PLATELET
Abs Immature Granulocytes: 0.22 10*3/uL — ABNORMAL HIGH (ref 0.00–0.07)
BASOS ABS: 0 10*3/uL (ref 0.0–0.1)
Basophils Relative: 1 %
EOS ABS: 0 10*3/uL (ref 0.0–0.5)
EOS PCT: 1 %
HCT: 33 % — ABNORMAL LOW (ref 36.0–46.0)
Hemoglobin: 10.4 g/dL — ABNORMAL LOW (ref 12.0–15.0)
Immature Granulocytes: 5 %
LYMPHS ABS: 0.7 10*3/uL (ref 0.7–4.0)
Lymphocytes Relative: 17 %
MCH: 25.4 pg — AB (ref 26.0–34.0)
MCHC: 31.5 g/dL (ref 30.0–36.0)
MCV: 80.7 fL (ref 80.0–100.0)
Monocytes Absolute: 0.6 10*3/uL (ref 0.1–1.0)
Monocytes Relative: 15 %
NRBC: 0 % (ref 0.0–0.2)
Neutro Abs: 2.7 10*3/uL (ref 1.7–7.7)
Neutrophils Relative %: 61 %
PLATELETS: 295 10*3/uL (ref 150–400)
RBC: 4.09 MIL/uL (ref 3.87–5.11)
RDW: 19.6 % — ABNORMAL HIGH (ref 11.5–15.5)
WBC: 4.3 10*3/uL (ref 4.0–10.5)

## 2018-09-13 MED ORDER — SODIUM CHLORIDE 0.9% FLUSH
10.0000 mL | INTRAVENOUS | Status: DC | PRN
  Administered 2018-09-13: 10 mL
  Filled 2018-09-13: qty 10

## 2018-09-13 MED ORDER — HEPARIN SOD (PORK) LOCK FLUSH 100 UNIT/ML IV SOLN
500.0000 [IU] | Freq: Once | INTRAVENOUS | Status: AC | PRN
  Administered 2018-09-13: 250 [IU]
  Filled 2018-09-13: qty 5

## 2018-09-15 ENCOUNTER — Inpatient Hospital Stay: Payer: Medicaid Other

## 2018-09-17 ENCOUNTER — Inpatient Hospital Stay: Payer: Medicaid Other

## 2018-09-17 DIAGNOSIS — C8198 Hodgkin lymphoma, unspecified, lymph nodes of multiple sites: Secondary | ICD-10-CM

## 2018-09-17 DIAGNOSIS — Z95828 Presence of other vascular implants and grafts: Secondary | ICD-10-CM

## 2018-09-17 DIAGNOSIS — Z5112 Encounter for antineoplastic immunotherapy: Secondary | ICD-10-CM | POA: Diagnosis not present

## 2018-09-17 LAB — CBC WITH DIFFERENTIAL/PLATELET
Abs Immature Granulocytes: 5.35 10*3/uL — ABNORMAL HIGH (ref 0.00–0.07)
BASOS ABS: 0.1 10*3/uL (ref 0.0–0.1)
BASOS PCT: 0 %
EOS PCT: 0 %
Eosinophils Absolute: 0.1 10*3/uL (ref 0.0–0.5)
HEMATOCRIT: 31 % — AB (ref 36.0–46.0)
Hemoglobin: 9.5 g/dL — ABNORMAL LOW (ref 12.0–15.0)
Immature Granulocytes: 18 %
LYMPHS PCT: 4 %
Lymphs Abs: 1.2 10*3/uL (ref 0.7–4.0)
MCH: 25.6 pg — AB (ref 26.0–34.0)
MCHC: 30.6 g/dL (ref 30.0–36.0)
MCV: 83.6 fL (ref 80.0–100.0)
Monocytes Absolute: 1.9 10*3/uL — ABNORMAL HIGH (ref 0.1–1.0)
Monocytes Relative: 7 %
NRBC: 0.6 % — AB (ref 0.0–0.2)
Neutro Abs: 20.6 10*3/uL — ABNORMAL HIGH (ref 1.7–7.7)
Neutrophils Relative %: 71 %
Platelets: 329 10*3/uL (ref 150–400)
RBC: 3.71 MIL/uL — AB (ref 3.87–5.11)
RDW: 21.8 % — AB (ref 11.5–15.5)
WBC: 29.2 10*3/uL — AB (ref 4.0–10.5)

## 2018-09-17 LAB — CMP (CANCER CENTER ONLY)
ALT: 80 U/L — AB (ref 0–44)
AST: 32 U/L (ref 15–41)
Albumin: 3.6 g/dL (ref 3.5–5.0)
Alkaline Phosphatase: 137 U/L — ABNORMAL HIGH (ref 38–126)
Anion gap: 11 (ref 5–15)
BUN: 9 mg/dL (ref 6–20)
CHLORIDE: 104 mmol/L (ref 98–111)
CO2: 28 mmol/L (ref 22–32)
CREATININE: 0.78 mg/dL (ref 0.44–1.00)
Calcium: 9.2 mg/dL (ref 8.9–10.3)
Glucose, Bld: 114 mg/dL — ABNORMAL HIGH (ref 70–99)
Potassium: 3.8 mmol/L (ref 3.5–5.1)
Sodium: 143 mmol/L (ref 135–145)
TOTAL PROTEIN: 6.7 g/dL (ref 6.5–8.1)
Total Bilirubin: <0.2 mg/dL — ABNORMAL LOW (ref 0.3–1.2)

## 2018-09-17 MED ORDER — SODIUM CHLORIDE 0.9% FLUSH
10.0000 mL | INTRAVENOUS | Status: DC | PRN
  Administered 2018-09-17: 10 mL
  Filled 2018-09-17: qty 10

## 2018-09-17 MED ORDER — HEPARIN SOD (PORK) LOCK FLUSH 100 UNIT/ML IV SOLN
500.0000 [IU] | Freq: Once | INTRAVENOUS | Status: AC | PRN
  Administered 2018-09-17: 500 [IU]
  Filled 2018-09-17: qty 5

## 2018-09-20 ENCOUNTER — Inpatient Hospital Stay: Payer: Medicaid Other

## 2018-09-20 ENCOUNTER — Telehealth: Payer: Self-pay | Admitting: *Deleted

## 2018-09-20 ENCOUNTER — Other Ambulatory Visit: Payer: Self-pay | Admitting: Hematology

## 2018-09-20 ENCOUNTER — Inpatient Hospital Stay (HOSPITAL_BASED_OUTPATIENT_CLINIC_OR_DEPARTMENT_OTHER): Payer: Medicaid Other | Admitting: Hematology

## 2018-09-20 ENCOUNTER — Encounter: Payer: Self-pay | Admitting: Hematology

## 2018-09-20 VITALS — BP 136/76 | HR 109 | Temp 98.0°F | Resp 18

## 2018-09-20 VITALS — BP 118/73 | HR 115 | Temp 98.8°F | Resp 22 | Ht 67.0 in | Wt 163.1 lb

## 2018-09-20 DIAGNOSIS — Z7189 Other specified counseling: Secondary | ICD-10-CM

## 2018-09-20 DIAGNOSIS — C8198 Hodgkin lymphoma, unspecified, lymph nodes of multiple sites: Secondary | ICD-10-CM

## 2018-09-20 DIAGNOSIS — Z79899 Other long term (current) drug therapy: Secondary | ICD-10-CM | POA: Diagnosis not present

## 2018-09-20 DIAGNOSIS — D649 Anemia, unspecified: Secondary | ICD-10-CM | POA: Diagnosis not present

## 2018-09-20 DIAGNOSIS — E538 Deficiency of other specified B group vitamins: Secondary | ICD-10-CM | POA: Diagnosis not present

## 2018-09-20 DIAGNOSIS — I871 Compression of vein: Secondary | ICD-10-CM

## 2018-09-20 DIAGNOSIS — Z5112 Encounter for antineoplastic immunotherapy: Secondary | ICD-10-CM | POA: Diagnosis not present

## 2018-09-20 DIAGNOSIS — Z95828 Presence of other vascular implants and grafts: Secondary | ICD-10-CM

## 2018-09-20 LAB — CBC WITH DIFFERENTIAL/PLATELET
ABS IMMATURE GRANULOCYTES: 2 10*3/uL — AB (ref 0.00–0.07)
BASOS PCT: 0 %
Basophils Absolute: 0.1 10*3/uL (ref 0.0–0.1)
Eosinophils Absolute: 0.1 10*3/uL (ref 0.0–0.5)
Eosinophils Relative: 0 %
HCT: 31.1 % — ABNORMAL LOW (ref 36.0–46.0)
Hemoglobin: 9.6 g/dL — ABNORMAL LOW (ref 12.0–15.0)
IMMATURE GRANULOCYTES: 11 %
Lymphocytes Relative: 5 %
Lymphs Abs: 0.9 10*3/uL (ref 0.7–4.0)
MCH: 25.9 pg — ABNORMAL LOW (ref 26.0–34.0)
MCHC: 30.9 g/dL (ref 30.0–36.0)
MCV: 83.8 fL (ref 80.0–100.0)
MONOS PCT: 5 %
Monocytes Absolute: 0.9 10*3/uL (ref 0.1–1.0)
NEUTROS ABS: 14 10*3/uL — AB (ref 1.7–7.7)
NEUTROS PCT: 79 %
PLATELETS: 214 10*3/uL (ref 150–400)
RBC: 3.71 MIL/uL — ABNORMAL LOW (ref 3.87–5.11)
RDW: 22.5 % — ABNORMAL HIGH (ref 11.5–15.5)
WBC: 17.9 10*3/uL — ABNORMAL HIGH (ref 4.0–10.5)
nRBC: 0.4 % — ABNORMAL HIGH (ref 0.0–0.2)

## 2018-09-20 LAB — CMP (CANCER CENTER ONLY)
ALBUMIN: 3.5 g/dL (ref 3.5–5.0)
ALT: 41 U/L (ref 0–44)
ANION GAP: 9 (ref 5–15)
AST: 20 U/L (ref 15–41)
Alkaline Phosphatase: 114 U/L (ref 38–126)
BILIRUBIN TOTAL: 0.2 mg/dL — AB (ref 0.3–1.2)
BUN: 10 mg/dL (ref 6–20)
CHLORIDE: 106 mmol/L (ref 98–111)
CO2: 25 mmol/L (ref 22–32)
Calcium: 9.1 mg/dL (ref 8.9–10.3)
Creatinine: 0.78 mg/dL (ref 0.44–1.00)
GFR, Est AFR Am: >60 mL/min (ref >60)
GFR, Estimated: >60 mL/min (ref >60)
GLUCOSE: 127 mg/dL — AB (ref 70–99)
POTASSIUM: 3.7 mmol/L (ref 3.5–5.1)
SODIUM: 140 mmol/L (ref 135–145)
TOTAL PROTEIN: 6.6 g/dL (ref 6.5–8.1)

## 2018-09-20 MED ORDER — ACETAMINOPHEN 325 MG PO TABS
ORAL_TABLET | ORAL | Status: AC
  Filled 2018-09-20: qty 2

## 2018-09-20 MED ORDER — PALONOSETRON HCL INJECTION 0.25 MG/5ML
INTRAVENOUS | Status: AC
  Filled 2018-09-20: qty 5

## 2018-09-20 MED ORDER — B-12 1000 MCG SL SUBL: 2000.0000 ug | SUBLINGUAL_TABLET | Freq: Every day | SUBLINGUAL | 3 refills | Status: DC

## 2018-09-20 MED ORDER — SODIUM CHLORIDE 0.9 % IV SOLN
Freq: Once | INTRAVENOUS | Status: AC
  Administered 2018-09-20: 11:00:00 via INTRAVENOUS
  Filled 2018-09-20: qty 250

## 2018-09-20 MED ORDER — B COMPLEX-C PO TABS: 1.0000 | ORAL_TABLET | Freq: Every day | ORAL | 3 refills | Status: AC

## 2018-09-20 MED ORDER — SODIUM CHLORIDE 0.9 % IV SOLN
375.0000 mg/m2 | Freq: Once | INTRAVENOUS | Status: AC
  Administered 2018-09-20: 690 mg via INTRAVENOUS
  Filled 2018-09-20: qty 69

## 2018-09-20 MED ORDER — SODIUM CHLORIDE 0.9 % IV SOLN
Freq: Once | INTRAVENOUS | Status: AC
  Administered 2018-09-20: 11:00:00 via INTRAVENOUS
  Filled 2018-09-20: qty 5

## 2018-09-20 MED ORDER — ACETAMINOPHEN 325 MG PO TABS
650.0000 mg | ORAL_TABLET | Freq: Once | ORAL | Status: AC
  Administered 2018-09-20: 650 mg via ORAL

## 2018-09-20 MED ORDER — SODIUM CHLORIDE 0.9% FLUSH
10.0000 mL | INTRAVENOUS | Status: DC | PRN
  Administered 2018-09-20: 10 mL
  Filled 2018-09-20: qty 10

## 2018-09-20 MED ORDER — DIPHENHYDRAMINE HCL 25 MG PO TABS
25.0000 mg | ORAL_TABLET | Freq: Once | ORAL | Status: AC
  Administered 2018-09-20: 25 mg via ORAL
  Filled 2018-09-20: qty 1

## 2018-09-20 MED ORDER — DIPHENHYDRAMINE HCL 25 MG PO CAPS
ORAL_CAPSULE | ORAL | Status: AC
  Filled 2018-09-20: qty 1

## 2018-09-20 MED ORDER — DOXORUBICIN HCL CHEMO IV INJECTION 2 MG/ML
25.0000 mg/m2 | Freq: Once | INTRAVENOUS | Status: AC
  Administered 2018-09-20: 46 mg via INTRAVENOUS
  Filled 2018-09-20: qty 23

## 2018-09-20 MED ORDER — VINBLASTINE SULFATE CHEMO INJECTION 1 MG/ML
6.0000 mg/m2 | Freq: Once | INTRAVENOUS | Status: AC
  Administered 2018-09-20: 11 mg via INTRAVENOUS
  Filled 2018-09-20: qty 11

## 2018-09-20 MED ORDER — HEPARIN SOD (PORK) LOCK FLUSH 100 UNIT/ML IV SOLN
250.0000 [IU] | Freq: Once | INTRAVENOUS | Status: AC | PRN
  Administered 2018-09-20: 250 [IU]
  Filled 2018-09-20: qty 5

## 2018-09-20 MED ORDER — DEXAMETHASONE 4 MG PO TABS: ORAL_TABLET | ORAL | 1 refills | Status: DC

## 2018-09-20 MED ORDER — PALONOSETRON HCL INJECTION 0.25 MG/5ML
0.2500 mg | Freq: Once | INTRAVENOUS | Status: AC
  Administered 2018-09-20: 0.25 mg via INTRAVENOUS

## 2018-09-20 MED ORDER — SODIUM CHLORIDE 0.9 % IV SOLN
1.2000 mg/kg | Freq: Once | INTRAVENOUS | Status: AC
  Administered 2018-09-20: 85 mg via INTRAVENOUS
  Filled 2018-09-20: qty 17

## 2018-09-20 MED FILL — TOTAL B WITH VIT C CAPLET: 130 days supply | Qty: 130 | Fill #0

## 2018-09-20 MED FILL — B-12 2,500 MCG TABLET SL: 2500 MCG | 60 days supply | Qty: 60 | Fill #0

## 2018-09-20 MED FILL — DEXAMETHASONE 4 MG TABLET: 4 | 8 days supply | Qty: 30 | Fill #0

## 2018-09-20 NOTE — Telephone Encounter (Signed)
Contacted by [place] with 2 questions: 1) B-complex w/C was ordered to dispense #30 tablets. It is OTC and comes in bottle of #130. Is it okay to dispense full #130 bottle with same directions? 2) Cyancobalimin (B-12) 1000 mcg is not available. They have Cyanocobalamin (B12) in 2521mcg available. Can that be substituted by changing directions to take 1 of the 2539mcg tablet per day instead of 2 of the 1040mcg tablet? Per Dr. Irene Limbo, both order changes are acceptable. Contacted Aaron Edelman at [facility] to make changes. Aaron Edelman read changes back.

## 2018-09-20 NOTE — Patient Instructions (Signed)
[facility] Discharge Instructions for Patients Receiving Chemotherapy  Today you received the following chemotherapy agents Doxirubicin, Dacarbazine, Vinblastine and Bleomycin  To help prevent nausea and vomiting after your treatment, we encourage you to take your nausea medication as prescribed.    If you develop nausea and vomiting that is not controlled by your nausea medication, call the clinic.   BELOW ARE SYMPTOMS THAT SHOULD BE REPORTED IMMEDIATELY:  *FEVER GREATER THAN 100.5 F  *CHILLS WITH OR WITHOUT FEVER  NAUSEA AND VOMITING THAT IS NOT CONTROLLED WITH YOUR NAUSEA MEDICATION  *UNUSUAL SHORTNESS OF BREATH  *UNUSUAL BRUISING OR BLEEDING  TENDERNESS IN MOUTH AND THROAT WITH OR WITHOUT PRESENCE OF ULCERS  *URINARY PROBLEMS  *BOWEL PROBLEMS  UNUSUAL RASH Items with * indicate a potential emergency and should be followed up as soon as possible.  Feel free to call the clinic should you have any questions or concerns. The clinic phone number is [phone].  Please show the [place] at check-in to the Emergency Department and triage nurse.

## 2018-09-20 NOTE — Progress Notes (Signed)
Per Dr. Irene Limbo ok to treat with pulse 106

## 2018-09-20 NOTE — Patient Instructions (Signed)
Thank you for choosing [facility] to provide your oncology and hematology care.  To afford each patient quality time with our providers, please arrive 30 minutes before your scheduled appointment time.  If you arrive late for your appointment, you may be asked to reschedule.  We strive to give you quality time with our providers, and arriving late affects you and other patients whose appointments are after yours.    If you are a no show for multiple scheduled visits, you may be dismissed from the clinic at the providers discretion.     Again, thank you for choosing [facility], our hope is that these requests will decrease the amount of time that you wait before being seen by our physicians.  ______________________________________________________________________   Should you have questions after your visit to the [facility], please contact our office at [phone] between the hours of 8:30 and 4:30 p.m.    Voicemails left after 4:30p.m will not be returned until the following business day.     For prescription refill requests, please have your pharmacy contact us directly.  Please also try to allow 48 hours for prescription requests.     Please contact the scheduling department for questions regarding scheduling.  For scheduling of procedures such as PET scans, CT scans, MRI, Ultrasound, etc please contact central scheduling at [phone].     Resources For Cancer Patients and Caregivers:    Oncolink.org:  A wonderful resource for patients and healthcare providers for information regarding your disease, ways to tract your treatment, what to expect, etc.      American Cancer Society:  800-227-2345  Can help patients locate various types of support and financial assistance   Cancer Care: 1-800-813-HOPE (4673) Provides financial assistance, online support groups, medication/co-pay assistance.     [place] DSS:  336-641-3447 Where to apply  for food stamps, Medicaid, and utility assistance   Medicare Rights Center: 800-333-4114 Helps people with Medicare understand their rights and benefits, navigate the Medicare system, and secure the quality healthcare they deserve   SCAT: 336-333-6589 [place] [employer]'s shared-ride transportation service for eligible riders who have a disability that prevents them from riding the fixed route bus.     For additional information on assistance programs please contact our social worker:   Cassandra Clark:  [phone]  

## 2018-09-20 NOTE — Progress Notes (Signed)
HEMATOLOGY/ONCOLOGY CONSULTATION NOTE  Date of Service: 09/20/2018  Patient Care Team: Patient, No Pcp Per as PCP - General (General Practice)  CHIEF COMPLAINTS/PURPOSE OF CONSULTATION:  F/u for Mx of Hodgkins lymphoma  HISTORY OF PRESENTING ILLNESS:   Cassandra Clark is a wonderful 37 y.o. female who has been referred to [place] by Dr. 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   Patient has a mild anemia and presented to the emergency room with new onset fevers chills and a 2-week history of worsening night sweats dry cough fatigue and anorexia.  Patient notes that she initially started feeling unwell from late June when she had noted increasing fatigue that required her to rest for longer periods of time.  She also noted that she at times felt lightheaded and dizzy when she bent forward.  She notes a decreased appetite.  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 She notes that she has not been able to do much of anything else other than rest at home and work to some degree at her job.  No chest pain no palpitations no diaphoresis no lower extremity edema.  No abdominal pain no nausea no vomiting no diarrhea.  No rectal bleeding. Notes that her periods have been regular.  In the emergency room the patient was noted to have a fever of 102.4 F and tachycardia related to this.  Blood counts showed that the patient was anemic with a hemoglobin around 8 with normal platelets and a WBC count of 3.6k.  Magnesium 1.9 and phosphorus of 2. She was also noted to be vitamin B12 deficient.  Most recent lab results (08/04/18) of CBC w/diff and CMP is as follows: all values are WNL except for WBC at 3.0k RBC at 3.64, HGB at 8.3, HCT at 82.7, MCV at 78.8, MCH at 22.8, MCHC at 28.9, RDW at 18.1, PLT at 459k, Lymphs abs at 300,  Calcium at 8.0, Albumin at 2.7. 08/04/18 LDH at 203 08/04/18 Vitamin B12 at 135  X-ray of the chest done on 08/03/2018 showed Right paratracheal and left AP window masses likely representing lymphadenopathy. Consider CT chest for further characterization.  CT of the chest with contrast was subsequently done which showed 1. 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 In addition there are multiple nodular lesions in the spleen and possibly in the liver, as well as permeative destruction of the sternal manubrium with tumor rind anterior and posterior to the sternum. 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 Left-sided small cell lung cancer might give a similar appearance. Aggressive germ-cell tumor or thymic carcinoma or less likely differential diagnostic considerations. Tissue diagnosis recommended. 2. The anterior mediastinal mass is causing narrowing of the SVC, and mild invasion of the SVC with slight marginal thrombosis is not readily excluded. No large pulmonary embolus is identified.  Patient had additional lab testing which showed a near normal LDH was 203. Sedimentation rate was done subsequently and was noted to be elevated.  Urine pregnancy test was within normal limits.  Patient was noted to have significant headaches over the last 4 to 6 weeks and had a CT of the head without contrast in the ED on 07/04/2018 which showed  no acute intracranial normalities.  On review of systems, pt reports fevers, unquantified weight loss, shortness of breath, night sweats and denies overt chest pain, rashes.   Interval History:   Cassandra Clark returns today for management, evaluation and C2D15 of her A-AVD treatment of her Hodgkin's Lymphoma. The patient's last visit with [place] was on 08/23/18, and she did see my colleague Dr. Audelia Hives in  the interim. She is accompanied today by a friend. The pt reports that she is doing well overall.   The pt reports that she had some nausea for a few days in the interim. She took Zofran to mitigate her nausea. She notes that she has begun feeling better and has been eating well. The pt notes that she had loose stools yesterday and today, and denies frequency, urgency, or watery stools. The pt notes that she had pinto beans last night, and does not normally eat beans, and attributes her stool pattern to this. She denies any blood or mucous in the stools and denies fevers.   The pt endorses stable and normal breathing, and denies any dizziness when bending or leaning forward. The pt also denies any fevers, chills, or night sweats at this time. Overall, the pt endorses improvement in her previous fatigue.   The pt notes that she developed a rash on her cheeks 4-5 days ago, which resolved on its own and denies any other rashes.   The pt notes that she has not been having her periods.   Lab results today (09/20/18) of CBC w/diff, CMP is as follows: all values are WNL except for WBC at 17.9k, RBC at 3.71, HGB at 9.6, HCT at 31.1, MCH at 25.9, RDW at 22.5, nRBC at 0.4%, ANC at 14.0k, Abs immature granulocytes at 2.00k, Glucose at 127, Total Bilirubin at 0.2.  On review of systems, pt reports resolved nausea, eating well, stable weight, period cessation, and denies fevers, chills, night sweats, dizziness, light headedness, difficulty breathing, diarrhea, blood in the stools, mucous in the stools, mouth sores, abdominal pains, and any other symptoms.   MEDICAL HISTORY:  Past Medical History:  Diagnosis Date  . Anemia     SURGICAL HISTORY: Past Surgical History:  Procedure Laterality Date  . PICC LINE INSERTION Right 08/18/2018    SOCIAL HISTORY: Social History   Socioeconomic History  . Marital status: Legally Separated    Spouse name: Not on file  . Number of children: Not on file  . Years  of education: Not on file  . Highest education level: Not on file  Occupational History  . Not on file  Social Needs  . Financial resource strain: Not on file  . Food insecurity:    Worry: Not on file    Inability: Not on file  . Transportation needs:    Medical: Not on file    Non-medical: Not on file  Tobacco Use  . Smoking status: Never Smoker  . Smokeless tobacco: Never Used  Substance and Sexual Activity  . Alcohol use: No  . Drug use: No    Comment: Drinks tea  . Sexual activity: Not on file  Lifestyle  . Physical activity:    Days per week: Not on file    Minutes per session: Not on file  . Stress: Not on file  Relationships  . Social connections:    Talks on phone: Not on file    Gets together: Not on file    Attends religious service: Not on file  Active member of club or organization: Not on file    Attends meetings of clubs or organizations: Not on file    Relationship status: Not on file  . Intimate partner violence:    Fear of current or ex partner: Not on file    Emotionally abused: Not on file    Physically abused: Not on file    Forced sexual activity: Not on file  Other Topics Concern  . Not on file  Social History Narrative  . Not on file    FAMILY HISTORY: Family History  Problem Relation Age of Onset  . Diabetes Mellitus II Father   . Hypertension Father   . Congestive Heart Failure Maternal Grandmother   . Diabetes Mellitus II Maternal Grandmother   . Diabetes Mellitus II Maternal Uncle   . Congestive Heart Failure Maternal Uncle     ALLERGIES:  is allergic to penicillins.  MEDICATIONS:  Current Outpatient Medications  Medication Sig Dispense Refill  . B Complex-C (B-COMPLEX WITH VITAMIN C) tablet Take 1 tablet by mouth daily. 30 tablet 0  . Cyanocobalamin (B-12) 1000 MCG SUBL Place 2,000 mcg under the tongue daily. 60 each 0  . dexamethasone (DECADRON) 4 MG tablet Take 2 tablets by mouth once a day on the day after chemotherapy and  then take 2 tablets two times a day for 2 days. Take with food. (Patient not taking: Reported on 09/06/2018) 30 tablet 1  . ferrous sulfate 325 (65 FE) MG tablet Take 1 tablet (325 mg total) by mouth daily. (Patient not taking: Reported on 08/23/2018) 14 tablet 0  . HYDROcodone-acetaminophen (NORCO) 5-325 MG tablet Take 1-2 tablets by mouth every 6 (six) hours as needed for moderate pain or severe pain. 30 tablet 0  . lidocaine-prilocaine (EMLA) cream Apply to affected area once 30 g 3  . loratadine (CLARITIN) 10 MG tablet Take 10 mg by mouth daily.    Marland Kitchen LORazepam (ATIVAN) 0.5 MG tablet Take 1 tablet (0.5 mg total) by mouth every 6 (six) hours as needed (Nausea or vomiting). 30 tablet 0  . ondansetron (ZOFRAN) 8 MG tablet Take 1 tablet (8 mg total) by mouth 2 (two) times daily as needed. Start on the third day after chemotherapy. 30 tablet 1  . prochlorperazine (COMPAZINE) 10 MG tablet Take 1 tablet (10 mg total) by mouth every 6 (six) hours as needed (Nausea or vomiting). 30 tablet 1  . rivaroxaban (XARELTO) 10 MG TABS tablet Take 1 tablet (10 mg total) by mouth daily. 30 tablet 1   No current facility-administered medications for this visit.     REVIEW OF SYSTEMS:    A 10+ POINT REVIEW OF SYSTEMS WAS OBTAINED including neurology, dermatology, psychiatry, cardiac, respiratory, lymph, extremities, GI, GU, Musculoskeletal, constitutional, breasts, reproductive, HEENT.  All pertinent positives are noted in the HPI.  All others are negative.   PHYSICAL EXAMINATION: ECOG PERFORMANCE STATUS: 1 - Symptomatic but completely ambulatory  . Vitals:   09/20/18 0940  BP: 118/73  Pulse: (!) 115  Resp: (!) 22  Temp: 98.8 F (37.1 C)  SpO2: 100%   Filed Weights   09/20/18 0940  Weight: 163 lb 1.6 oz (74 kg)   .Body mass index is 25.55 kg/m.  GENERAL:alert, in no acute distress and comfortable SKIN: no acute rashes, no significant lesions EYES: conjunctiva are pink and non-injected, sclera  anicteric OROPHARYNX: MMM, no exudates, no oropharyngeal erythema or ulceration NECK: supple, no JVD LYMPH:  B/l supraclavicular and left axillary lymphadenopathy palpable LUNGS:  clear to auscultation b/l with normal respiratory effort HEART: regular rate & rhythm ABDOMEN:  normoactive bowel sounds , non tender, not distended. No palpable hepatosplenomegaly.  Extremity: trace pedal edema PSYCH: alert & oriented x 3 with fluent speech NEURO: no focal motor/sensory deficits   LABORATORY DATA:  I have reviewed the data as listed  . CBC Latest Ref Rng & Units 09/20/2018 09/17/2018 09/13/2018  WBC 4.0 - 10.5 K/uL 17.9(H) 29.2(H) 4.3  Hemoglobin 12.0 - 15.0 g/dL 9.6(L) 9.5(L) 10.4(L)  Hematocrit 36.0 - 46.0 % 31.1(L) 31.0(L) 33.0(L)  Platelets 150 - 400 K/uL 214 329 295    . CMP Latest Ref Rng & Units 09/20/2018 09/17/2018 09/13/2018  Glucose 70 - 99 mg/dL 127(H) 114(H) 140(H)  BUN 6 - 20 mg/dL 10 9 11   Creatinine 0.44 - 1.00 mg/dL 0.78 0.78 0.74  Sodium 135 - 145 mmol/L 140 143 139  Potassium 3.5 - 5.1 mmol/L 3.7 3.8 3.9  Chloride 98 - 111 mmol/L 106 104 100  CO2 22 - 32 mmol/L 25 28 27   Calcium 8.9 - 10.3 mg/dL 9.1 9.2 10.1  Total Protein 6.5 - 8.1 g/dL 6.6 6.7 7.2  Total Bilirubin 0.3 - 1.2 mg/dL 0.2(L) <0.2(L) 0.3  Alkaline Phos 38 - 126 U/L 114 137(H) 115  AST 15 - 41 U/L 20 32 17  ALT 0 - 44 U/L 41 80(H) 31   Component     Latest Ref Rng & Units 08/04/2018  Total Protein ELP     6.0 - 8.5 g/dL 6.2  Albumin ELP     2.9 - 4.4 g/dL 2.6 (L)  Alpha-1-Globulin     0.0 - 0.4 g/dL 0.3  Alpha-2-Globulin     0.4 - 1.0 g/dL 0.9  Beta Globulin     0.7 - 1.3 g/dL 1.0  Gamma Globulin     0.4 - 1.8 g/dL 1.4  M-SPIKE, %     Not Observed g/dL Not Observed  SPE Interp.      Comment  Comment      Comment  Globulin, Total     2.2 - 3.9 g/dL 3.6  A/G Ratio     0.7 - 1.7 0.7  Iron     28 - 170 ug/dL 23 (L)  TIBC     250 - 450 ug/dL 196 (L)  Saturation Ratios     10.4 -  31.8 % 12  UIBC     ug/dL 173  Retic Ct Pct     0.4 - 3.1 % 2.0  RBC.     3.87 - 5.11 MIL/uL 3.42 (L)  Retic Count, Absolute     19.0 - 186.0 K/uL 69.8  Immature Retic Fract     2.3 - 15.9 % 29.4 (H)  Hepatitis B Surface Ag     Negative Negative  HCV Ab     0.0 - 0.9 s/co ratio 0.1  Hep A Ab, IgM     Negative Negative  Hep B Core Ab, IgM     Negative Negative  Vitamin B12     180 - 914 pg/mL 135 (L)  Folate     >5.9 ng/mL 11.5  Ferritin     11 - 307 ng/mL 91  Uric Acid, Serum     2.5 - 7.1 mg/dL 4.5  LDH     98 - 192 U/L 203 (H)  AFP, Serum, Tumor Marker     0.0 - 8.3 ng/mL 3.4  CEA     0.0 - 4.7 ng/mL  1.4  Beta-2 Microglobulin     0.6 - 2.4 mg/L 2.4  Magnesium     1.7 - 2.4 mg/dL 2.0  Phosphorus     2.5 - 4.6 mg/dL 2.7    08/04/18 Left Cervical Core Biopsy:   08/06/18 BM Bx:    RADIOGRAPHIC STUDIES: I have personally reviewed the radiological images as listed and agreed with the findings in the report. No results found.  ASSESSMENT & PLAN:   37 y.o. 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 LDH - no significantly elevated. 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 With cytopenias concern for BM involvement with Hodgkins lymphoma  08/05/18 ECHO revealed LV EF of 65-70%. No pericardial effusion.   08/06/18 BM Bx revealed involvement by Hodgkin's lymphoma  08/13/18 PET/CT revealed Large hypermetabolic mass within the anterior mediastinum identified compatible with lymphoma, Deauville criteria 5. There is extensive hypermetabolic left supraclavicular, left axillary, left hilar and right iliac adenopathy, Deauville criteria 4 and 5. Additional lesions are identified  within the caudate lobe of liver and spleen, Deauville criteria 4. Multifocal hypermetabolic bone lesions are also identified, Deauville criteria 5   2. 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 SVC compression due to anterior mediastinal mass - no upper extremity , neck or face swelling or change in visiion to suggest overt SVC syndrome  4. Microcytic anemia - normal iron. Likely anemia or chronic disease  5. B12 deficiency ? Pernicious anemia  PLAN:  -The pt does not prefer any fertility preservation strategies- declined lupron. -Pt began C1 A-AVD outpatient on Monday 08/09/18 -Began preventative low dose Xarelto 10mg  po daily at discharge for DVT prophylaxis in the setting of SVC narrowing. 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 on 08/13/18 PET/CT measuring 8.3cm within the left posterior pelvis  -Discussed the recommendation to receive 6 cycles of A-AVD treatment given her Stage IV disease, and again noted the curative intent of treatment  -Recommended salt and baking soda mouthwashes 4-5 times per day -Hydrocodone for as needed use with Udenycha related bone pains -Continue with preventative dose of 10mg  Xarelto  -Discussed pt labwork today, 09/20/18; ANC at 14.0k in setting of G-CSF support, HGB stable at 9.6, PLT normal at 214k, blood chemistries are stable -Will refill Dexamethasone, Vitamin B12 and Vitamin B complex  -The pt has no prohibitive toxicities from continuing C2D15 A-AVD with G-CSF support at this time.   -Will see the pt back in two weeks    Please schedule next 4 doses of chemotherapy with labs as ordered -continue PICC line flushes 2 times weekly RTC with Dr Irene Limbo in 2 weeks with labs   All of the patients questions were answered with apparent satisfaction. The patient knows to call the clinic with any problems,  questions or concerns.  The total time spent in the appt was 25 minutes and more than 50% was on counseling and direct patient cares.    Sullivan Lone MD [place] AAHIVMS [facility] Hematology/Oncology Physician [facility]  (Office):       [phone] (Work cell):  [phone] (Fax):           [phone]  09/20/2018 10:47  AM  I, [place], am acting as a scribe for Dr. Sullivan Lone.   .I have reviewed the above documentation for accuracy and completeness, and I agree with the above. Brunetta Genera MD

## 2018-09-21 ENCOUNTER — Inpatient Hospital Stay: Payer: Medicaid Other

## 2018-09-21 ENCOUNTER — Telehealth: Payer: Self-pay

## 2018-09-21 VITALS — BP 120/72 | HR 101 | Temp 98.0°F | Resp 18

## 2018-09-21 DIAGNOSIS — I871 Compression of vein: Secondary | ICD-10-CM

## 2018-09-21 DIAGNOSIS — C8198 Hodgkin lymphoma, unspecified, lymph nodes of multiple sites: Secondary | ICD-10-CM

## 2018-09-21 DIAGNOSIS — Z7189 Other specified counseling: Secondary | ICD-10-CM

## 2018-09-21 DIAGNOSIS — Z5112 Encounter for antineoplastic immunotherapy: Secondary | ICD-10-CM | POA: Diagnosis not present

## 2018-09-21 MED ORDER — PEGFILGRASTIM-CBQV 6 MG/0.6ML ~~LOC~~ SOSY
6.0000 mg | PREFILLED_SYRINGE | Freq: Once | SUBCUTANEOUS | Status: AC
  Administered 2018-09-21: 6 mg via SUBCUTANEOUS

## 2018-09-21 MED ORDER — PEGFILGRASTIM-CBQV 6 MG/0.6ML ~~LOC~~ SOSY
PREFILLED_SYRINGE | SUBCUTANEOUS | Status: AC
  Filled 2018-09-21: qty 0.6

## 2018-09-21 NOTE — Telephone Encounter (Signed)
Printed patient calender , and called her to pick up after her injection today. Per 11/25 los

## 2018-09-21 NOTE — Telephone Encounter (Signed)
Left a voice message. Per 11/25 los

## 2018-09-22 ENCOUNTER — Inpatient Hospital Stay: Payer: Medicaid Other

## 2018-09-22 DIAGNOSIS — Z5112 Encounter for antineoplastic immunotherapy: Secondary | ICD-10-CM | POA: Diagnosis not present

## 2018-09-22 DIAGNOSIS — Z95828 Presence of other vascular implants and grafts: Secondary | ICD-10-CM

## 2018-09-22 DIAGNOSIS — C8198 Hodgkin lymphoma, unspecified, lymph nodes of multiple sites: Secondary | ICD-10-CM

## 2018-09-22 LAB — CBC WITH DIFFERENTIAL/PLATELET
Abs Immature Granulocytes: 6 10*3/uL — ABNORMAL HIGH (ref 0.00–0.07)
Basophils Absolute: 0.1 10*3/uL (ref 0.0–0.1)
Basophils Relative: 0 %
Eosinophils Absolute: 0 10*3/uL (ref 0.0–0.5)
Eosinophils Relative: 0 %
HCT: 28.6 % — ABNORMAL LOW (ref 36.0–46.0)
Hemoglobin: 9.1 g/dL — ABNORMAL LOW (ref 12.0–15.0)
Immature Granulocytes: 7 %
Lymphocytes Relative: 0 %
Lymphs Abs: 0.3 10*3/uL — ABNORMAL LOW (ref 0.7–4.0)
MCH: 26.5 pg (ref 26.0–34.0)
MCHC: 31.8 g/dL (ref 30.0–36.0)
MCV: 83.4 fL (ref 80.0–100.0)
Monocytes Absolute: 0.5 10*3/uL (ref 0.1–1.0)
Monocytes Relative: 1 %
Neutro Abs: 83.5 10*3/uL — ABNORMAL HIGH (ref 1.7–7.7)
Neutrophils Relative %: 92 %
Platelets: 169 10*3/uL (ref 150–400)
RBC: 3.43 MIL/uL — ABNORMAL LOW (ref 3.87–5.11)
RDW: 22.5 % — ABNORMAL HIGH (ref 11.5–15.5)
WBC: 90.4 10*3/uL (ref 4.0–10.5)
nRBC: 0.1 % (ref 0.0–0.2)

## 2018-09-22 LAB — CMP (CANCER CENTER ONLY)
ALT: 35 U/L (ref 0–44)
AST: 27 U/L (ref 15–41)
Albumin: 3.4 g/dL — ABNORMAL LOW (ref 3.5–5.0)
Alkaline Phosphatase: 141 U/L — ABNORMAL HIGH (ref 38–126)
Anion gap: 8 (ref 5–15)
BUN: 15 mg/dL (ref 6–20)
CO2: 25 mmol/L (ref 22–32)
Calcium: 8.7 mg/dL — ABNORMAL LOW (ref 8.9–10.3)
Chloride: 108 mmol/L (ref 98–111)
Creatinine: 0.73 mg/dL (ref 0.44–1.00)
GFR, Est AFR Am: >60 mL/min (ref >60)
Glucose, Bld: 257 mg/dL — ABNORMAL HIGH (ref 70–99)
Potassium: 4.1 mmol/L (ref 3.5–5.1)
Sodium: 141 mmol/L (ref 135–145)
Total Bilirubin: 0.2 mg/dL — ABNORMAL LOW (ref 0.3–1.2)
Total Protein: 6.7 g/dL (ref 6.5–8.1)

## 2018-09-22 MED ORDER — SODIUM CHLORIDE 0.9% FLUSH
10.0000 mL | INTRAVENOUS | Status: DC | PRN
  Administered 2018-09-22: 10 mL
  Filled 2018-09-22: qty 10

## 2018-09-22 MED ORDER — HEPARIN SOD (PORK) LOCK FLUSH 100 UNIT/ML IV SOLN
500.0000 [IU] | Freq: Once | INTRAVENOUS | Status: AC | PRN
  Administered 2018-09-22: 500 [IU]
  Filled 2018-09-22: qty 5

## 2018-09-27 ENCOUNTER — Inpatient Hospital Stay: Payer: Medicaid Other

## 2018-09-27 ENCOUNTER — Inpatient Hospital Stay: Payer: Medicaid Other | Attending: Hematology

## 2018-09-27 DIAGNOSIS — Z95828 Presence of other vascular implants and grafts: Secondary | ICD-10-CM

## 2018-09-27 DIAGNOSIS — C8198 Hodgkin lymphoma, unspecified, lymph nodes of multiple sites: Secondary | ICD-10-CM | POA: Insufficient documentation

## 2018-09-27 DIAGNOSIS — Z5111 Encounter for antineoplastic chemotherapy: Secondary | ICD-10-CM | POA: Diagnosis not present

## 2018-09-27 DIAGNOSIS — E538 Deficiency of other specified B group vitamins: Secondary | ICD-10-CM | POA: Diagnosis not present

## 2018-09-27 DIAGNOSIS — I871 Compression of vein: Secondary | ICD-10-CM | POA: Insufficient documentation

## 2018-09-27 DIAGNOSIS — Z7901 Long term (current) use of anticoagulants: Secondary | ICD-10-CM | POA: Diagnosis not present

## 2018-09-27 DIAGNOSIS — D649 Anemia, unspecified: Secondary | ICD-10-CM | POA: Diagnosis not present

## 2018-09-27 DIAGNOSIS — Z79899 Other long term (current) drug therapy: Secondary | ICD-10-CM | POA: Diagnosis not present

## 2018-09-27 DIAGNOSIS — Z5112 Encounter for antineoplastic immunotherapy: Secondary | ICD-10-CM | POA: Insufficient documentation

## 2018-09-27 LAB — CBC WITH DIFFERENTIAL/PLATELET
ABS IMMATURE GRANULOCYTES: 0.09 10*3/uL — AB (ref 0.00–0.07)
Basophils Absolute: 0 10*3/uL (ref 0.0–0.1)
Basophils Relative: 0 %
EOS PCT: 1 %
Eosinophils Absolute: 0 10*3/uL (ref 0.0–0.5)
HEMATOCRIT: 30.5 % — AB (ref 36.0–46.0)
Hemoglobin: 9.5 g/dL — ABNORMAL LOW (ref 12.0–15.0)
Immature Granulocytes: 1 %
LYMPHS ABS: 0.6 10*3/uL — AB (ref 0.7–4.0)
LYMPHS PCT: 9 %
MCH: 25.6 pg — ABNORMAL LOW (ref 26.0–34.0)
MCHC: 31.1 g/dL (ref 30.0–36.0)
MCV: 82.2 fL (ref 80.0–100.0)
MONO ABS: 0.3 10*3/uL (ref 0.1–1.0)
MONOS PCT: 5 %
NEUTROS ABS: 5.5 10*3/uL (ref 1.7–7.7)
Neutrophils Relative %: 84 %
PLATELETS: 296 10*3/uL (ref 150–400)
RBC: 3.71 MIL/uL — ABNORMAL LOW (ref 3.87–5.11)
RDW: 21.9 % — ABNORMAL HIGH (ref 11.5–15.5)
WBC: 6.6 10*3/uL (ref 4.0–10.5)
nRBC: 0 % (ref 0.0–0.2)

## 2018-09-27 LAB — CMP (CANCER CENTER ONLY)
ALT: 26 U/L (ref 0–44)
AST: 13 U/L — ABNORMAL LOW (ref 15–41)
Albumin: 3.5 g/dL (ref 3.5–5.0)
Alkaline Phosphatase: 122 U/L (ref 38–126)
Anion gap: 10 (ref 5–15)
BUN: 13 mg/dL (ref 6–20)
CHLORIDE: 102 mmol/L (ref 98–111)
CO2: 26 mmol/L (ref 22–32)
CREATININE: 0.66 mg/dL (ref 0.44–1.00)
Calcium: 8.7 mg/dL — ABNORMAL LOW (ref 8.9–10.3)
GFR, Est AFR Am: >60 mL/min (ref >60)
GFR, Estimated: >60 mL/min (ref >60)
GLUCOSE: 106 mg/dL — AB (ref 70–99)
POTASSIUM: 3.5 mmol/L (ref 3.5–5.1)
Sodium: 138 mmol/L (ref 135–145)
Total Bilirubin: 0.3 mg/dL (ref 0.3–1.2)
Total Protein: 6.6 g/dL (ref 6.5–8.1)

## 2018-09-27 MED ORDER — ALTEPLASE 2 MG IJ SOLR
2.0000 mg | Freq: Once | INTRAMUSCULAR | Status: DC | PRN
  Filled 2018-09-27: qty 2

## 2018-09-27 MED ORDER — HEPARIN SOD (PORK) LOCK FLUSH 100 UNIT/ML IV SOLN
500.0000 [IU] | Freq: Once | INTRAVENOUS | Status: AC | PRN
  Administered 2018-09-27: 500 [IU]
  Filled 2018-09-27: qty 5

## 2018-09-27 MED ORDER — SODIUM CHLORIDE 0.9% FLUSH
10.0000 mL | INTRAVENOUS | Status: DC | PRN
  Administered 2018-09-27: 10 mL
  Filled 2018-09-27: qty 10

## 2018-09-29 ENCOUNTER — Inpatient Hospital Stay: Payer: Medicaid Other

## 2018-09-29 DIAGNOSIS — Z95828 Presence of other vascular implants and grafts: Secondary | ICD-10-CM

## 2018-09-29 DIAGNOSIS — C8198 Hodgkin lymphoma, unspecified, lymph nodes of multiple sites: Secondary | ICD-10-CM

## 2018-09-29 DIAGNOSIS — Z5112 Encounter for antineoplastic immunotherapy: Secondary | ICD-10-CM | POA: Diagnosis not present

## 2018-09-29 LAB — CMP (CANCER CENTER ONLY)
ALK PHOS: 125 U/L (ref 38–126)
ALT: 36 U/L (ref 0–44)
ANION GAP: 9 (ref 5–15)
AST: 21 U/L (ref 15–41)
Albumin: 3.6 g/dL (ref 3.5–5.0)
BUN: 13 mg/dL (ref 6–20)
CALCIUM: 9.7 mg/dL (ref 8.9–10.3)
CO2: 28 mmol/L (ref 22–32)
CREATININE: 0.73 mg/dL (ref 0.44–1.00)
Chloride: 102 mmol/L (ref 98–111)
Glucose, Bld: 154 mg/dL — ABNORMAL HIGH (ref 70–99)
Potassium: 3.6 mmol/L (ref 3.5–5.1)
SODIUM: 139 mmol/L (ref 135–145)
TOTAL PROTEIN: 7 g/dL (ref 6.5–8.1)

## 2018-09-29 LAB — CBC WITH DIFFERENTIAL/PLATELET
Abs Immature Granulocytes: 1.93 10*3/uL — ABNORMAL HIGH (ref 0.00–0.07)
BASOS PCT: 0 %
Basophils Absolute: 0 10*3/uL (ref 0.0–0.1)
Eosinophils Absolute: 0.1 10*3/uL (ref 0.0–0.5)
Eosinophils Relative: 0 %
HCT: 31.7 % — ABNORMAL LOW (ref 36.0–46.0)
Hemoglobin: 10.1 g/dL — ABNORMAL LOW (ref 12.0–15.0)
IMMATURE GRANULOCYTES: 14 %
Lymphocytes Relative: 7 %
Lymphs Abs: 1 10*3/uL (ref 0.7–4.0)
MCH: 26.5 pg (ref 26.0–34.0)
MCHC: 31.9 g/dL (ref 30.0–36.0)
MCV: 83.2 fL (ref 80.0–100.0)
Monocytes Absolute: 2 10*3/uL — ABNORMAL HIGH (ref 0.1–1.0)
Monocytes Relative: 15 %
NEUTROS ABS: 8.7 10*3/uL — AB (ref 1.7–7.7)
Neutrophils Relative %: 64 %
Platelets: 356 10*3/uL (ref 150–400)
RBC: 3.81 MIL/uL — ABNORMAL LOW (ref 3.87–5.11)
RDW: 23.2 % — ABNORMAL HIGH (ref 11.5–15.5)
WBC: 13.7 10*3/uL — ABNORMAL HIGH (ref 4.0–10.5)
nRBC: 1 % — ABNORMAL HIGH (ref 0.0–0.2)

## 2018-09-29 MED ORDER — HEPARIN SOD (PORK) LOCK FLUSH 100 UNIT/ML IV SOLN
500.0000 [IU] | Freq: Once | INTRAVENOUS | Status: AC | PRN
  Administered 2018-09-29: 500 [IU]
  Filled 2018-09-29: qty 5

## 2018-09-29 MED ORDER — SODIUM CHLORIDE 0.9% FLUSH
10.0000 mL | INTRAVENOUS | Status: DC | PRN
  Administered 2018-09-29: 10 mL
  Filled 2018-09-29: qty 10

## 2018-10-01 NOTE — Progress Notes (Signed)
HEMATOLOGY/ONCOLOGY CONSULTATION NOTE  Date of Service: 10/04/2018  Patient Care Team: Patient, No Pcp Per as PCP - General (General Practice)  CHIEF COMPLAINTS/PURPOSE OF CONSULTATION:  F/u for Mx of Hodgkins lymphoma  HISTORY OF PRESENTING ILLNESS:   Cassandra Clark is a wonderful 37 y.o. female who has been referred to [place] by Dr. 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   Patient has a mild anemia and presented to the emergency room with new onset fevers chills and a 2-week history of worsening night sweats dry cough fatigue and anorexia.  Patient notes that she initially started feeling unwell from late June when she had noted increasing fatigue that required her to rest for longer periods of time.  She also noted that she at times felt lightheaded and dizzy when she bent forward.  She notes a decreased appetite.  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 She notes that she has not been able to do much of anything else other than rest at home and work to some degree at her job.  No chest pain no palpitations no diaphoresis no lower extremity edema.  No abdominal pain no nausea no vomiting no diarrhea.  No rectal bleeding. Notes that her periods have been regular.  In the emergency room the patient was noted to have a fever of 102.4 F and tachycardia related to this.  Blood counts showed that the patient was anemic with a hemoglobin around 8 with normal platelets and a WBC count of 3.6k.  Magnesium 1.9 and phosphorus of 2. She was also noted to be vitamin B12 deficient.  Most recent lab results (08/04/18) of CBC w/diff and CMP is as follows: all values are WNL except for WBC at 3.0k RBC at 3.64, HGB at 8.3, HCT at 82.7, MCV at 78.8, MCH at 22.8, MCHC at 28.9, RDW at 18.1, PLT at 459k, Lymphs abs at 300,  Calcium at 8.0, Albumin at 2.7. 08/04/18 LDH at 203 08/04/18 Vitamin B12 at 135  X-ray of the chest done on 08/03/2018 showed Right paratracheal and left AP window masses likely representing lymphadenopathy. Consider CT chest for further characterization.  CT of the chest with contrast was subsequently done which showed 1. 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 In addition there are multiple nodular lesions in the spleen and possibly in the liver, as well as permeative destruction of the sternal manubrium with tumor rind anterior and posterior to the sternum. 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 Left-sided small cell lung cancer might give a similar appearance. Aggressive germ-cell tumor or thymic carcinoma or less likely differential diagnostic considerations. Tissue diagnosis recommended. 2. The anterior mediastinal mass is causing narrowing of the SVC, and mild invasion of the SVC with slight marginal thrombosis is not readily excluded. No large pulmonary embolus is identified.  Patient had additional lab testing which showed a near normal LDH was 203. Sedimentation rate was done subsequently and was noted to be elevated.  Urine pregnancy test was within normal limits.  Patient was noted to have significant headaches over the last 4 to 6 weeks and had a CT of the head without contrast in the ED on 07/04/2018 which showed  no acute intracranial normalities.  On review of systems, pt reports fevers, unquantified weight loss, shortness of breath, night sweats and denies overt chest pain, rashes.   Interval History:   Cassandra Clark returns today for management, evaluation and C3D1 of her A-AVD treatment of her Hodgkin's Lymphoma. The patient's last visit with [place] was on 09/20/18. She is accompanied today by a friend. The pt  reports that she is doing well overall.   The pt reports that she developed a mouth sore on the right side of her mouth a few days ago, and has not been using the salt and baking soda mouthwashes as previously recommended. She notes that this was the first mouth sore she has developed and that it resolved on its own.   The pt notes that she is no longer having drenching night sweats. She denies any fevers over 100.4.  The pt denies any peripheral neuropathy.   She has continued to have bone pains about a week after receiving her G-CSF support injections.   Lab results today (10/04/18) of CBC w/diff and CMP is as follows: all values are WNL except for WBC at 16.1k, RBC at 3.67, HGB at 9.6, HCT at 31.4, RDW at 24.4, ANC at 12.9k, Abs immature granulocytes at 1.11k, Glucose at 142, Albumin at 3.4, Total Bilirubin at 0.2.  On review of systems, pt reports resolved mouth sore, good energy levels, moving her bowels well, eating well, and denies fevers, night sweats, peripheral neuropathy, concerns for infections, abdominal pains, leg swelling, and any other symptoms.   MEDICAL HISTORY:  Past Medical History:  Diagnosis Date  . Anemia     SURGICAL HISTORY: Past Surgical History:  Procedure Laterality Date  . PICC LINE INSERTION Right 08/18/2018    SOCIAL HISTORY: Social History   Socioeconomic History  . Marital status: Legally Separated    Spouse name: Not on file  . Number of children: Not on file  . Years of education: Not on file  . Highest education level: Not on file  Occupational History  . Not on file  Social Needs  . Financial resource strain: Not on file  . Food insecurity:    Worry: Not on file    Inability: Not on file  . Transportation needs:    Medical: Not on file    Non-medical: Not on file  Tobacco Use  . Smoking status: Never Smoker  . Smokeless tobacco: Never Used  Substance and Sexual Activity  . Alcohol use: No  . Drug use: No    Comment: Drinks tea  .  Sexual activity: Not on file  Lifestyle  . Physical activity:    Days per week: Not on file    Minutes per session: Not on file  . Stress: Not on file  Relationships  . Social connections:    Talks on phone: Not on file    Gets together: Not on file    Attends religious service: Not on file    Active member of club or organization: Not on file    Attends meetings of clubs or organizations: Not on file    Relationship status: Not on file  . Intimate partner violence:    Fear of current or ex partner: Not on file    Emotionally abused: Not on file    Physically abused: Not on file    Forced sexual activity: Not on file  Other Topics Concern  . Not on file  Social History Narrative  . Not  on file    FAMILY HISTORY: Family History  Problem Relation Age of Onset  . Diabetes Mellitus II Father   . Hypertension Father   . Congestive Heart Failure Maternal Grandmother   . Diabetes Mellitus II Maternal Grandmother   . Diabetes Mellitus II Maternal Uncle   . Congestive Heart Failure Maternal Uncle     ALLERGIES:  is allergic to penicillins.  MEDICATIONS:  Current Outpatient Medications  Medication Sig Dispense Refill  . B Complex-C (B-COMPLEX WITH VITAMIN C) tablet Take 1 tablet by mouth daily. 30 tablet 3  . Cyanocobalamin (B-12) 1000 MCG SUBL Place 2,000 mcg under the tongue daily. 60 each 3  . dexamethasone (DECADRON) 4 MG tablet Take 2 tablets by mouth once a day on the day after chemotherapy and then take 2 tablets two times a day for 2 days. Take with food. 30 tablet 1  . ferrous sulfate 325 (65 FE) MG tablet Take 1 tablet (325 mg total) by mouth daily. (Patient not taking: Reported on 08/23/2018) 14 tablet 0  . HYDROcodone-acetaminophen (NORCO) 5-325 MG tablet Take 1-2 tablets by mouth every 6 (six) hours as needed for moderate pain or severe pain. 30 tablet 0  . lidocaine-prilocaine (EMLA) cream Apply to affected area once 30 g 3  . loratadine (CLARITIN) 10 MG tablet Take  10 mg by mouth daily.    Marland Kitchen LORazepam (ATIVAN) 0.5 MG tablet Take 1 tablet (0.5 mg total) by mouth every 6 (six) hours as needed (Nausea or vomiting). 30 tablet 0  . ondansetron (ZOFRAN) 8 MG tablet Take 1 tablet (8 mg total) by mouth 2 (two) times daily as needed. Start on the third day after chemotherapy. 30 tablet 1  . prochlorperazine (COMPAZINE) 10 MG tablet Take 1 tablet (10 mg total) by mouth every 6 (six) hours as needed (Nausea or vomiting). 30 tablet 1  . rivaroxaban (XARELTO) 10 MG TABS tablet Take 1 tablet (10 mg total) by mouth daily. 30 tablet 1   No current facility-administered medications for this visit.    Facility-Administered Medications Ordered in Other Visits  Medication Dose Route Frequency Provider Last Rate Last Dose  . brentuximab vedotin (ADCETRIS) 85 mg in sodium chloride 0.9 % 100 mL chemo infusion  1.2 mg/kg (Treatment Plan Recorded) Intravenous Once Brunetta Genera, MD      . dacarbazine (DTIC) 690 mg in sodium chloride 0.9 % 250 mL chemo infusion  375 mg/m2 (Treatment Plan Recorded) Intravenous Once Brunetta Genera, MD      . DOXOrubicin (ADRIAMYCIN) chemo injection 46 mg  25 mg/m2 (Treatment Plan Recorded) Intravenous Once Brunetta Genera, MD      . fosaprepitant (EMEND) 150 mg, dexamethasone (DECADRON) 12 mg in sodium chloride 0.9 % 145 mL IVPB   Intravenous Once Brunetta Genera, MD      . heparin lock flush 100 unit/mL  250 Units Intracatheter Once PRN Brunetta Genera, MD      . palonosetron (ALOXI) injection 0.25 mg  0.25 mg Intravenous Once Brunetta Genera, MD      . sodium chloride flush (NS) 0.9 % injection 10 mL  10 mL Intracatheter PRN Brunetta Genera, MD      . vinBLAStine (VELBAN) 11 mg in sodium chloride 0.9 % 50 mL chemo infusion  6 mg/m2 (Treatment Plan Recorded) Intravenous Once Brunetta Genera, MD        REVIEW OF SYSTEMS:    A 10+ POINT REVIEW OF SYSTEMS WAS OBTAINED including neurology,  dermatology,  psychiatry, cardiac, respiratory, lymph, extremities, GI, GU, Musculoskeletal, constitutional, breasts, reproductive, HEENT.  All pertinent positives are noted in the HPI.  All others are negative.   PHYSICAL EXAMINATION: ECOG PERFORMANCE STATUS: 1 - Symptomatic but completely ambulatory  . There were no vitals filed for this visit. There were no vitals filed for this visit. .There is no height or weight on file to calculate BMI.   GENERAL:alert, in no acute distress and comfortable SKIN: no acute rashes, no significant lesions EYES: conjunctiva are pink and non-injected, sclera anicteric OROPHARYNX: MMM, no exudates, no oropharyngeal erythema or ulceration NECK: supple, no JVD LYMPH:  B/l supraclavicular and left axillary lymphadenopathy palpable  LUNGS: clear to auscultation b/l with normal respiratory effort HEART: regular rate & rhythm ABDOMEN:  normoactive bowel sounds , non tender, not distended. No palpable hepatosplenomegaly.  Extremity: trace pedal edema PSYCH: alert & oriented x 3 with fluent speech NEURO: no focal motor/sensory deficits   LABORATORY DATA:  I have reviewed the data as listed  . CBC Latest Ref Rng & Units 10/04/2018 09/29/2018 09/27/2018  WBC 4.0 - 10.5 K/uL 16.1(H) 13.7(H) 6.6  Hemoglobin 12.0 - 15.0 g/dL 9.6(L) 10.1(L) 9.5(L)  Hematocrit 36.0 - 46.0 % 31.4(L) 31.7(L) 30.5(L)  Platelets 150 - 400 K/uL 218 356 296    . CMP Latest Ref Rng & Units 10/04/2018 09/29/2018 09/27/2018  Glucose 70 - 99 mg/dL 142(H) 154(H) 106(H)  BUN 6 - 20 mg/dL 12 13 13   Creatinine 0.44 - 1.00 mg/dL 0.78 0.73 0.66  Sodium 135 - 145 mmol/L 139 139 138  Potassium 3.5 - 5.1 mmol/L 4.1 3.6 3.5  Chloride 98 - 111 mmol/L 103 102 102  CO2 22 - 32 mmol/L 25 28 26   Calcium 8.9 - 10.3 mg/dL 9.0 9.7 8.7(L)  Total Protein 6.5 - 8.1 g/dL 6.9 7.0 6.6  Total Bilirubin 0.3 - 1.2 mg/dL 0.2(L) <0.2(L) 0.3  Alkaline Phos 38 - 126 U/L 124 125 122  AST 15 - 41 U/L 18 21 13(L)  ALT 0 - 44 U/L  28 36 26   Component     Latest Ref Rng & Units 08/04/2018  Total Protein ELP     6.0 - 8.5 g/dL 6.2  Albumin ELP     2.9 - 4.4 g/dL 2.6 (L)  Alpha-1-Globulin     0.0 - 0.4 g/dL 0.3  Alpha-2-Globulin     0.4 - 1.0 g/dL 0.9  Beta Globulin     0.7 - 1.3 g/dL 1.0  Gamma Globulin     0.4 - 1.8 g/dL 1.4  M-SPIKE, %     Not Observed g/dL Not Observed  SPE Interp.      Comment  Comment      Comment  Globulin, Total     2.2 - 3.9 g/dL 3.6  A/G Ratio     0.7 - 1.7 0.7  Iron     28 - 170 ug/dL 23 (L)  TIBC     250 - 450 ug/dL 196 (L)  Saturation Ratios     10.4 - 31.8 % 12  UIBC     ug/dL 173  Retic Ct Pct     0.4 - 3.1 % 2.0  RBC.     3.87 - 5.11 MIL/uL 3.42 (L)  Retic Count, Absolute     19.0 - 186.0 K/uL 69.8  Immature Retic Fract     2.3 - 15.9 % 29.4 (H)  Hepatitis B Surface Ag     Negative Negative  HCV Ab     0.0 - 0.9 s/co ratio 0.1  Hep A Ab, IgM     Negative Negative  Hep B Core Ab, IgM     Negative Negative  Vitamin B12     180 - 914 pg/mL 135 (L)  Folate     >5.9 ng/mL 11.5  Ferritin     11 - 307 ng/mL 91  Uric Acid, Serum     2.5 - 7.1 mg/dL 4.5  LDH     98 - 192 U/L 203 (H)  AFP, Serum, Tumor Marker     0.0 - 8.3 ng/mL 3.4  CEA     0.0 - 4.7 ng/mL 1.4  Beta-2 Microglobulin     0.6 - 2.4 mg/L 2.4  Magnesium     1.7 - 2.4 mg/dL 2.0  Phosphorus     2.5 - 4.6 mg/dL 2.7    08/04/18 Left Cervical Core Biopsy:   08/06/18 BM Bx:    RADIOGRAPHIC STUDIES: I have personally reviewed the radiological images as listed and agreed with the findings in the report. No results found.  ASSESSMENT & PLAN:   37 y.o. 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 LDH - no significantly elevated. Sed rate elevated at 62 Discussed supraclavicular LN bx with pathology -  Prelim -consistent with Hodgkins lymphoma. Stage IVB with liver involvement. With cytopenias concern for BM involvement with Hodgkins lymphoma  08/05/18 ECHO revealed LV EF of 65-70%. No pericardial effusion.   08/06/18 BM Bx revealed involvement by Hodgkin's lymphoma  08/13/18 PET/CT revealed Large hypermetabolic mass within the anterior mediastinum identified compatible with lymphoma, Deauville criteria 5. There is extensive hypermetabolic left supraclavicular, left axillary, left hilar and right iliac adenopathy, Deauville criteria 4 and 5. Additional lesions are identified within the caudate lobe of liver and spleen, Deauville criteria 4. Multifocal hypermetabolic bone lesions are also identified, Deauville criteria 5   2. 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 SVC compression due to anterior mediastinal mass - no upper extremity , neck or face swelling or change in visiion to suggest overt SVC syndrome  4. Microcytic anemia - normal iron. Likely anemia or chronic disease  5. B12 deficiency ? Pernicious anemia  PLAN:  -Discussed pt labwork today, 10/04/18; blood counts and chemistries are stable  -The pt has no prohibitive toxicities from continuing C3 AAVD at this time.   -Recommended salt and baking mouthwashes 4-5 times a day -Will repeat PET/CT after the completion of C3 -Recommended that the pt continue to eat well, drink at least 48-64 oz of water each day, and walk 20-30 minutes each day.   -The pt does not prefer any fertility preservation strategies- declined lupron. -Pt began C1 A-AVD outpatient on Monday 08/09/18 -Began preventative low dose Xarelto 10mg  po daily at discharge for DVT prophylaxis in the setting of SVC narrowing. 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 The patient knows to call the clinic with any problems, questions or concerns.  The total time spent in the appt was 25 minutes and more than 50% was on counseling and direct patient cares.    Sullivan Lone MD MS AAHIVMS [facility] Hematology/Oncology Physician [facility]  (Office):       [phone] (Work cell):  [phone] (Fax):           [phone]  10/04/2018 10:28 AM  I, Baldwin Jamaica, am acting as a scribe for Dr. Sullivan Lone.   .I have reviewed the above documentation for accuracy and completeness, and I agree with the above. Brunetta Genera MD

## 2018-10-04 ENCOUNTER — Inpatient Hospital Stay: Payer: Medicaid Other

## 2018-10-04 ENCOUNTER — Other Ambulatory Visit: Payer: Self-pay

## 2018-10-04 ENCOUNTER — Inpatient Hospital Stay (HOSPITAL_BASED_OUTPATIENT_CLINIC_OR_DEPARTMENT_OTHER): Payer: Medicaid Other | Admitting: Hematology

## 2018-10-04 VITALS — BP 123/67 | HR 123 | Temp 99.2°F | Resp 18

## 2018-10-04 DIAGNOSIS — D649 Anemia, unspecified: Secondary | ICD-10-CM | POA: Diagnosis not present

## 2018-10-04 DIAGNOSIS — I871 Compression of vein: Secondary | ICD-10-CM

## 2018-10-04 DIAGNOSIS — C8198 Hodgkin lymphoma, unspecified, lymph nodes of multiple sites: Secondary | ICD-10-CM | POA: Diagnosis not present

## 2018-10-04 DIAGNOSIS — Z95828 Presence of other vascular implants and grafts: Secondary | ICD-10-CM

## 2018-10-04 DIAGNOSIS — E538 Deficiency of other specified B group vitamins: Secondary | ICD-10-CM | POA: Diagnosis not present

## 2018-10-04 DIAGNOSIS — Z79899 Other long term (current) drug therapy: Secondary | ICD-10-CM

## 2018-10-04 DIAGNOSIS — Z7189 Other specified counseling: Secondary | ICD-10-CM

## 2018-10-04 DIAGNOSIS — Z5112 Encounter for antineoplastic immunotherapy: Secondary | ICD-10-CM | POA: Diagnosis not present

## 2018-10-04 DIAGNOSIS — Z7901 Long term (current) use of anticoagulants: Secondary | ICD-10-CM

## 2018-10-04 DIAGNOSIS — Z5111 Encounter for antineoplastic chemotherapy: Secondary | ICD-10-CM

## 2018-10-04 LAB — CMP (CANCER CENTER ONLY)
ALT: 28 U/L (ref 0–44)
AST: 18 U/L (ref 15–41)
Albumin: 3.4 g/dL — ABNORMAL LOW (ref 3.5–5.0)
Alkaline Phosphatase: 124 U/L (ref 38–126)
Anion gap: 11 (ref 5–15)
BUN: 12 mg/dL (ref 6–20)
CHLORIDE: 103 mmol/L (ref 98–111)
CO2: 25 mmol/L (ref 22–32)
Calcium: 9 mg/dL (ref 8.9–10.3)
Creatinine: 0.78 mg/dL (ref 0.44–1.00)
GFR, Est AFR Am: >60 mL/min (ref >60)
GFR, Estimated: >60 mL/min (ref >60)
Glucose, Bld: 142 mg/dL — ABNORMAL HIGH (ref 70–99)
Potassium: 4.1 mmol/L (ref 3.5–5.1)
Sodium: 139 mmol/L (ref 135–145)
Total Bilirubin: 0.2 mg/dL — ABNORMAL LOW (ref 0.3–1.2)
Total Protein: 6.9 g/dL (ref 6.5–8.1)

## 2018-10-04 LAB — CBC WITH DIFFERENTIAL/PLATELET
Abs Immature Granulocytes: 1.11 10*3/uL — ABNORMAL HIGH (ref 0.00–0.07)
BASOS ABS: 0.1 10*3/uL (ref 0.0–0.1)
Basophils Relative: 1 %
Eosinophils Absolute: 0.1 10*3/uL (ref 0.0–0.5)
Eosinophils Relative: 1 %
HCT: 31.4 % — ABNORMAL LOW (ref 36.0–46.0)
Hemoglobin: 9.6 g/dL — ABNORMAL LOW (ref 12.0–15.0)
Immature Granulocytes: 7 %
LYMPHS ABS: 0.8 10*3/uL (ref 0.7–4.0)
LYMPHS PCT: 5 %
MCH: 26.2 pg (ref 26.0–34.0)
MCHC: 30.6 g/dL (ref 30.0–36.0)
MCV: 85.6 fL (ref 80.0–100.0)
Monocytes Absolute: 1 10*3/uL (ref 0.1–1.0)
Monocytes Relative: 7 %
Neutro Abs: 12.9 10*3/uL — ABNORMAL HIGH (ref 1.7–7.7)
Neutrophils Relative %: 79 %
PLATELETS: 218 10*3/uL (ref 150–400)
RBC: 3.67 MIL/uL — ABNORMAL LOW (ref 3.87–5.11)
RDW: 24.4 % — ABNORMAL HIGH (ref 11.5–15.5)
WBC: 16.1 10*3/uL — ABNORMAL HIGH (ref 4.0–10.5)
nRBC: 0.1 % (ref 0.0–0.2)

## 2018-10-04 MED ORDER — PALONOSETRON HCL INJECTION 0.25 MG/5ML
INTRAVENOUS | Status: AC
  Filled 2018-10-04: qty 5

## 2018-10-04 MED ORDER — DIPHENHYDRAMINE HCL 25 MG PO TABS
25.0000 mg | ORAL_TABLET | Freq: Once | ORAL | Status: AC
  Administered 2018-10-04: 25 mg via ORAL
  Filled 2018-10-04: qty 1

## 2018-10-04 MED ORDER — PALONOSETRON HCL INJECTION 0.25 MG/5ML
0.2500 mg | Freq: Once | INTRAVENOUS | Status: AC
  Administered 2018-10-04: 0.25 mg via INTRAVENOUS

## 2018-10-04 MED ORDER — SODIUM CHLORIDE 0.9 % IV SOLN
Freq: Once | INTRAVENOUS | Status: AC
  Administered 2018-10-04: 10:00:00 via INTRAVENOUS
  Filled 2018-10-04: qty 250

## 2018-10-04 MED ORDER — SODIUM CHLORIDE 0.9% FLUSH
10.0000 mL | INTRAVENOUS | Status: DC | PRN
  Administered 2018-10-04: 10 mL
  Filled 2018-10-04: qty 10

## 2018-10-04 MED ORDER — SODIUM CHLORIDE 0.9 % IV SOLN
1.2000 mg/kg | Freq: Once | INTRAVENOUS | Status: AC
  Administered 2018-10-04: 85 mg via INTRAVENOUS
  Filled 2018-10-04: qty 17

## 2018-10-04 MED ORDER — SODIUM CHLORIDE 0.9 % IV SOLN
375.0000 mg/m2 | Freq: Once | INTRAVENOUS | Status: AC
  Administered 2018-10-04: 690 mg via INTRAVENOUS
  Filled 2018-10-04: qty 69

## 2018-10-04 MED ORDER — SODIUM CHLORIDE 0.9 % IV SOLN
Freq: Once | INTRAVENOUS | Status: AC
  Administered 2018-10-04: 11:00:00 via INTRAVENOUS
  Filled 2018-10-04: qty 5

## 2018-10-04 MED ORDER — HEPARIN SOD (PORK) LOCK FLUSH 100 UNIT/ML IV SOLN
250.0000 [IU] | Freq: Once | INTRAVENOUS | Status: AC | PRN
  Administered 2018-10-04: 250 [IU]
  Filled 2018-10-04: qty 5

## 2018-10-04 MED ORDER — ACETAMINOPHEN 325 MG PO TABS
650.0000 mg | ORAL_TABLET | Freq: Once | ORAL | Status: AC
  Administered 2018-10-04: 650 mg via ORAL

## 2018-10-04 MED ORDER — VINBLASTINE SULFATE CHEMO INJECTION 1 MG/ML
6.0000 mg/m2 | Freq: Once | INTRAVENOUS | Status: AC
  Administered 2018-10-04: 11 mg via INTRAVENOUS
  Filled 2018-10-04: qty 11

## 2018-10-04 MED ORDER — DIPHENHYDRAMINE HCL 25 MG PO CAPS
ORAL_CAPSULE | ORAL | Status: AC
  Filled 2018-10-04: qty 1

## 2018-10-04 MED ORDER — ACETAMINOPHEN 325 MG PO TABS
ORAL_TABLET | ORAL | Status: AC
  Filled 2018-10-04: qty 2

## 2018-10-04 MED ORDER — DOXORUBICIN HCL CHEMO IV INJECTION 2 MG/ML
25.0000 mg/m2 | Freq: Once | INTRAVENOUS | Status: AC
  Administered 2018-10-04: 46 mg via INTRAVENOUS
  Filled 2018-10-04: qty 23

## 2018-10-04 NOTE — Patient Instructions (Signed)
PICC Home Guide °A peripherally inserted central catheter (PICC) is a long, thin, flexible tube that is inserted into a vein in the upper arm. It is a form of intravenous (IV) access. It is considered to be a "central" line because the tip of the PICC ends in a large vein in your chest. This large vein is called the superior vena cava (SVC). The PICC tip ends in the SVC because there is a lot of blood flow in the SVC. This allows medicines and IV fluids to be quickly distributed throughout the body. The PICC is inserted using a sterile technique by a specially trained nurse or physician. After the PICC is inserted, a chest X-ray exam is done to be sure it is in the correct place. °A PICC may be placed for different reasons, such as: °· To give medicines and liquid nutrition that can only be given through a central line. Examples are: °? Certain antibiotic treatments. °? Chemotherapy. °? Total parenteral nutrition (TPN). °· To take frequent blood samples. °· To give IV fluids and blood products. °· If there is difficulty placing a peripheral intravenous (PIV) catheter. ° °If taken care of properly, a PICC can remain in place for several months. A PICC can also allow a person to go home from the hospital early. Medicine and PICC care can be managed at home by a family member or home health care team. °What problems can happen when I have a PICC? °Problems with a PICC can occasionally occur. These may include the following: °· A blood clot (thrombus) forming in or at the tip of the PICC. This can cause the PICC to become clogged. A clot-dissolving medicine called tissue plasminogen activator (tPA) can be given through the PICC to help break up the clot. °· Inflammation of the vein (phlebitis) in which the PICC is placed. Signs of inflammation may include redness, pain at the insertion site, red streaks, or being able to feel a "cord" in the vein where the PICC is located. °· Infection in the PICC or at the insertion  site. Signs of infection may include fever, chills, redness, swelling, or pus drainage from the PICC insertion site. °· PICC movement (malposition). The PICC tip may move from its original position due to excessive physical activity, forceful coughing, sneezing, or vomiting. °· A break or cut in the PICC. It is important to not use scissors near the PICC. °· Nerve or tendon irritation or injury during PICC insertion. ° °What should I keep in mind about activities when I have a PICC? °· You may bend your arm and move it freely. If your PICC is near or at the bend of your elbow, avoid activity with repeated motion at the elbow. °· Rest at home for the remainder of the day following PICC line insertion. °· Avoid lifting heavy objects as instructed by your health care provider. °· Avoid using a crutch with the arm on the same side as your PICC. You may need to use a walker. °What should I know about my PICC dressing? °· Keep your PICC bandage (dressing) clean and dry to prevent infection. °? Ask your health care provider when you may shower. Ask your health care provider to teach you how to wrap the PICC when you do take a shower. °· Change the PICC dressing as instructed by your health care provider. °· Change your PICC dressing if it becomes loose or wet. °What should I know about PICC care? °· Check the PICC insertion   site daily for leakage, redness, swelling, or pain. °· Do not take a bath, swim, or use hot tubs when you have a PICC. Cover PICC line with clear plastic wrap and tape to keep it dry while showering. °· Flush the PICC as directed by your health care provider. Let your health care provider know right away if the PICC is difficult to flush or does not flush. Do not use force to flush the PICC. °· Do not use a syringe that is less than 10 mL to flush the PICC. °· Never pull or tug on the PICC. °· Avoid blood pressure checks on the arm with the PICC. °· Keep your PICC identification card with you at all  times. °· Do not take the PICC out yourself. Only a trained clinical professional should remove the PICC. °Get help right away if: °· Your PICC is accidentally pulled all the way out. If this happens, cover the insertion site with a bandage or gauze dressing. Do not throw the PICC away. Your health care provider will need to inspect it. °· Your PICC was tugged or pulled and has partially come out. Do not  push the PICC back in. °· There is any type of drainage, redness, or swelling where the PICC enters the skin. °· You cannot flush the PICC, it is difficult to flush, or the PICC leaks around the insertion site when it is flushed. °· You hear a "flushing" sound when the PICC is flushed. °· You have pain, discomfort, or numbness in your arm, shoulder, or jaw on the same side as the PICC. °· You feel your heart "racing" or skipping beats. °· You notice a hole or tear in the PICC. °· You develop chills or a fever. °This information is not intended to replace advice given to you by your health care provider. Make sure you discuss any questions you have with your health care provider. °Document Released: 04/19/2003 Document Revised: 05/02/2016 Document Reviewed: 08/05/2013 °Elsevier Interactive Patient Education © 2017 [employer] ° °

## 2018-10-04 NOTE — Patient Instructions (Signed)
[facility] Discharge Instructions for Patients Receiving Chemotherapy  Today you received the following chemotherapy agents Doxirubicin, Dacarbazine, Vinblastine and Bleomycin  To help prevent nausea and vomiting after your treatment, we encourage you to take your nausea medication as prescribed.    If you develop nausea and vomiting that is not controlled by your nausea medication, call the clinic.   BELOW ARE SYMPTOMS THAT SHOULD BE REPORTED IMMEDIATELY:  *FEVER GREATER THAN 100.5 F  *CHILLS WITH OR WITHOUT FEVER  NAUSEA AND VOMITING THAT IS NOT CONTROLLED WITH YOUR NAUSEA MEDICATION  *UNUSUAL SHORTNESS OF BREATH  *UNUSUAL BRUISING OR BLEEDING  TENDERNESS IN MOUTH AND THROAT WITH OR WITHOUT PRESENCE OF ULCERS  *URINARY PROBLEMS  *BOWEL PROBLEMS  UNUSUAL RASH Items with * indicate a potential emergency and should be followed up as soon as possible.  Feel free to call the clinic should you have any questions or concerns. The clinic phone number is [phone].  Please show the [facility] at check-in to the Emergency Department and triage nurse.

## 2018-10-04 NOTE — Progress Notes (Signed)
Dr. Irene Limbo assessed pt in infusion room. Ok to tx today with today's VS

## 2018-10-05 ENCOUNTER — Inpatient Hospital Stay: Payer: Medicaid Other

## 2018-10-05 VITALS — BP 131/77 | HR 101 | Temp 98.0°F | Resp 18

## 2018-10-05 DIAGNOSIS — C8198 Hodgkin lymphoma, unspecified, lymph nodes of multiple sites: Secondary | ICD-10-CM

## 2018-10-05 DIAGNOSIS — Z5112 Encounter for antineoplastic immunotherapy: Secondary | ICD-10-CM | POA: Diagnosis not present

## 2018-10-05 DIAGNOSIS — I871 Compression of vein: Secondary | ICD-10-CM

## 2018-10-05 DIAGNOSIS — Z7189 Other specified counseling: Secondary | ICD-10-CM

## 2018-10-05 MED ORDER — PEGFILGRASTIM-CBQV 6 MG/0.6ML ~~LOC~~ SOSY
PREFILLED_SYRINGE | SUBCUTANEOUS | Status: AC
  Filled 2018-10-05: qty 0.6

## 2018-10-05 MED ORDER — PEGFILGRASTIM-CBQV 6 MG/0.6ML ~~LOC~~ SOSY
6.0000 mg | PREFILLED_SYRINGE | Freq: Once | SUBCUTANEOUS | Status: AC
  Administered 2018-10-05: 6 mg via SUBCUTANEOUS

## 2018-10-05 NOTE — Patient Instructions (Signed)
Pegfilgrastim injection What is this medicine? PEGFILGRASTIM (PEG fil gra stim) is a long-acting granulocyte colony-stimulating factor that stimulates the growth of neutrophils, a type of white blood cell important in the body's fight against infection. It is used to reduce the incidence of fever and infection in patients with certain types of cancer who are receiving chemotherapy that affects the bone marrow, and to increase survival after being exposed to high doses of radiation. This medicine may be used for other purposes; ask your health care provider or pharmacist if you have questions. COMMON BRAND NAME(S): Neulasta What should I tell my health care provider before I take this medicine? They need to know if you have any of these conditions: -kidney disease -latex allergy -ongoing radiation therapy -sickle cell disease -skin reactions to acrylic adhesives (On-Body Injector only) -an unusual or allergic reaction to pegfilgrastim, filgrastim, other medicines, foods, dyes, or preservatives -pregnant or trying to get pregnant -breast-feeding How should I use this medicine? This medicine is for injection under the skin. If you get this medicine at home, you will be taught how to prepare and give the pre-filled syringe or how to use the On-body Injector. Refer to the patient Instructions for Use for detailed instructions. Use exactly as directed. Tell your healthcare provider immediately if you suspect that the On-body Injector may not have performed as intended or if you suspect the use of the On-body Injector resulted in a missed or partial dose. It is important that you put your used needles and syringes in a special sharps container. Do not put them in a trash can. If you do not have a sharps container, call your pharmacist or healthcare provider to get one. Talk to your pediatrician regarding the use of this medicine in children. While this drug may be prescribed for selected conditions,  precautions do apply. Overdosage: If you think you have taken too much of this medicine contact a poison control center or emergency room at once. NOTE: This medicine is only for you. Do not share this medicine with others. What if I miss a dose? It is important not to miss your dose. Call your doctor or health care professional if you miss your dose. If you miss a dose due to an On-body Injector failure or leakage, a new dose should be administered as soon as possible using a single prefilled syringe for manual use. What may interact with this medicine? Interactions have not been studied. Give your health care provider a list of all the medicines, herbs, non-prescription drugs, or dietary supplements you use. Also tell them if you smoke, drink alcohol, or use illegal drugs. Some items may interact with your medicine. This list may not describe all possible interactions. Give your health care provider a list of all the medicines, herbs, non-prescription drugs, or dietary supplements you use. Also tell them if you smoke, drink alcohol, or use illegal drugs. Some items may interact with your medicine. What should I watch for while using this medicine? You may need blood work done while you are taking this medicine. If you are going to need a MRI, CT scan, or other procedure, tell your doctor that you are using this medicine (On-Body Injector only). What side effects may I notice from receiving this medicine? Side effects that you should report to your doctor or health care professional as soon as possible: -allergic reactions like skin rash, itching or hives, swelling of the face, lips, or tongue -dizziness -fever -pain, redness, or irritation at site   where injected -pinpoint red spots on the skin -red or dark-brown urine -shortness of breath or breathing problems -stomach or side pain, or pain at the shoulder -swelling -tiredness -trouble passing urine or change in the amount of urine Side  effects that usually do not require medical attention (report to your doctor or health care professional if they continue or are bothersome): -bone pain -muscle pain This list may not describe all possible side effects. Call your doctor for medical advice about side effects. You may report side effects to FDA at 1-800-FDA-1088. Where should I keep my medicine? Keep out of the reach of children. Store pre-filled syringes in a refrigerator between 2 and 8 degrees C (36 and 46 degrees F). Do not freeze. Keep in carton to protect from light. Throw away this medicine if it is left out of the refrigerator for more than 48 hours. Throw away any unused medicine after the expiration date. NOTE: This sheet is a summary. It may not cover all possible information. If you have questions about this medicine, talk to your doctor, pharmacist, or health care provider.  2018 Elsevier/Gold Standard (2016-10-09 12:58:03)  

## 2018-10-06 ENCOUNTER — Other Ambulatory Visit: Payer: Self-pay

## 2018-10-06 ENCOUNTER — Inpatient Hospital Stay: Payer: Medicaid Other

## 2018-10-06 ENCOUNTER — Telehealth: Payer: Self-pay

## 2018-10-06 DIAGNOSIS — Z95828 Presence of other vascular implants and grafts: Secondary | ICD-10-CM

## 2018-10-06 DIAGNOSIS — Z5112 Encounter for antineoplastic immunotherapy: Secondary | ICD-10-CM | POA: Diagnosis not present

## 2018-10-06 MED ORDER — HEPARIN SOD (PORK) LOCK FLUSH 100 UNIT/ML IV SOLN
500.0000 [IU] | Freq: Once | INTRAVENOUS | Status: AC | PRN
  Administered 2018-10-06: 250 [IU]
  Filled 2018-10-06: qty 5

## 2018-10-06 MED ORDER — SODIUM CHLORIDE 0.9% FLUSH
10.0000 mL | INTRAVENOUS | Status: DC | PRN
  Administered 2018-10-06: 10 mL
  Filled 2018-10-06: qty 10

## 2018-10-06 NOTE — Telephone Encounter (Signed)
Left a detailed msg concerning additional appointments added to current schedule. Per 12/9 los

## 2018-10-12 ENCOUNTER — Other Ambulatory Visit: Payer: Self-pay | Admitting: *Deleted

## 2018-10-13 ENCOUNTER — Telehealth: Payer: Self-pay | Admitting: *Deleted

## 2018-10-13 ENCOUNTER — Telehealth: Payer: Self-pay | Admitting: Hematology

## 2018-10-13 NOTE — Telephone Encounter (Signed)
Scheduled appt per 12/18 sch message; - left message for patient with appt date and time

## 2018-10-13 NOTE — Telephone Encounter (Signed)
Patient requests appt for PICC flush this week. States they didn't make her any appointments for this week. States last flush was Wednesday 10/06/18. HP Message sent to scheduling

## 2018-10-14 ENCOUNTER — Inpatient Hospital Stay: Payer: Medicaid Other

## 2018-10-14 DIAGNOSIS — Z95828 Presence of other vascular implants and grafts: Secondary | ICD-10-CM

## 2018-10-14 DIAGNOSIS — Z5112 Encounter for antineoplastic immunotherapy: Secondary | ICD-10-CM | POA: Diagnosis not present

## 2018-10-14 MED ORDER — HEPARIN SOD (PORK) LOCK FLUSH 100 UNIT/ML IV SOLN
500.0000 [IU] | Freq: Once | INTRAVENOUS | Status: AC | PRN
  Administered 2018-10-14: 500 [IU]
  Filled 2018-10-14: qty 5

## 2018-10-14 MED ORDER — SODIUM CHLORIDE 0.9% FLUSH
10.0000 mL | INTRAVENOUS | Status: DC | PRN
  Administered 2018-10-14: 10 mL
  Filled 2018-10-14: qty 10

## 2018-10-15 MED ORDER — RIVAROXABAN 10 MG PO TABS: 10.0000 mg | ORAL_TABLET | Freq: Every day | ORAL | 1 refills | Status: DC

## 2018-10-15 MED FILL — XARELTO 10 MG TABLET: 10 | 30 days supply | Qty: 30 | Fill #0

## 2018-10-16 ENCOUNTER — Encounter (HOSPITAL_COMMUNITY): Payer: Self-pay | Admitting: *Deleted

## 2018-10-16 ENCOUNTER — Emergency Department (HOSPITAL_COMMUNITY): Payer: Medicaid Other

## 2018-10-16 ENCOUNTER — Inpatient Hospital Stay (HOSPITAL_COMMUNITY)
Admission: EM | Admit: 2018-10-16 | Discharge: 2018-10-19 | DRG: 194 | Disposition: A | Discharge status: Home / Self Care | Payer: Medicaid Other | Attending: Family Medicine | Admitting: Family Medicine

## 2018-10-16 DIAGNOSIS — R000 Tachycardia, unspecified: Secondary | ICD-10-CM | POA: Diagnosis present

## 2018-10-16 DIAGNOSIS — Y95 Nosocomial condition: Secondary | ICD-10-CM | POA: Diagnosis present

## 2018-10-16 DIAGNOSIS — D638 Anemia in other chronic diseases classified elsewhere: Secondary | ICD-10-CM | POA: Diagnosis present

## 2018-10-16 DIAGNOSIS — Z8249 Family history of ischemic heart disease and other diseases of the circulatory system: Secondary | ICD-10-CM | POA: Diagnosis not present

## 2018-10-16 DIAGNOSIS — Z7901 Long term (current) use of anticoagulants: Secondary | ICD-10-CM

## 2018-10-16 DIAGNOSIS — Z79891 Long term (current) use of opiate analgesic: Secondary | ICD-10-CM | POA: Diagnosis not present

## 2018-10-16 DIAGNOSIS — D509 Iron deficiency anemia, unspecified: Secondary | ICD-10-CM | POA: Diagnosis present

## 2018-10-16 DIAGNOSIS — Z880 Allergy status to penicillin: Secondary | ICD-10-CM

## 2018-10-16 DIAGNOSIS — Z79899 Other long term (current) drug therapy: Secondary | ICD-10-CM

## 2018-10-16 DIAGNOSIS — E538 Deficiency of other specified B group vitamins: Secondary | ICD-10-CM | POA: Diagnosis present

## 2018-10-16 DIAGNOSIS — C8190 Hodgkin lymphoma, unspecified, unspecified site: Secondary | ICD-10-CM | POA: Diagnosis present

## 2018-10-16 DIAGNOSIS — Z833 Family history of diabetes mellitus: Secondary | ICD-10-CM | POA: Diagnosis not present

## 2018-10-16 DIAGNOSIS — J189 Pneumonia, unspecified organism: Principal | ICD-10-CM | POA: Diagnosis present

## 2018-10-16 DIAGNOSIS — Z9221 Personal history of antineoplastic chemotherapy: Secondary | ICD-10-CM | POA: Diagnosis not present

## 2018-10-16 LAB — PROTIME-INR
INR: 1.31
Prothrombin Time: 16.2 seconds — ABNORMAL HIGH (ref 11.4–15.2)

## 2018-10-16 LAB — COMPREHENSIVE METABOLIC PANEL
ALT: 25 U/L (ref 0–44)
AST: 22 U/L (ref 15–41)
Albumin: 3.4 g/dL — ABNORMAL LOW (ref 3.5–5.0)
Alkaline Phosphatase: 95 U/L (ref 38–126)
Anion gap: 9 (ref 5–15)
BUN: 8 mg/dL (ref 6–20)
CO2: 26 mmol/L (ref 22–32)
Calcium: 8.8 mg/dL — ABNORMAL LOW (ref 8.9–10.3)
Chloride: 101 mmol/L (ref 98–111)
Creatinine, Ser: 0.8 mg/dL (ref 0.44–1.00)
GFR calc non Af Amer: >60 mL/min (ref >60)
Glucose, Bld: 149 mg/dL — ABNORMAL HIGH (ref 70–99)
Potassium: 3.8 mmol/L (ref 3.5–5.1)
Sodium: 136 mmol/L (ref 135–145)
Total Bilirubin: 0.6 mg/dL (ref 0.3–1.2)
Total Protein: 6.2 g/dL — ABNORMAL LOW (ref 6.5–8.1)

## 2018-10-16 LAB — URINALYSIS, ROUTINE W REFLEX MICROSCOPIC
Bilirubin Urine: NEGATIVE
Glucose, UA: NEGATIVE mg/dL
Hgb urine dipstick: NEGATIVE
Ketones, ur: NEGATIVE mg/dL
Nitrite: NEGATIVE
Protein, ur: NEGATIVE mg/dL
Specific Gravity, Urine: 1.003 — ABNORMAL LOW (ref 1.005–1.030)
pH: 7 (ref 5.0–8.0)

## 2018-10-16 LAB — CBC WITH DIFFERENTIAL/PLATELET
ABS IMMATURE GRANULOCYTES: 2.32 10*3/uL — AB (ref 0.00–0.07)
BASOS ABS: 0.1 10*3/uL (ref 0.0–0.1)
Basophils Relative: 1 %
Eosinophils Absolute: 0.1 10*3/uL (ref 0.0–0.5)
Eosinophils Relative: 1 %
HCT: 30.9 % — ABNORMAL LOW (ref 36.0–46.0)
Hemoglobin: 9.1 g/dL — ABNORMAL LOW (ref 12.0–15.0)
IMMATURE GRANULOCYTES: 11 %
LYMPHS ABS: 0.8 10*3/uL (ref 0.7–4.0)
Lymphocytes Relative: 4 %
MCH: 26.1 pg (ref 26.0–34.0)
MCHC: 29.4 g/dL — ABNORMAL LOW (ref 30.0–36.0)
MCV: 88.5 fL (ref 80.0–100.0)
Monocytes Absolute: 1.9 10*3/uL — ABNORMAL HIGH (ref 0.1–1.0)
Monocytes Relative: 9 %
NEUTROS PCT: 74 %
Neutro Abs: 16.5 10*3/uL — ABNORMAL HIGH (ref 1.7–7.7)
Platelets: 268 10*3/uL (ref 150–400)
RBC: 3.49 MIL/uL — ABNORMAL LOW (ref 3.87–5.11)
RDW: 24.3 % — ABNORMAL HIGH (ref 11.5–15.5)
WBC: 21.8 10*3/uL — ABNORMAL HIGH (ref 4.0–10.5)
nRBC: 0.1 % (ref 0.0–0.2)

## 2018-10-16 LAB — I-STAT BETA HCG BLOOD, ED (NOT ORDERABLE): I-stat hCG, quantitative: 19.5 m[IU]/mL — ABNORMAL HIGH (ref <5)

## 2018-10-16 LAB — CG4 I-STAT (LACTIC ACID)
LACTIC ACID, VENOUS: 1.72 mmol/L (ref 0.5–1.9)
LACTIC ACID, VENOUS: 1.36 mmol/L (ref 0.5–1.9)

## 2018-10-16 LAB — HCG, QUANTITATIVE, PREGNANCY: hCG, Beta Chain, Quant, S: 2 m[IU]/mL (ref <5)

## 2018-10-16 MED ORDER — ONDANSETRON HCL 4 MG/2ML IJ SOLN: 4.0000 mg | Freq: Four times a day (QID) | INTRAMUSCULAR | Status: DC | PRN

## 2018-10-16 MED ORDER — LORAZEPAM 0.5 MG PO TABS: 0.5000 mg | ORAL_TABLET | Freq: Four times a day (QID) | ORAL | Status: DC | PRN

## 2018-10-16 MED ORDER — SODIUM CHLORIDE 0.9 % IV SOLN
2.0000 g | Freq: Once | INTRAVENOUS | Status: AC
  Administered 2018-10-16: 2 g via INTRAVENOUS
  Filled 2018-10-16: qty 2

## 2018-10-16 MED ORDER — HEPARIN SODIUM (PORCINE) 5000 UNIT/ML IJ SOLN: 5000.0000 [IU] | Freq: Three times a day (TID) | INTRAMUSCULAR | Status: DC

## 2018-10-16 MED ORDER — RIVAROXABAN 10 MG PO TABS
10.0000 mg | ORAL_TABLET | Freq: Every day | ORAL | Status: DC
  Administered 2018-10-16 – 2018-10-19 (×4): 10 mg via ORAL
  Filled 2018-10-16 (×4): qty 1

## 2018-10-16 MED ORDER — SODIUM CHLORIDE 0.9 % IV SOLN
1.0000 g | Freq: Three times a day (TID) | INTRAVENOUS | Status: DC
  Administered 2018-10-16 – 2018-10-19 (×9): 1 g via INTRAVENOUS
  Filled 2018-10-16 (×10): qty 1

## 2018-10-16 MED ORDER — VANCOMYCIN HCL IN DEXTROSE 1-5 GM/200ML-% IV SOLN
1000.0000 mg | Freq: Two times a day (BID) | INTRAVENOUS | Status: DC
  Administered 2018-10-16 – 2018-10-19 (×6): 1000 mg via INTRAVENOUS
  Filled 2018-10-16 (×6): qty 200

## 2018-10-16 MED ORDER — PROCHLORPERAZINE MALEATE 10 MG PO TABS: 10.0000 mg | ORAL_TABLET | Freq: Four times a day (QID) | ORAL | Status: DC | PRN

## 2018-10-16 MED ORDER — VITAMIN B-12 1000 MCG PO TABS
2000.0000 ug | ORAL_TABLET | Freq: Every day | ORAL | Status: DC
  Administered 2018-10-17 – 2018-10-19 (×3): 2000 ug via ORAL
  Filled 2018-10-16 (×3): qty 2

## 2018-10-16 MED ORDER — SODIUM CHLORIDE 0.9 % IV BOLUS (SEPSIS)
1000.0000 mL | Freq: Once | INTRAVENOUS | Status: AC
  Administered 2018-10-16: 1000 mL via INTRAVENOUS

## 2018-10-16 MED ORDER — METRONIDAZOLE IN NACL 5-0.79 MG/ML-% IV SOLN
500.0000 mg | Freq: Three times a day (TID) | INTRAVENOUS | Status: DC
  Administered 2018-10-16 – 2018-10-17 (×3): 500 mg via INTRAVENOUS
  Filled 2018-10-16 (×3): qty 100

## 2018-10-16 MED ORDER — VANCOMYCIN HCL IN DEXTROSE 1-5 GM/200ML-% IV SOLN
1000.0000 mg | Freq: Once | INTRAVENOUS | Status: AC
  Administered 2018-10-16: 1000 mg via INTRAVENOUS
  Filled 2018-10-16: qty 200

## 2018-10-16 MED ORDER — B COMPLEX-C PO TABS
1.0000 | ORAL_TABLET | Freq: Every day | ORAL | Status: DC
  Administered 2018-10-16 – 2018-10-19 (×4): 1 via ORAL
  Filled 2018-10-16 (×4): qty 1

## 2018-10-16 MED ORDER — SODIUM CHLORIDE 0.9 % IV BOLUS (SEPSIS)
250.0000 mL | Freq: Once | INTRAVENOUS | Status: AC
  Administered 2018-10-16: 250 mL via INTRAVENOUS

## 2018-10-16 MED ORDER — FERROUS SULFATE 325 (65 FE) MG PO TABS
325.0000 mg | ORAL_TABLET | Freq: Every day | ORAL | Status: DC
  Administered 2018-10-17 – 2018-10-19 (×3): 325 mg via ORAL
  Filled 2018-10-16 (×3): qty 1

## 2018-10-16 MED ORDER — HYDROCODONE-ACETAMINOPHEN 5-325 MG PO TABS
1.0000 | ORAL_TABLET | Freq: Four times a day (QID) | ORAL | Status: DC | PRN
  Administered 2018-10-16: 1 via ORAL
  Filled 2018-10-16: qty 1

## 2018-10-16 MED ORDER — LORATADINE 10 MG PO TABS
10.0000 mg | ORAL_TABLET | Freq: Every day | ORAL | Status: DC
  Administered 2018-10-16 – 2018-10-19 (×4): 10 mg via ORAL
  Filled 2018-10-16 (×4): qty 1

## 2018-10-16 NOTE — Progress Notes (Signed)
Pharmacy Note   A consult was received from an ED physician for aztreonam and vancomycin per pharmacy dosing.    The patient's profile has been reviewed for ht/wt/allergies/indication/available labs.    A one time order has been placed for vancomycin 1000 mg IV x1 and aztreonam 2 gr IV x1 .  Further antibiotics/pharmacy consults should be ordered by admitting physician if indicated.                       Thank you,  Royetta Asal, PharmD, BCPS Pager [phone] 10/16/2018 12:54 PM

## 2018-10-16 NOTE — H&P (Addendum)
History and Physical  Cassandra Clark DXA:[id number] DOB: 02-12-1981 DOA: 10/16/2018  PCP: Patient, No Pcp Per Patient coming from: Home  I have personally briefly reviewed patient's old medical records in [place]   Chief Complaint: Fever, SOB and cough   HPI: Cassandra Clark is a 37 y.o. female past medical history significant for hodgkin's lymphoma recently diagnosed in October 2019 on chemotherapy with her last chemotherapy at the cancer center here at [facility] last chemo on 10/04/2018 comes in for fever and cough and shortness of breath that started about 2 days prior to admission. She relates she took her temperature at home it was 102.7 the day prior to admission.  She relates she has shortness of breath with ambulation.  She denies any nausea vomiting diarrhea chest pain or sick contacts.   Review of Systems: All systems reviewed and apart from history of presenting illness, are negative.  Past Medical History:  Diagnosis Date  . Anemia    Past Surgical History:  Procedure Laterality Date  . PICC LINE INSERTION Right 08/18/2018   Social History:  reports that she has never smoked. She has never used smokeless tobacco. She reports that she does not drink alcohol or use drugs.   Allergies  Allergen Reactions  . Penicillins Hives    Has patient had a PCN reaction causing immediate rash, facial/tongue/throat swelling, SOB or lightheadedness with hypotension: Unknown Has patient had a PCN reaction causing severe rash involving mucus membranes or skin necrosis: Unknown Has patient had a PCN reaction that required hospitalization: Unknown Has patient had a PCN reaction occurring within the last 10 years: Unknown If all of the above answers are "NO", then may proceed with Cephalosporin use.     Family History  Problem Relation Age of Onset  . Diabetes Mellitus II Father   . Hypertension Father   . Congestive Heart Failure Maternal Grandmother   .  Diabetes Mellitus II Maternal Grandmother   . Diabetes Mellitus II Maternal Uncle   . Congestive Heart Failure Maternal Uncle       Prior to Admission medications   Medication Sig Start Date End Date Taking? Authorizing Provider  B Complex-C (B-COMPLEX WITH VITAMIN C) tablet Take 1 tablet by mouth daily. 09/20/18  Yes Brunetta Genera, MD  Cyanocobalamin (B-12) 1000 MCG SUBL Place 2,000 mcg under the tongue daily. 09/20/18  Yes Brunetta Genera, MD  dexamethasone (DECADRON) 4 MG tablet Take 2 tablets by mouth once a day on the day after chemotherapy and then take 2 tablets two times a day for 2 days. Take with food. 09/20/18  Yes Brunetta Genera, MD  HYDROcodone-acetaminophen (NORCO) 5-325 MG tablet Take 1-2 tablets by mouth every 6 (six) hours as needed for moderate pain or severe pain. 08/23/18  Yes Brunetta Genera, MD  loratadine (CLARITIN) 10 MG tablet Take 10 mg by mouth daily.   Yes [provider]  rivaroxaban (XARELTO) 10 MG TABS tablet Take 1 tablet (10 mg total) by mouth daily. 10/15/18  Yes Brunetta Genera, MD  ferrous sulfate 325 (65 FE) MG tablet Take 1 tablet (325 mg total) by mouth daily. Patient not taking: Reported on 08/23/2018 08/06/18   Raiford Noble Latif, DO  lidocaine-prilocaine (EMLA) cream Apply to affected area once 08/09/18   Brunetta Genera, MD  LORazepam (ATIVAN) 0.5 MG tablet Take 1 tablet (0.5 mg total) by mouth every 6 (six) hours as needed (Nausea or vomiting). 08/09/18  Brunetta Genera, MD  ondansetron (ZOFRAN) 8 MG tablet Take 1 tablet (8 mg total) by mouth 2 (two) times daily as needed. Start on the third day after chemotherapy. 08/09/18   Brunetta Genera, MD  prochlorperazine (COMPAZINE) 10 MG tablet Take 1 tablet (10 mg total) by mouth every 6 (six) hours as needed (Nausea or vomiting). 08/09/18   Brunetta Genera, MD   Physical Exam: Vitals:   10/16/18 1144 10/16/18 1400 10/16/18 1500  BP: 114/72 118/69  111/64  Pulse: (!) 145 (!) 123 (!) 125  Resp: 20 19 19   Temp: (!) 100.4 F (38 C)    TempSrc: Oral    SpO2: 98% 100% 100%     General exam: Moderately built and nourished patient, lying comfortably supine on the gurney in no obvious distress.  Head, eyes and ENT: Atraumatic, sclera is anicteric  Neck: Supple. No JVD, carotid bruit or thyromegaly.  Lymphatics: She has no palpable lymphadenopathy  Respiratory system: She has good air movement with no increased work of breathing, but she does has crackles on the right lung lower lobe  Cardiovascular system: Tachycardic with regular rate and rhythm positive S1-S2 no JVD.  Gastrointestinal system: Abdomen is nondistended, soft and nontender. Normal bowel sounds heard. No organomegaly or masses appreciated.  Central nervous system: Alert and oriented. No focal neurological deficits.  Extremities: Symmetric 5 x 5 power. Peripheral pulses symmetrically felt.   Skin: No rashes or acute findings.  Musculoskeletal system: Negative exam.  Psychiatry: Pleasant and cooperative.   Labs on Admission:  Basic Metabolic Panel: Recent Labs  Lab 10/16/18 1310  NA 136  K 3.8  CL 101  CO2 26  GLUCOSE 149*  BUN 8  CREATININE 0.80  CALCIUM 8.8*   Liver Function Tests: Recent Labs  Lab 10/16/18 1310  AST 22  ALT 25  ALKPHOS 95  BILITOT 0.6  PROT 6.2*  ALBUMIN 3.4*   No results for input(s): LIPASE, AMYLASE in the last 168 hours. No results for input(s): AMMONIA in the last 168 hours. CBC: Recent Labs  Lab 10/16/18 1310  WBC 21.8*  NEUTROABS 16.5*  HGB 9.1*  HCT 30.9*  MCV 88.5  PLT 268   Cardiac Enzymes: No results for input(s): CKTOTAL, CKMB, CKMBINDEX, TROPONINI in the last 168 hours.  BNP (last 3 results) No results for input(s): PROBNP in the last 8760 hours. CBG: No results for input(s): GLUCAP in the last 168 hours.  Radiological Exams on Admission: Dg Chest 2 View  Result Date: 10/16/2018 CLINICAL  DATA:  Cough and fever. Undergoing chemotherapy for Hodgkin's lymphoma. EXAM: CHEST - 2 VIEW COMPARISON:  08/03/2018 FINDINGS: Heart size is normal. Right arm PICC line is seen in appropriate position. New streaky opacity is seen in both lung bases, right side greater than left, which may be due to atelectasis or infiltrates. No evidence of pulmonary edema or pleural effusion. Previously seen anterior mediastinal mass is no longer visualized. IMPRESSION: Right greater than left basilar atelectasis versus infiltrate. Significant decrease in size of anterior mediastinal mass since prior study. Electronically Signed   By: Earle Gell M.D.   On: 10/16/2018 12:43    EKG: Independently reviewed.   Assessment/Plan Active Problems:   Hodgkin's lymphoma, adult ([place])   Healthcare-associated pneumonia  She has been recently discharged from the hospital less than 90 days ago, was started empirically on IV vancomycin and cefepime.  She has an allergy to penicillins which is hives there is less than 5% cross-reactivity between  cephalosporins and penicillin so we will continue cefepime.  Blood cultures and sputum cultures. She has been fluid resuscitated aggressively in the ED. Her tachycardia is slowly improving. She is not de-satting so we will admit her to the [place] floor. Continue Xarelto for SVC prophylaxis started by Dr. Irene Limbo.  DVT Prophylaxis: lovenox Code Status: full  Family Communication: none  Disposition Plan: Admit to MedSurg once shortness of breath and pneumonia symptoms improved.  Time spent: 90 min    It is my clinical opinion that admission to INPATIENT is reasonable and necessary in this 37 y.o. female with past medical history of Hodgkin's lymphoma on chemotherapy with a new infiltrates leukocytosis, shortness of breath cough and fever I think it is prudent to admit this patient as an inpatient and start her empirically on antibiotics due to the high risk of complications.  Given  the aforementioned, the predictability of an adverse outcome is felt to be significant. I expect that the patient will require at least 2 midnights in the hospital to treat this condition.  Charlynne Cousins MD [facility]  If 7PM-7AM, please contact night-coverage [email or web address] Password [facility]  10/16/2018, 4:12 PM

## 2018-10-16 NOTE — Progress Notes (Signed)
Pharmacy Antibiotic Note  Cassandra Clark is a 37 y.o. female admitted on 10/16/2018 with pneumonia.  Pharmacy has been consulted for vancomycin dosing.  Plan: Vancomycin 1000 mg IV q12h Cefepime 2 gr IV x1, then cefepime 1 gr IV q8h  Monitor clinical course, renal function, cultures as available     Temp (24hrs), Avg:99.9 F (37.7 C), Min:99.4 F (37.4 C), Max:100.4 F (38 C)  Recent Labs  Lab 10/16/18 1310 10/16/18 1318 10/16/18 1518  WBC 21.8*  --   --   CREATININE 0.80  --   --   LATICACIDVEN  --  1.72 1.36    CrCl cannot be calculated (Unknown ideal weight.).    Allergies  Allergen Reactions  . Penicillins Hives    Has patient had a PCN reaction causing immediate rash, facial/tongue/throat swelling, SOB or lightheadedness with hypotension: Unknown Has patient had a PCN reaction causing severe rash involving mucus membranes or skin necrosis: Unknown Has patient had a PCN reaction that required hospitalization: Unknown Has patient had a PCN reaction occurring within the last 10 years: Unknown If all of the above answers are "NO", then may proceed with Cephalosporin use.     Antimicrobials this admission: 12/21 aztreonam x 1 in ED 12/21 cefepime >>  12/21 vancomycin >>  12/21 metronidazole >>     Dose adjustments this admission:   Microbiology results: 12/21 BCx:  12/21 Sputum:   12/21 HIV antibody:  12/21 Strep pneumo antigen:   Thank you for allowing pharmacy to be a part of this patient's care.   Royetta Asal, PharmD, BCPS Pager [phone] 10/16/2018 6:22 PM

## 2018-10-16 NOTE — ED Notes (Signed)
ED TO INPATIENT HANDOFF REPORT  Name/Age/Gender Cassandra Clark 37 y.o. female  Code Status Code Status History    Date Active Date Inactive Code Status Order ID Comments User Context   08/04/2018 0333 08/06/2018 2315 Full Code [id number]  Reubin Milan, MD ED      Home/SNF/Other Home  Chief Complaint CA pt  -  fever  Level of Care/Admitting Diagnosis ED Disposition    ED Disposition Condition [place]: [facility] [[id number]]  Level of Care: Med-Surg [16]  Diagnosis: CAP (community acquired pneumonia) [[id number]]  Admitting Physician: Charlynne Cousins [[id number]]  Attending Physician: Charlynne Cousins [[id number]]  Estimated length of stay: past midnight tomorrow  Certification:: I certify this patient will need inpatient services for at least 2 midnights  PT Class (Do Not Modify): Inpatient [101]  PT Acc Code (Do Not Modify): Private [1]       Medical History Past Medical History:  Diagnosis Date  . Anemia     Allergies Allergies  Allergen Reactions  . Penicillins Hives    Has patient had a PCN reaction causing immediate rash, facial/tongue/throat swelling, SOB or lightheadedness with hypotension: Unknown Has patient had a PCN reaction causing severe rash involving mucus membranes or skin necrosis: Unknown Has patient had a PCN reaction that required hospitalization: Unknown Has patient had a PCN reaction occurring within the last 10 years: Unknown If all of the above answers are "NO", then may proceed with Cephalosporin use.     IV Location/Drains/Wounds Patient Lines/Drains/Airways Status   Active Line/Drains/Airways    Name:   Placement date:   Placement time:   Site:   Days:   PICC Single Lumen 89/38/10 PICC Right Basilic 40 cm   17/51/02    5852    Basilic   59          Labs/Imaging Results for orders placed or performed during the hospital encounter of 10/16/18 (from the past 48 hour(s))  Urinalysis, Routine  w reflex microscopic     Status: Abnormal   Collection Time: 10/16/18 11:53 AM  Result Value Ref Range   Color, Urine STRAW (A) YELLOW   APPearance CLEAR CLEAR   Specific Gravity, Urine 1.003 (L) 1.005 - 1.030   pH 7.0 5.0 - 8.0   Glucose, UA NEGATIVE NEGATIVE mg/dL   Hgb urine dipstick NEGATIVE NEGATIVE   Bilirubin Urine NEGATIVE NEGATIVE   Ketones, ur NEGATIVE NEGATIVE mg/dL   Protein, ur NEGATIVE NEGATIVE mg/dL   Nitrite NEGATIVE NEGATIVE   Leukocytes, UA TRACE (A) NEGATIVE   RBC / HPF 0-5 0 - 5 RBC/hpf   WBC, UA 0-5 0 - 5 WBC/hpf   Bacteria, UA MANY (A) NONE SEEN   Squamous Epithelial / LPF 0-5 0 - 5    Comment: Performed at [facility], [place] [address] [place], [place] [address]  Comprehensive metabolic panel     Status: Abnormal   Collection Time: 10/16/18  1:10 PM  Result Value Ref Range   Sodium 136 135 - 145 mmol/L   Potassium 3.8 3.5 - 5.1 mmol/L   Chloride 101 98 - 111 mmol/L   CO2 26 22 - 32 mmol/L   Glucose, Bld 149 (H) 70 - 99 mg/dL   BUN 8 6 - 20 mg/dL   Creatinine, Ser 0.80 0.44 - 1.00 mg/dL   Calcium 8.8 (L) 8.9 - 10.3 mg/dL   Total Protein 6.2 (L) 6.5 - 8.1 g/dL   Albumin  3.4 (L) 3.5 - 5.0 g/dL   AST 22 15 - 41 U/L   ALT 25 0 - 44 U/L   Alkaline Phosphatase 95 38 - 126 U/L   Total Bilirubin 0.6 0.3 - 1.2 mg/dL   GFR calc non Af Amer >60 >60 mL/min   GFR calc Af Amer >60 >60 mL/min   Anion gap 9 5 - 15    Comment: Performed at [facility], [place] [address], [place], [place] [address]  CBC with Differential     Status: Abnormal   Collection Time: 10/16/18  1:10 PM  Result Value Ref Range   WBC 21.8 (H) 4.0 - 10.5 K/uL   RBC 3.49 (L) 3.87 - 5.11 MIL/uL   Hemoglobin 9.1 (L) 12.0 - 15.0 g/dL   HCT 30.9 (L) 36.0 - 46.0 %   MCV 88.5 80.0 - 100.0 fL   MCH 26.1 26.0 - 34.0 pg   MCHC 29.4 (L) 30.0 - 36.0 g/dL   RDW 24.3 (H) 11.5 - 15.5 %   Platelets 268 150 - 400 K/uL   nRBC 0.1 0.0 - 0.2 %   Neutrophils Relative % 74  %   Neutro Abs 16.5 (H) 1.7 - 7.7 K/uL   Lymphocytes Relative 4 %   Lymphs Abs 0.8 0.7 - 4.0 K/uL   Monocytes Relative 9 %   Monocytes Absolute 1.9 (H) 0.1 - 1.0 K/uL   Eosinophils Relative 1 %   Eosinophils Absolute 0.1 0.0 - 0.5 K/uL   Basophils Relative 1 %   Basophils Absolute 0.1 0.0 - 0.1 K/uL   Immature Granulocytes 11 %   Abs Immature Granulocytes 2.32 (H) 0.00 - 0.07 K/uL    Comment: Performed at [facility], [place] [address] [place], [place] [address]  Protime-INR     Status: Abnormal   Collection Time: 10/16/18  1:10 PM  Result Value Ref Range   Prothrombin Time 16.2 (H) 11.4 - 15.2 seconds   INR 1.31     Comment: Performed at [facility], [place] [address], [place], [place] [address]  I-Stat beta hCG blood, ED     Status: Abnormal   Collection Time: 10/16/18  1:16 PM  Result Value Ref Range   I-stat hCG, quantitative 19.5 (H) <5 mIU/mL   Comment 3            Comment:   GEST. AGE      CONC.  (mIU/mL)   <=1 WEEK        5 - 50     2 WEEKS       50 - 500     3 WEEKS       100 - 10,000     4 WEEKS     1,000 - 30,000        FEMALE AND NON-PREGNANT FEMALE:     LESS THAN 5 mIU/mL   CG4 I-STAT (Lactic acid)     Status: None   Collection Time: 10/16/18  1:18 PM  Result Value Ref Range   Lactic Acid, Venous 1.72 0.5 - 1.9 mmol/L  CG4 I-STAT (Lactic acid)     Status: None   Collection Time: 10/16/18  3:18 PM  Result Value Ref Range   Lactic Acid, Venous 1.36 0.5 - 1.9 mmol/L   Dg Chest 2 View  Result Date: 10/16/2018 CLINICAL DATA:  Cough and fever. Undergoing chemotherapy for Hodgkin's lymphoma. EXAM: CHEST - 2 VIEW COMPARISON:  08/03/2018 FINDINGS: Heart size is normal.  Right arm PICC line is seen in appropriate position. New streaky opacity is seen in both lung bases, right side greater than left, which may be due to atelectasis or infiltrates. No evidence of pulmonary edema or pleural effusion. Previously seen anterior mediastinal  mass is no longer visualized. IMPRESSION: Right greater than left basilar atelectasis versus infiltrate. Significant decrease in size of anterior mediastinal mass since prior study. Electronically Signed   By: Earle Gell M.D.   On: 10/16/2018 12:43   None  Pending Labs Unresulted Labs (From admission, onward)    Start     Ordered   10/16/18 1328  hCG, quantitative, pregnancy  Once,   STAT     10/16/18 1327   10/16/18 1153  Culture, blood (Routine x 2)  BLOOD CULTURE X 2,   STAT     10/16/18 1153          Vitals/Pain Today's Vitals   10/16/18 1144 10/16/18 1151 10/16/18 1400 10/16/18 1500  BP: 114/72  118/69 111/64  Pulse: (!) 145  (!) 123 (!) 125  Resp: 20  19 19   Temp: (!) 100.4 F (38 C)     TempSrc: Oral     SpO2: 98%  100% 100%  PainSc:  0-No pain      Isolation Precautions No active isolations  Medications Medications  metroNIDAZOLE (FLAGYL) IVPB 500 mg (500 mg Intravenous New Bag/Given 10/16/18 1442)  aztreonam (AZACTAM) 2 g in sodium chloride 0.9 % 100 mL IVPB (0 g Intravenous Stopped 10/16/18 1441)  vancomycin (VANCOCIN) IVPB 1000 mg/200 mL premix (0 mg Intravenous Stopped 10/16/18 1440)  sodium chloride 0.9 % bolus 1,000 mL (0 mLs Intravenous Stopped 10/16/18 1512)    And  sodium chloride 0.9 % bolus 1,000 mL (0 mLs Intravenous Stopped 10/16/18 1610)    And  sodium chloride 0.9 % bolus 250 mL (0 mLs Intravenous Stopped 10/16/18 1610)    Mobility walks

## 2018-10-16 NOTE — ED Provider Notes (Addendum)
[facility] DEPT Provider Note   CSN: [id number] Arrival date & time: 10/16/18  1134     History   Chief Complaint Chief Complaint  Patient presents with  . Fever  . Cough    HPI Cassandra Clark is a 37 y.o. female.  HPI 37 year old female history of Hodgkin's lymphoma on chemotherapy with last chemo 12 9 presents today with report of fever to 102.7 last night.  She has had some nasal congestion and cough.  She reports that she took Tylenol last night after taking her temperature and her temperature came down somewhat.  Today on the way to the hospital she took her temperature again and it was over 101.  She denies any dyspnea, nausea, vomiting, diarrhea, abdominal pain, wounds or cellulitis, or changes in urinary symptoms.  Last menstrual period was in October and she has not had any periods for the last 2 months with chemo Past Medical History:  Diagnosis Date  . Anemia     Patient Active Problem List   Diagnosis Date Noted  . Port-A-Cath in place 08/23/2018  . Hodgkin's lymphoma ([place]) 08/06/2018  . Counseling regarding advance care planning and goals of care 08/06/2018  . Hyperglycemia 08/05/2018  . B12 deficiency   . Mediastinal mass   . Symptomatic anemia   . 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 Hypophosphatemia 08/04/2018  . Anemia 08/04/2018  . Leukopenia 08/04/2018  . Lymphoma ([place]) 08/04/2018    Past Surgical History:  Procedure Laterality Date  . PICC LINE INSERTION Right 08/18/2018     OB History   No obstetric history on file.      Home Medications    Prior to Admission medications   Medication Sig Start Date End Date Taking? Authorizing Provider  B Complex-C (B-COMPLEX WITH VITAMIN C) tablet Take 1 tablet by mouth daily. 09/20/18   Brunetta Genera, MD  Cyanocobalamin (B-12) 1000 MCG SUBL Place 2,000 mcg under the tongue daily. 09/20/18   Brunetta Genera, MD  dexamethasone (DECADRON) 4 MG tablet Take  2 tablets by mouth once a day on the day after chemotherapy and then take 2 tablets two times a day for 2 days. Take with food. 09/20/18   Brunetta Genera, MD  ferrous sulfate 325 (65 FE) MG tablet Take 1 tablet (325 mg total) by mouth daily. Patient not taking: Reported on 08/23/2018 08/06/18   Raiford Noble Latif, DO  HYDROcodone-acetaminophen [facility]) 5-325 MG tablet Take 1-2 tablets by mouth every 6 (six) hours as needed for moderate pain or severe pain. 08/23/18   Brunetta Genera, MD  lidocaine-prilocaine (EMLA) cream Apply to affected area once 08/09/18   Brunetta Genera, MD  loratadine (CLARITIN) 10 MG tablet Take 10 mg by mouth daily.    [provider]  LORazepam (ATIVAN) 0.5 MG tablet Take 1 tablet (0.5 mg total) by mouth every 6 (six) hours as needed (Nausea or vomiting). 08/09/18   Brunetta Genera, MD  ondansetron (ZOFRAN) 8 MG tablet Take 1 tablet (8 mg total) by mouth 2 (two) times daily as needed. Start on the third day after chemotherapy. 08/09/18   Brunetta Genera, MD  prochlorperazine (COMPAZINE) 10 MG tablet Take 1 tablet (10 mg total) by mouth every 6 (six) hours as needed (Nausea or vomiting). 08/09/18   Brunetta Genera, MD  rivaroxaban (XARELTO) 10 MG TABS tablet Take 1 tablet (10 mg total) by mouth daily. 10/15/18   Brunetta Genera, MD  Family History Family History  Problem Relation Age of Onset  . Diabetes Mellitus II Father   . Hypertension Father   . Congestive Heart Failure Maternal Grandmother   . Diabetes Mellitus II Maternal Grandmother   . Diabetes Mellitus II Maternal Uncle   . Congestive Heart Failure Maternal Uncle     Social History Social History   Tobacco Use  . Smoking status: Never Smoker  . Smokeless tobacco: Never Used  Substance Use Topics  . Alcohol use: No  . Drug use: No    Comment: Drinks tea     Allergies   Penicillins   Review of Systems Review of Systems  All other systems reviewed  and are negative.    Physical Exam Updated Vital Signs BP 114/72 (BP Location: Left Arm)   Pulse (!) 145   Temp (!) 100.4 F (38 C) (Oral)   Resp 20   SpO2 98%   Physical Exam Vitals signs and nursing note reviewed.  Constitutional:      Appearance: Normal appearance.  HENT:     Head: Normocephalic.     Right Ear: Tympanic membrane and ear canal normal.     Left Ear: Tympanic membrane and ear canal normal.     Nose: Nose normal.     Mouth/Throat:     Mouth: Mucous membranes are moist.     Pharynx: Oropharynx is clear.     Comments: Some pharyngeal erythema noted without swelling or exudate Eyes:     Extraocular Movements: Extraocular movements intact.     Conjunctiva/sclera: Conjunctivae normal.     Pupils: Pupils are equal, round, and reactive to light.  Neck:     Musculoskeletal: Normal range of motion.  Cardiovascular:     Rate and Rhythm: Tachycardia present.     Pulses: Normal pulses.     Heart sounds: Normal heart sounds.     Comments: Tachycardia to 140 Pulmonary:     Effort: Pulmonary effort is normal.     Comments: Decreased breath sounds throughout Abdominal:     General: Abdomen is flat. Bowel sounds are normal.     Palpations: Abdomen is soft.  Musculoskeletal: Normal range of motion.  Skin:    General: Skin is warm and dry.     Findings: No lesion or rash.  Neurological:     General: No focal deficit present.     Mental Status: She is alert and oriented to person, place, and time.  Psychiatric:        Mood and Affect: Mood normal.      ED Treatments / Results  Labs (all labs ordered are listed, but only abnormal results are displayed) Labs Reviewed  CULTURE, BLOOD (ROUTINE X 2)  CULTURE, BLOOD (ROUTINE X 2)  COMPREHENSIVE METABOLIC PANEL  CBC WITH DIFFERENTIAL/PLATELET  PROTIME-INR  URINALYSIS, ROUTINE W REFLEX MICROSCOPIC  I-STAT CG4 LACTIC ACID, ED  I-STAT BETA HCG BLOOD, ED ([facility], [facility], [facility] ONLY)    EKG None  Radiology No results  found.  Procedures .Critical Care Performed by: Pattricia Boss, MD Authorized by: Pattricia Boss, MD   Critical care provider statement:    Critical care time (minutes):  45   Critical care was necessary to treat or prevent imminent or life-threatening deterioration of the following conditions:  Sepsis   Critical care was time spent personally by me on the following activities:  Discussions with consultants, evaluation of patient's response to treatment, examination of patient, ordering and performing treatments and interventions, ordering and review of  laboratory studies, ordering and review of radiographic studies, pulse oximetry, re-evaluation of patient's condition, obtaining history from patient or surrogate and review of old charts   (including critical care time)  Medications Ordered in ED Medications - No data to display   Initial Impression / Assessment and Plan / ED Course  I have reviewed the triage vital signs and the nursing notes.  Pertinent labs & imaging results that were available during my care of the patient were reviewed by me and considered in my medical decision making (see chart for details).    Sepsis - Repeat Assessment  Performed at:    3:40 PM   Vitals     Blood pressure 111/64, pulse (!) 125, temperature (!) 100.4 F (38 C), temperature source Oral, resp. rate 19, SpO2 100 %.  Heart:     Tachycardic  Lungs:    CTA  Capillary Refill:   <2 sec  Peripheral Pulse:   Radial pulse palpable  Skin:     Normal Color    Patient with lymphoma, fever, likely source of infection pneumonia.  Patient treated with iv fluids, broad spectrum abx (patient with pcn allergy)  Final Clinical Impressions(s) / ED Diagnoses   Final diagnoses:  Healthcare-associated pneumonia    ED Discharge Orders    None       Pattricia Boss, MD 10/23/18 0006    Pattricia Boss, MD 11/08/18 1506

## 2018-10-16 NOTE — ED Triage Notes (Signed)
Pt complains of fever of 102.7 and cough since last night. Pt has hx of Hodgkin's lymphoma, last had chemo on 12/9.

## 2018-10-16 NOTE — ED Notes (Signed)
I have just given report to [place], RN on [place].

## 2018-10-16 NOTE — ED Notes (Signed)
She tells me she is feeling "better". She had ambulated to b.r. and back without problem. She is pleasantly visiting with her adult female friend/family.

## 2018-10-17 LAB — CBC WITH DIFFERENTIAL/PLATELET
Abs Immature Granulocytes: 1.46 10*3/uL — ABNORMAL HIGH (ref 0.00–0.07)
Basophils Absolute: 0.1 10*3/uL (ref 0.0–0.1)
Basophils Relative: 0 %
EOS PCT: 1 %
Eosinophils Absolute: 0.1 10*3/uL (ref 0.0–0.5)
HCT: 27.2 % — ABNORMAL LOW (ref 36.0–46.0)
Hemoglobin: 8.3 g/dL — ABNORMAL LOW (ref 12.0–15.0)
Immature Granulocytes: 7 %
Lymphocytes Relative: 4 %
Lymphs Abs: 0.8 10*3/uL (ref 0.7–4.0)
MCH: 27 pg (ref 26.0–34.0)
MCHC: 30.5 g/dL (ref 30.0–36.0)
MCV: 88.6 fL (ref 80.0–100.0)
Monocytes Absolute: 1.4 10*3/uL — ABNORMAL HIGH (ref 0.1–1.0)
Monocytes Relative: 6 %
Neutro Abs: 18.1 10*3/uL — ABNORMAL HIGH (ref 1.7–7.7)
Neutrophils Relative %: 82 %
Platelets: 243 10*3/uL (ref 150–400)
RBC: 3.07 MIL/uL — AB (ref 3.87–5.11)
RDW: 24.3 % — ABNORMAL HIGH (ref 11.5–15.5)
WBC: 21.9 10*3/uL — AB (ref 4.0–10.5)
nRBC: 0 % (ref 0.0–0.2)

## 2018-10-17 LAB — HIV ANTIBODY (ROUTINE TESTING W REFLEX): HIV Screen 4th Generation wRfx: NONREACTIVE

## 2018-10-17 LAB — STREP PNEUMONIAE URINARY ANTIGEN: STREP PNEUMO URINARY ANTIGEN: NEGATIVE

## 2018-10-17 MED ORDER — ACETAMINOPHEN 325 MG PO TABS
650.0000 mg | ORAL_TABLET | Freq: Four times a day (QID) | ORAL | Status: DC | PRN
  Administered 2018-10-17 – 2018-10-18 (×3): 650 mg via ORAL
  Filled 2018-10-17 (×3): qty 2

## 2018-10-17 MED ORDER — NYSTATIN 100000 UNIT/ML MT SUSP
5.0000 mL | Freq: Four times a day (QID) | OROMUCOSAL | Status: DC
  Administered 2018-10-17 – 2018-10-19 (×10): 500000 [IU] via OROMUCOSAL
  Filled 2018-10-17 (×10): qty 5

## 2018-10-17 NOTE — Progress Notes (Signed)
[facility] PROGRESS NOTE    Progress Note  Cassandra Clark  TKZ:[id number] DOB: January 28, 1981 DOA: 10/16/2018 PCP: Patient, No Pcp Per     Brief Narrative:   Cassandra Clark is an 37 y.o. female past medical history significant for Hodgkin's lymphoma recently diagnosed in October 2019, with her last chemotherapy and 10/04/2018 comes in for cough and shortness of breath that started 2 days prior to admission, chest x-ray was done she had a mild leukocytosis and fever is being treated for pneumonia.  Assessment/Plan:   Active Problems:   Hodgkin's lymphoma, adult ([place])   Healthcare-associated pneumonia   HCAP (healthcare-associated pneumonia)  She was started empirically on IV vancomycin and cefepime. Culture data is pending she continues to spike a fever of 103.0, CBC with differential is pending this morning. Her saturations are remained stable. She relates she is dyspneic on ambulation. She has crackles on physical exam which were not present there yesterday. Is not on IV fluids as she is able to hydrate herself orally. Continue Xarelto for SVC thrombosis prophylaxis.   DVT prophylaxis: Xarelto Family Communication:none Disposition Plan/Barrier to D/C: once pneumonia control Code Status:     Code Status Orders  (From admission, onward)         Start     Ordered   10/16/18 1643  Full code  Continuous     10/16/18 1642        Code Status History    Date Active Date Inactive Code Status Order ID Comments User Context   08/04/2018 0333 08/06/2018 2315 Full Code [id number]  Reubin Milan, MD ED        IV Access:    Peripheral IV   Procedures and diagnostic studies:   Dg Chest 2 View  Result Date: 10/16/2018 CLINICAL DATA:  Cough and fever. Undergoing chemotherapy for Hodgkin's lymphoma. EXAM: CHEST - 2 VIEW COMPARISON:  08/03/2018 FINDINGS: Heart size is normal. Right arm PICC line is seen in appropriate position. New streaky opacity is seen in both  lung bases, right side greater than left, which may be due to atelectasis or infiltrates. No evidence of pulmonary edema or pleural effusion. Previously seen anterior mediastinal mass is no longer visualized. IMPRESSION: Right greater than left basilar atelectasis versus infiltrate. Significant decrease in size of anterior mediastinal mass since prior study. Electronically Signed   By: Earle Gell M.D.   On: 10/16/2018 12:43     Medical Consultants:    None.  Anti-Infectives:   IV vancomycin and cefepime.  Subjective:    Cassandra Clark relates she feels about the same as yesterday, she gets winded when walking to the bathroom.  Objective:    Vitals:   10/16/18 2040 10/17/18 0440 10/17/18 0638 10/17/18 1016  BP: 120/67 101/62    Pulse: (!) 112 (!) 133    Resp: 18 20    Temp: 100.2 F (37.9 C) (!) 103 F (39.4 C) 100.3 F (37.9 C) 98.2 F (36.8 C)  TempSrc: Oral Oral Oral Oral  SpO2: 100% 90%      Intake/Output Summary (Last 24 hours) at 10/17/2018 1202 Last data filed at 10/17/2018 0300 Gross per 24 hour  Intake 2650 ml  Output -  Net 2650 ml   There were no vitals filed for this visit.  Exam: General exam: In no acute distress. Respiratory system: Air movement with crackles at bases bilaterally. Cardiovascular system: S1 & S2 heard, RRR.  No JVD Gastrointestinal system: Positive bowel sounds soft nontender nondistended  Central nervous system: Alert and oriented. No focal neurological deficits. Extremities: No lower extremity edema Skin: No rashes. Psychiatry: Judgement and insight appear normal. Mood & affect appropriate.    Data Reviewed:    Labs: Basic Metabolic Panel: Recent Labs  Lab 10/16/18 1310  NA 136  K 3.8  CL 101  CO2 26  GLUCOSE 149*  BUN 8  CREATININE 0.80  CALCIUM 8.8*   GFR CrCl cannot be calculated (Unknown ideal weight.). Liver Function Tests: Recent Labs  Lab 10/16/18 1310  AST 22  ALT 25  ALKPHOS 95  BILITOT 0.6    PROT 6.2*  ALBUMIN 3.4*   No results for input(s): LIPASE, AMYLASE in the last 168 hours. No results for input(s): AMMONIA in the last 168 hours. Coagulation profile Recent Labs  Lab 10/16/18 1310  INR 1.31    CBC: Recent Labs  Lab 10/16/18 1310  WBC 21.8*  NEUTROABS 16.5*  HGB 9.1*  HCT 30.9*  MCV 88.5  PLT 268   Cardiac Enzymes: No results for input(s): CKTOTAL, CKMB, CKMBINDEX, TROPONINI in the last 168 hours. BNP (last 3 results) No results for input(s): PROBNP in the last 8760 hours. CBG: No results for input(s): GLUCAP in the last 168 hours. D-Dimer: No results for input(s): DDIMER in the last 72 hours. Hgb A1c: No results for input(s): HGBA1C in the last 72 hours. Lipid Profile: No results for input(s): CHOL, HDL, LDLCALC, TRIG, CHOLHDL, LDLDIRECT in the last 72 hours. Thyroid function studies: No results for input(s): TSH, T4TOTAL, T3FREE, THYROIDAB in the last 72 hours.  Invalid input(s): FREET3 Anemia work up: No results for input(s): VITAMINB12, FOLATE, FERRITIN, TIBC, IRON, RETICCTPCT in the last 72 hours. Sepsis Labs: Recent Labs  Lab 10/16/18 1310 10/16/18 1318 10/16/18 1518  WBC 21.8*  --   --   LATICACIDVEN  --  1.72 1.36   Microbiology Recent Results (from the past 240 hour(s))  Culture, blood (Routine x 2)     Status: None (Preliminary result)   Collection Time: 10/16/18 11:58 AM  Result Value Ref Range Status   Specimen Description BLOOD LEFT ANTECUBITAL  Final   Special Requests   Final    BOTTLES DRAWN AEROBIC AND ANAEROBIC Blood Culture results may not be optimal due to an excessive volume of blood received in culture bottles Performed at [facility] [place] [address], [place], [place] [address]    Culture   Final    NO GROWTH < 12 HOURS Performed at [facility] [address] [place], [place] [address]    Report Status PENDING  Incomplete  Culture, blood (routine x 2) Call MD if unable to obtain prior  to antibiotics being given     Status: None (Preliminary result)   Collection Time: 10/16/18  4:43 PM  Result Value Ref Range Status   Specimen Description   Final    BLOOD LEFT HAND Performed at [facility] [address] [place], [place] [address]    Special Requests   Final    BOTTLES DRAWN AEROBIC AND ANAEROBIC Blood Culture results may not be optimal due to an inadequate volume of blood received in culture bottles Performed at [facility] [address] [place], [place] [address]    Culture   Final    NO GROWTH < 12 HOURS Performed at [facility] [address] [place], [place] [address]    Report Status PENDING  Incomplete  Medications:   . B-complex with vitamin C  1 tablet Oral Daily  . ferrous sulfate  325 mg Oral Daily  . loratadine  10 mg Oral Daily  . nystatin  5 mL Mouth/Throat QID  . rivaroxaban  10 mg Oral Daily  . vitamin B-12  2,000 mcg Oral Daily   Continuous Infusions: . ceFEPime (MAXIPIME) IV 1 g (10/17/18 0419)  . vancomycin 1,000 mg (10/17/18 1013)      LOS: 1 day   Charlynne Cousins  [facility]   *Please refer to [email or web address], password [email or web address] to get updated schedule on who will round on this patient, as hospitalists switch teams weekly. If 7PM-7AM, please contact night-coverage at [email or web address], password [email or web address] for any overnight needs.  10/17/2018, 12:02 PM

## 2018-10-18 ENCOUNTER — Other Ambulatory Visit: Payer: Self-pay

## 2018-10-18 ENCOUNTER — Inpatient Hospital Stay: Payer: Medicaid Other

## 2018-10-18 ENCOUNTER — Ambulatory Visit: Payer: Self-pay | Admitting: Hematology

## 2018-10-18 ENCOUNTER — Inpatient Hospital Stay: Payer: Medicaid Other | Admitting: Hematology

## 2018-10-18 ENCOUNTER — Ambulatory Visit: Payer: Self-pay

## 2018-10-18 DIAGNOSIS — J189 Pneumonia, unspecified organism: Principal | ICD-10-CM

## 2018-10-18 DIAGNOSIS — C8190 Hodgkin lymphoma, unspecified, unspecified site: Secondary | ICD-10-CM

## 2018-10-18 LAB — CBC WITH DIFFERENTIAL/PLATELET
Abs Immature Granulocytes: 0.91 10*3/uL — ABNORMAL HIGH (ref 0.00–0.07)
Basophils Absolute: 0.1 10*3/uL (ref 0.0–0.1)
Basophils Relative: 0 %
Eosinophils Absolute: 0.1 10*3/uL (ref 0.0–0.5)
Eosinophils Relative: 1 %
HCT: 27 % — ABNORMAL LOW (ref 36.0–46.0)
Hemoglobin: 8 g/dL — ABNORMAL LOW (ref 12.0–15.0)
Immature Granulocytes: 5 %
Lymphocytes Relative: 4 %
Lymphs Abs: 0.8 10*3/uL (ref 0.7–4.0)
MCH: 26.7 pg (ref 26.0–34.0)
MCHC: 29.6 g/dL — AB (ref 30.0–36.0)
MCV: 90 fL (ref 80.0–100.0)
Monocytes Absolute: 0.9 10*3/uL (ref 0.1–1.0)
Monocytes Relative: 5 %
NEUTROS PCT: 85 %
Neutro Abs: 16.1 10*3/uL — ABNORMAL HIGH (ref 1.7–7.7)
Platelets: 270 10*3/uL (ref 150–400)
RBC: 3 MIL/uL — ABNORMAL LOW (ref 3.87–5.11)
RDW: 24.1 % — ABNORMAL HIGH (ref 11.5–15.5)
WBC: 18.9 10*3/uL — ABNORMAL HIGH (ref 4.0–10.5)
nRBC: 0.1 % (ref 0.0–0.2)

## 2018-10-18 NOTE — Progress Notes (Signed)
HEMATOLOGY/ONCOLOGY INPATIENT PROGRESS NOTE  Date of Service: 10/18/2018  Inpatient Attending: .Charlynne Cousins, MD   SUBJECTIVE:   Cassandra Clark is accompanied today by her daughter at bedside. The pt reports that she is doing well overall.   The pt reports that she has a mild, dry cough. She initially saw a fever of 102.7 at home on 10/16/18, and felt SOB which is why she presented to the ED. The pt notes that she does not feel SOB when she walks around the room. She denies any diarrhea ,skin rashes, mouth sores, or problems passing urine. She also denies any pain oat the picc line site.   The pt is no longer having fevers after beginning antibiotic coverage. The pt denies being in contact with individuals with infections, and she did have her flu shot this year.   Lab results today (10/18/18) of CBC w/diff is as follows: all values are WNL except for WBC at 18.9k, RBC at 3.00, HGB at 8.0, HCT at 27.0, MCHC at 29.6, RDW at 24.1, ANC at 16.1k.  On review of systems, pt reports dry cough, good appetite, and denies diarrhea, skin rashes, pain around the picc line, skin rashes, mouth sores, problems passing urine, abdominal pains, abdominal tenderness, discomfort over the flanks, leg swelling, and any other symptoms.    OBJECTIVE: NAD  PHYSICAL EXAMINATION: . Vitals:   10/17/18 1822 10/17/18 1959 10/18/18 0427 10/18/18 1330  BP:   (!) 111/59 104/62  Pulse:   (!) 121 (!) 122  Resp:   17 16  Temp: (!) 100.9 F (38.3 C) 99.5 F (37.5 C) 99.9 F (37.7 C) 98 F (36.7 C)  TempSrc: Oral  Oral Oral  SpO2:   95% 95%   There were no vitals filed for this visit. .There is no height or weight on file to calculate BMI.  GENERAL:alert, in no acute distress and comfortable SKIN: skin color, texture, turgor are normal, no rashes or significant lesions EYES: normal, conjunctiva are pink and non-injected, sclera clear OROPHARYNX:no exudate, no erythema and lips, buccal mucosa, and  tongue normal  NECK: supple, no JVD, thyroid normal size, non-tender, without nodularity LYMPH:  no palpable lymphadenopathy in the cervical, axillary or inguinal LUNGS: clear to auscultation with normal respiratory effort HEART: regular rate & rhythm,  no murmurs and no lower extremity edema ABDOMEN: abdomen soft, non-tender, normoactive bowel sounds  Musculoskeletal: no cyanosis of digits and no clubbing  PSYCH: alert & oriented x 3 with fluent speech NEURO: no focal motor/sensory deficits  MEDICAL HISTORY:  Past Medical History:  Diagnosis Date  . Anemia     SURGICAL HISTORY: Past Surgical History:  Procedure Laterality Date  . PICC LINE INSERTION Right 08/18/2018    SOCIAL HISTORY: Social History   Socioeconomic History  . Marital status: Legally Separated    Spouse name: Not on file  . Number of children: Not on file  . Years of education: Not on file  . Highest education level: Not on file  Occupational History  . Not on file  Social Needs  . Financial resource strain: Not on file  . Food insecurity:    Worry: Not on file    Inability: Not on file  . Transportation needs:    Medical: Not on file    Non-medical: Not on file  Tobacco Use  . Smoking status: Never Smoker  . Smokeless tobacco: Never Used  Substance and Sexual Activity  . Alcohol use: No  . Drug use:  No    Comment: Drinks tea  . Sexual activity: Yes  Lifestyle  . Physical activity:    Days per week: Not on file    Minutes per session: Not on file  . Stress: Not on file  Relationships  . Social connections:    Talks on phone: Not on file    Gets together: Not on file    Attends religious service: Not on file    Active member of club or organization: Not on file    Attends meetings of clubs or organizations: Not on file    Relationship status: Not on file  . Intimate partner violence:    Fear of current or ex partner: Not on file    Emotionally abused: Not on file    Physically abused:  Not on file    Forced sexual activity: Not on file  Other Topics Concern  . Not on file  Social History Narrative  . Not on file    FAMILY HISTORY: Family History  Problem Relation Age of Onset  . Diabetes Mellitus II Father   . Hypertension Father   . Congestive Heart Failure Maternal Grandmother   . Diabetes Mellitus II Maternal Grandmother   . Diabetes Mellitus II Maternal Uncle   . Congestive Heart Failure Maternal Uncle     ALLERGIES:  is allergic to penicillins.  MEDICATIONS:  Scheduled Meds: . B-complex with vitamin C  1 tablet Oral Daily  . ferrous sulfate  325 mg Oral Daily  . loratadine  10 mg Oral Daily  . nystatin  5 mL Mouth/Throat QID  . rivaroxaban  10 mg Oral Daily  . vitamin B-12  2,000 mcg Oral Daily   Continuous Infusions: . ceFEPime (MAXIPIME) IV 1 g (10/18/18 1306)  . vancomycin 1,000 mg (10/18/18 0942)   PRN Meds:.acetaminophen, HYDROcodone-acetaminophen, LORazepam, ondansetron, prochlorperazine  REVIEW OF SYSTEMS:    10 Point review of Systems was done is negative except as noted above.   LABORATORY DATA:  I have reviewed the data as listed  . CBC Latest Ref Rng & Units 10/18/2018 10/17/2018 10/16/2018  WBC 4.0 - 10.5 K/uL 18.9(H) 21.9(H) 21.8(H)  Hemoglobin 12.0 - 15.0 g/dL 8.0(L) 8.3(L) 9.1(L)  Hematocrit 36.0 - 46.0 % 27.0(L) 27.2(L) 30.9(L)  Platelets 150 - 400 K/uL 270 243 268    . CMP Latest Ref Rng & Units 10/16/2018 10/04/2018 09/29/2018  Glucose 70 - 99 mg/dL 149(H) 142(H) 154(H)  BUN 6 - 20 mg/dL 8 12 13   Creatinine 0.44 - 1.00 mg/dL 0.80 0.78 0.73  Sodium 135 - 145 mmol/L 136 139 139  Potassium 3.5 - 5.1 mmol/L 3.8 4.1 3.6  Chloride 98 - 111 mmol/L 101 103 102  CO2 22 - 32 mmol/L 26 25 28   Calcium 8.9 - 10.3 mg/dL 8.8(L) 9.0 9.7  Total Protein 6.5 - 8.1 g/dL 6.2(L) 6.9 7.0  Total Bilirubin 0.3 - 1.2 mg/dL 0.6 0.2(L) <0.2(L)  Alkaline Phos 38 - 126 U/L 95 124 125  AST 15 - 41 U/L 22 18 21   ALT 0 - 44 U/L 25 28 36      RADIOGRAPHIC STUDIES: I have personally reviewed the radiological images as listed and agreed with the findings in the report. Dg Chest 2 View  Result Date: 10/16/2018 CLINICAL DATA:  Cough and fever. Undergoing chemotherapy for Hodgkin's lymphoma. EXAM: CHEST - 2 VIEW COMPARISON:  08/03/2018 FINDINGS: Heart size is normal. Right arm PICC line is seen in appropriate position. New streaky opacity is seen in  both lung bases, right side greater than left, which may be due to atelectasis or infiltrates. No evidence of pulmonary edema or pleural effusion. Previously seen anterior mediastinal mass is no longer visualized. IMPRESSION: Right greater than left basilar atelectasis versus infiltrate. Significant decrease in size of anterior mediastinal mass since prior study. Electronically Signed   By: Earle Gell M.D.   On: 10/16/2018 12:43    ASSESSMENT & PLAN:   37 y.o. 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 LDH - no significantly elevated. 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 With cytopenias concern for BM involvement with Hodgkins lymphoma  08/05/18 ECHO revealed LV EF of 65-70%. No pericardial effusion.   08/06/18 BM Bx revealed involvement by Hodgkin's lymphoma  08/13/18 PET/CT revealed Large hypermetabolic mass within the anterior mediastinum identified compatible with lymphoma, Deauville criteria 5. There is extensive hypermetabolic left supraclavicular, left axillary, left hilar and right iliac adenopathy, Deauville criteria 4 and 5. Additional lesions are identified within the caudate lobe of liver and spleen, Deauville criteria 4. Multifocal hypermetabolic bone lesions are also identified, Deauville criteria 5    Pt began C1 AAVD on 08/09/18  2. 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 SVC compression due to anterior mediastinal mass - no upper extremity , neck or face swelling or change in visiion to suggest overt SVC syndrome  4. Microcytic anemia - normal iron. Likely anemia or chronic disease  5. B12 deficiency ? 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    Sullivan Lone MD MS AAHIVMS [facility] Hematology/Oncology Physician [facility]  (Office):       [phone] (Work cell):  [phone] (Fax):           [phone]  10/18/2018 4:12 PM   I, Baldwin Jamaica, am acting as a scribe for Dr. Sullivan Lone.   .I have reviewed the above documentation for accuracy and completeness, and I agree with the above. Sullivan Lone MD MS

## 2018-10-18 NOTE — Progress Notes (Signed)
[facility] PROGRESS NOTE    Progress Note  Cassandra Clark  PXT:[id number] DOB: November 22, 1980 DOA: 10/16/2018 PCP: Patient, No Pcp Per     Brief Narrative:   Cassandra Clark is an 37 y.o. female past medical history significant for Hodgkin's lymphoma recently diagnosed in October 2019, with her last chemotherapy and 10/04/2018 comes in for cough and shortness of breath that started 2 days prior to admission, chest x-ray was done she had a mild leukocytosis and fever is being treated for pneumonia.  Assessment/Plan:   Active Problems:   Hodgkin's lymphoma, adult ([place])   Healthcare-associated pneumonia   HCAP (healthcare-associated pneumonia)  She was started empirically on IV vancomycin and cefepime. Culture data is negative till date, she had a mild temperature yesterday. Cytosis stabilized yesterday, CBC with differential is pending. Her saturations are remained stable. She relates she is dyspneic on ambulation. Continue IV antibiotics, ambulate patient. Continue Xarelto for SVC thrombosis prophylaxis.   DVT prophylaxis: Xarelto Family Communication:none Disposition Plan/Barrier to D/C: once pneumonia control Code Status:     Code Status Orders  (From admission, onward)         Start     Ordered   10/16/18 1643  Full code  Continuous     10/16/18 1642        Code Status History    Date Active Date Inactive Code Status Order ID Comments User Context   08/04/2018 0333 08/06/2018 2315 Full Code [id number]  Reubin Milan, MD ED        IV Access:    Peripheral IV   Procedures and diagnostic studies:   Dg Chest 2 View  Result Date: 10/16/2018 CLINICAL DATA:  Cough and fever. Undergoing chemotherapy for Hodgkin's lymphoma. EXAM: CHEST - 2 VIEW COMPARISON:  08/03/2018 FINDINGS: Heart size is normal. Right arm PICC line is seen in appropriate position. New streaky opacity is seen in both lung bases, right side greater than left, which may be due to  atelectasis or infiltrates. No evidence of pulmonary edema or pleural effusion. Previously seen anterior mediastinal mass is no longer visualized. IMPRESSION: Right greater than left basilar atelectasis versus infiltrate. Significant decrease in size of anterior mediastinal mass since prior study. Electronically Signed   By: Earle Gell M.D.   On: 10/16/2018 12:43     Medical Consultants:    None.  Anti-Infectives:   IV vancomycin and cefepime.  Subjective:    Cassandra Clark relates she feels about the same as yesterday, she still winded when she walks to the bathroom her appetite continues to improve.  Objective:    Vitals:   10/17/18 1416 10/17/18 1822 10/17/18 1959 10/18/18 0427  BP: 111/64   (!) 111/59  Pulse: (!) 125   (!) 121  Resp: 16   17  Temp: 98.7 F (37.1 C) (!) 100.9 F (38.3 C) 99.5 F (37.5 C) 99.9 F (37.7 C)  TempSrc: Oral Oral  Oral  SpO2: 97%   95%    Intake/Output Summary (Last 24 hours) at 10/18/2018 0732 Last data filed at 10/17/2018 1748 Gross per 24 hour  Intake 480 ml  Output -  Net 480 ml   There were no vitals filed for this visit.  Exam: General exam: In no acute distress. Respiratory system: Air movement with crackles at bases bilaterally. Cardiovascular system: S1 & S2 heard, RRR.  No JVD Gastrointestinal system: Positive bowel sounds soft nontender nondistended Central nervous system: Alert and oriented. No focal neurological deficits. Extremities: No  lower extremity edema Skin: No rashes. Psychiatry: Judgement and insight appear normal. Mood & affect appropriate.    Data Reviewed:    Labs: Basic Metabolic Panel: Recent Labs  Lab 10/16/18 1310  NA 136  K 3.8  CL 101  CO2 26  GLUCOSE 149*  BUN 8  CREATININE 0.80  CALCIUM 8.8*   GFR CrCl cannot be calculated (Unknown ideal weight.). Liver Function Tests: Recent Labs  Lab 10/16/18 1310  AST 22  ALT 25  ALKPHOS 95  BILITOT 0.6  PROT 6.2*  ALBUMIN 3.4*   No  results for input(s): LIPASE, AMYLASE in the last 168 hours. No results for input(s): AMMONIA in the last 168 hours. Coagulation profile Recent Labs  Lab 10/16/18 1310  INR 1.31    CBC: Recent Labs  Lab 10/16/18 1310 10/17/18 1204  WBC 21.8* 21.9*  NEUTROABS 16.5* 18.1*  HGB 9.1* 8.3*  HCT 30.9* 27.2*  MCV 88.5 88.6  PLT 268 243   Cardiac Enzymes: No results for input(s): CKTOTAL, CKMB, CKMBINDEX, TROPONINI in the last 168 hours. BNP (last 3 results) No results for input(s): PROBNP in the last 8760 hours. CBG: No results for input(s): GLUCAP in the last 168 hours. D-Dimer: No results for input(s): DDIMER in the last 72 hours. Hgb A1c: No results for input(s): HGBA1C in the last 72 hours. Lipid Profile: No results for input(s): CHOL, HDL, LDLCALC, TRIG, CHOLHDL, LDLDIRECT in the last 72 hours. Thyroid function studies: No results for input(s): TSH, T4TOTAL, T3FREE, THYROIDAB in the last 72 hours.  Invalid input(s): FREET3 Anemia work up: No results for input(s): VITAMINB12, FOLATE, FERRITIN, TIBC, IRON, RETICCTPCT in the last 72 hours. Sepsis Labs: Recent Labs  Lab 10/16/18 1310 10/16/18 1318 10/16/18 1518 10/17/18 1204  WBC 21.8*  --   --  21.9*  LATICACIDVEN  --  1.72 1.36  --    Microbiology Recent Results (from the past 240 hour(s))  Culture, blood (Routine x 2)     Status: None (Preliminary result)   Collection Time: 10/16/18 11:58 AM  Result Value Ref Range Status   Specimen Description BLOOD LEFT ANTECUBITAL  Final   Special Requests   Final    BOTTLES DRAWN AEROBIC AND ANAEROBIC Blood Culture results may not be optimal due to an excessive volume of blood received in culture bottles Performed at [facility] [address], [place], [place] [address]    Culture   Final    NO GROWTH < 12 HOURS Performed at [facility] [address] [place], [place] [address]    Report Status PENDING  Incomplete  Culture, blood (routine  x 2) Call MD if unable to obtain prior to antibiotics being given     Status: None (Preliminary result)   Collection Time: 10/16/18  4:43 PM  Result Value Ref Range Status   Specimen Description   Final    BLOOD LEFT HAND Performed at [facility] [address] [place], [place] [address]    Special Requests   Final    BOTTLES DRAWN AEROBIC AND ANAEROBIC Blood Culture results may not be optimal due to an inadequate volume of blood received in culture bottles Performed at [facility] [address] [place], [place] [address]    Culture   Final    NO GROWTH < 12 HOURS Performed at [facility] [address], [place], [place] [address]    Report Status PENDING  Incomplete  Medications:   . B-complex with vitamin C  1 tablet Oral Daily  . ferrous sulfate  325 mg Oral Daily  . loratadine  10 mg Oral Daily  . nystatin  5 mL Mouth/Throat QID  . rivaroxaban  10 mg Oral Daily  . vitamin B-12  2,000 mcg Oral Daily   Continuous Infusions: . ceFEPime (MAXIPIME) IV 1 g (10/18/18 0438)  . vancomycin 1,000 mg (10/17/18 2147)      LOS: 2 days   Charlynne Cousins  [facility]   *Please refer to [email or web address], password [email or web address] to get updated schedule on who will round on this patient, as hospitalists switch teams weekly. If 7PM-7AM, please contact night-coverage at [email or web address], password [email or web address] for any overnight needs.  10/18/2018, 7:32 AM

## 2018-10-19 ENCOUNTER — Inpatient Hospital Stay: Payer: Medicaid Other

## 2018-10-19 ENCOUNTER — Other Ambulatory Visit: Payer: Self-pay | Admitting: *Deleted

## 2018-10-19 ENCOUNTER — Encounter (HOSPITAL_COMMUNITY): Payer: Self-pay | Admitting: Family Medicine

## 2018-10-19 LAB — CBC
HCT: 27 % — ABNORMAL LOW (ref 36.0–46.0)
HEMOGLOBIN: 8.1 g/dL — AB (ref 12.0–15.0)
MCH: 26.7 pg (ref 26.0–34.0)
MCHC: 30 g/dL (ref 30.0–36.0)
MCV: 89.1 fL (ref 80.0–100.0)
Platelets: 358 10*3/uL (ref 150–400)
RBC: 3.03 MIL/uL — ABNORMAL LOW (ref 3.87–5.11)
RDW: 24.1 % — ABNORMAL HIGH (ref 11.5–15.5)
WBC: 13.6 10*3/uL — ABNORMAL HIGH (ref 4.0–10.5)
nRBC: 0.1 % (ref 0.0–0.2)

## 2018-10-19 LAB — BASIC METABOLIC PANEL
Anion gap: 8 (ref 5–15)
BUN: 9 mg/dL (ref 6–20)
CO2: 25 mmol/L (ref 22–32)
Calcium: 8.1 mg/dL — ABNORMAL LOW (ref 8.9–10.3)
Chloride: 104 mmol/L (ref 98–111)
Creatinine, Ser: 0.68 mg/dL (ref 0.44–1.00)
GFR calc Af Amer: >60 mL/min (ref >60)
GFR calc non Af Amer: >60 mL/min (ref >60)
Glucose, Bld: 175 mg/dL — ABNORMAL HIGH (ref 70–99)
Potassium: 3.5 mmol/L (ref 3.5–5.1)
Sodium: 137 mmol/L (ref 135–145)

## 2018-10-19 LAB — CREATININE, SERUM
Creatinine, Ser: 0.66 mg/dL (ref 0.44–1.00)
GFR calc non Af Amer: >60 mL/min (ref >60)

## 2018-10-19 MED ORDER — NYSTATIN 100000 UNIT/ML MT SUSP: 5.0000 mL | Freq: Four times a day (QID) | OROMUCOSAL | 0 refills | Status: AC

## 2018-10-19 MED ORDER — AZITHROMYCIN 500 MG PO TABS: 500.0000 mg | ORAL_TABLET | Freq: Every day | ORAL | 0 refills | Status: AC

## 2018-10-19 MED ORDER — ALBUTEROL SULFATE (2.5 MG/3ML) 0.083% IN NEBU: 2.5000 mg | INHALATION_SOLUTION | Freq: Four times a day (QID) | RESPIRATORY_TRACT | 12 refills | Status: AC | PRN

## 2018-10-19 MED ORDER — CEFDINIR 300 MG PO CAPS: 300.0000 mg | ORAL_CAPSULE | Freq: Two times a day (BID) | ORAL | 0 refills | Status: AC

## 2018-10-19 MED ORDER — ALBUTEROL SULFATE (2.5 MG/3ML) 0.083% IN NEBU
2.5000 mg | INHALATION_SOLUTION | Freq: Once | RESPIRATORY_TRACT | Status: AC
  Administered 2018-10-19: 2.5 mg via RESPIRATORY_TRACT
  Filled 2018-10-19: qty 3

## 2018-10-19 MED ORDER — GUAIFENESIN ER 600 MG PO TB12: 600.0000 mg | ORAL_TABLET | Freq: Two times a day (BID) | ORAL | Status: AC

## 2018-10-19 MED ORDER — HEPARIN SOD (PORK) LOCK FLUSH 100 UNIT/ML IV SOLN: 250.0000 [IU] | INTRAVENOUS | Status: DC | PRN

## 2018-10-19 MED ORDER — GUAIFENESIN ER 600 MG PO TB12
600.0000 mg | ORAL_TABLET | Freq: Two times a day (BID) | ORAL | Status: DC
  Administered 2018-10-19: 600 mg via ORAL
  Filled 2018-10-19: qty 1

## 2018-10-19 MED ORDER — ACETAMINOPHEN 325 MG PO TABS: 650.0000 mg | ORAL_TABLET | Freq: Four times a day (QID) | ORAL | 1 refills | Status: AC | PRN

## 2018-10-19 MED FILL — NYSTATIN 100,000 UNITS/ML S: 100000 | 3 days supply | Qty: 60 | Fill #0

## 2018-10-19 MED FILL — ALBUTEROL 0.083 MG/ML SOLN: (2.5 MG/3ML | 5 days supply | Qty: 90 | Fill #0

## 2018-10-19 MED FILL — CEFDINIR 300 MG CAPSULE: 300 | 7 days supply | Qty: 14 | Fill #0

## 2018-10-19 MED FILL — AZITHROMYCIN 500 MG TABLET: 500 | 5 days supply | Qty: 5 | Fill #0

## 2018-10-19 NOTE — Progress Notes (Addendum)
Pt discharged home in stable condition. Discharge instructions and script given. Scripts sent to pharmacy of choice. No immediate questions or concerns at this time. Pt discharged from unit via wheelchair.

## 2018-10-19 NOTE — Discharge Instructions (Signed)
1) you have been treated for pneumonia----please call or return if persistent fevers, worsening cough, worsening shortness of breath or any other concerns 2) follow-up with your oncologist as previously scheduled

## 2018-10-19 NOTE — Discharge Summary (Signed)
Cassandra Clark, is a 37 y.o. female  DOB 1981/04/30  MRN [id number].  Admission date:  10/16/2018  Admitting Physician  Charlynne Cousins, MD  Discharge Date:  10/19/2018   Primary MD  Patient, No Pcp Per  Recommendations for primary care physician for things to follow:   1) you have been treated for pneumonia----please call or return if persistent fevers, worsening cough, worsening shortness of breath or any other concerns 2) follow-up with your oncologist as previously scheduled   Admission Diagnosis  HCAP (healthcare-associated pneumonia) [J18.9]   Discharge Diagnosis  HCAP (healthcare-associated pneumonia) [J18.9]    Active Problems:   Hodgkin's lymphoma, adult ([place])   Healthcare-associated pneumonia   HCAP (healthcare-associated pneumonia)      Past Medical History:  Diagnosis Date  . Anemia     Past Surgical History:  Procedure Laterality Date  . PICC LINE INSERTION Right 08/18/2018       HPI  from the history and physical done on the day of admission:    Chief Complaint: Fever, SOB and cough   HPI: Cassandra Clark is a 37 y.o. female past medical history significant for hodgkin's lymphoma recently diagnosed in October 2019 on chemotherapy with her last chemotherapy at the cancer center here at [facility] last chemo on 10/04/2018 comes in for fever and cough and shortness of breath that started about 2 days prior to admission. She relates she took her temperature at home it was 102.7 the day prior to admission.  She relates she has shortness of breath with ambulation.  She denies any nausea vomiting diarrhea chest pain or sick contacts.  Review of Systems: All systems reviewed and apart from history of presenting illness, are negative.   Hospital Course:    Brief Summary Cassandra Clark is an 37 y.o. female past medical history significant for Hodgkin's lymphoma  recently diagnosed in October 2019, with her last chemotherapy and 10/04/2018 comes in for cough and shortness of breath that started 2 days prior to admission, chest x-ray was done she had a mild leukocytosis and fever is being treated for pneumonia.  Plan:-  1)HCAP-improved significantly on cefepime and vancomycin, no further fevers (afebrile for over 40 hours),  no chills, no night sweats, WBC is down to 13.6 from 21.9, cultures negative so far, post ambulation O2 sats reassuring... 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 Irene Limbo appreciated,  further management by oncologist  3) DVT prophylaxis----as per oncologist patient is on Xarelto 10 mg daily for DVT prophylaxis  4) chronic anemia--- due to underlying malignancy and chemo treatments hemoglobin is stable above 8,  follow-up with oncologist for recheck and  reevaluation  Discharge Condition: stable  Follow UP  Consults obtained - oncology  Diet and Activity recommendation:  As advised  Discharge Instructions   Discharge Instructions    Call MD for:  difficulty breathing, headache or visual disturbances   Complete by:  As directed    Call MD for:  persistant dizziness or light-headedness   Complete by:  As directed    Call MD for:  persistant nausea and vomiting   Complete by:  As directed    Call MD for:  severe uncontrolled pain   Complete by:  As directed    Call MD for:  temperature >100.4   Complete by:  As directed    DME Nebulizer/meds   Complete by:  As  directed    Patient needs a nebulizer to treat with the following condition:  Dyspnea and respiratory abnormalities   Diet - low sodium heart healthy   Complete by:  As directed    Discharge instructions   Complete by:  As directed    1) you have been treated for pneumonia----please call or return if persistent fevers, worsening cough, worsening shortness of breath or any other concerns 2) follow-up with your oncologist as previously scheduled   Increase activity slowly   Complete by:  As directed         Discharge Medications     Allergies as of 10/19/2018      Reactions   Penicillins Hives   Has patient had a PCN reaction causing immediate rash, facial/tongue/throat swelling, SOB or lightheadedness with hypotension: Unknown Has patient had a PCN reaction causing severe rash involving mucus membranes or skin necrosis: Unknown Has patient had a PCN reaction that required hospitalization: Unknown Has patient had a PCN reaction occurring within the last 10 years: Unknown If all of the above answers are "NO", then may proceed with Cephalosporin use.      Medication List    TAKE these medications   acetaminophen 325 MG tablet Commonly known as:  TYLENOL Take 2 tablets (650 mg total) by mouth every 6 (six) hours as needed for mild pain, fever or headache.   albuterol (2.5 MG/3ML) 0.083% nebulizer solution Commonly known as:  PROVENTIL Take 3 mLs (2.5 mg total) by nebulization every 6 (six) hours as needed for wheezing or shortness of breath.   azithromycin 500 MG tablet Commonly known as:  ZITHROMAX Take 1 tablet (500 mg total) by mouth daily for 5 days.   B-12 1000 MCG Subl Place 2,000 mcg under the tongue daily.   B-complex with vitamin C tablet Take 1 tablet by mouth daily.   cefdinir 300 MG capsule Commonly known as:  OMNICEF Take 1 capsule (300 mg total) by mouth 2 (two) times daily for 7 days.   dexamethasone 4 MG tablet Commonly known as:  DECADRON Take 2  tablets by mouth once a day on the day after chemotherapy and then take 2 tablets two times a day for 2 days. Take with food.   ferrous sulfate 325 (65 FE) MG tablet Take 1 tablet (325 mg total) by mouth daily.   guaiFENesin 600 MG 12 hr tablet Commonly known as:  MUCINEX Take 1 tablet (600 mg total) by mouth 2 (two) times daily.   HYDROcodone-acetaminophen 5-325 MG tablet Commonly known as:  NORCO Take 1-2 tablets by mouth every 6 (six) hours as needed for moderate pain or severe pain.   lidocaine-prilocaine cream Commonly known as:  EMLA Apply to affected area once  loratadine 10 MG tablet Commonly known as:  CLARITIN Take 10 mg by mouth daily.   LORazepam 0.5 MG tablet Commonly known as:  ATIVAN Take 1 tablet (0.5 mg total) by mouth every 6 (six) hours as needed (Nausea or vomiting).   nystatin 100000 UNIT/ML suspension Commonly known as:  MYCOSTATIN Use as directed 5 mLs (500,000 Units total) in the mouth or throat 4 (four) times daily.   ondansetron 8 MG tablet Commonly known as:  ZOFRAN Take 1 tablet (8 mg total) by mouth 2 (two) times daily as needed. Start on the third day after chemotherapy.   prochlorperazine 10 MG tablet Commonly known as:  COMPAZINE Take 1 tablet (10 mg total) by mouth every 6 (six) hours as needed (Nausea or vomiting).   rivaroxaban 10 MG Tabs tablet Commonly known as:  XARELTO Take 1 tablet (10 mg total) by mouth daily.            Durable Medical Equipment  (From admission, onward)         Start     Ordered   10/19/18 0000  DME Nebulizer/meds    Question:  Patient needs a nebulizer to treat with the following condition  Answer:  Dyspnea and respiratory abnormalities   10/19/18 1333          Major procedures and Radiology Reports - PLEASE review detailed and final reports for all details, in brief -   Dg Chest 2 View  Result Date: 10/16/2018 CLINICAL DATA:  Cough and fever. Undergoing chemotherapy for Hodgkin's lymphoma.  EXAM: CHEST - 2 VIEW COMPARISON:  08/03/2018 FINDINGS: Heart size is normal. Right arm PICC line is seen in appropriate position. New streaky opacity is seen in both lung bases, right side greater than left, which may be due to atelectasis or infiltrates. No evidence of pulmonary edema or pleural effusion. Previously seen anterior mediastinal mass is no longer visualized. IMPRESSION: Right greater than left basilar atelectasis versus infiltrate. Significant decrease in size of anterior mediastinal mass since prior study. Electronically Signed   By: Earle Gell M.D.   On: 10/16/2018 12:43    Micro Results    Recent Results (from the past 240 hour(s))  Culture, blood (Routine x 2)     Status: None (Preliminary result)   Collection Time: 10/16/18 11:58 AM  Result Value Ref Range Status   Specimen Description BLOOD LEFT ANTECUBITAL  Final   Special Requests   Final    BOTTLES DRAWN AEROBIC AND ANAEROBIC Blood Culture results may not be optimal due to an excessive volume of blood received in culture bottles Performed at [facility], [place] [address], [place], [place] [address]    Culture   Final    NO GROWTH 3 DAYS Performed at [facility], [place] [address], [place], [place] [address]    Report Status PENDING  Incomplete  Culture, blood (routine x 2) Call MD if unable to obtain prior to antibiotics being given     Status: None (Preliminary result)   Collection Time: 10/16/18  4:43 PM  Result Value Ref Range Status   Specimen Description   Final    BLOOD LEFT HAND Performed at [place] [address], [place], [place] [address]    Special Requests   Final    BOTTLES DRAWN AEROBIC AND ANAEROBIC Blood Culture results may not be optimal due to an inadequate volume of blood received in culture bottles Performed at [facility]  [address] [place], [place] [address]    Culture   Final    NO GROWTH 3 DAYS Performed at [facility], [place] [address] [place], [place] [address]    Report Status PENDING  Incomplete       Today   Subjective    Cassandra Clark today has no new complaints, no significant shortness of breath or cough, no chest pains and palpitations          Patient has been seen and examined prior to discharge   Objective   Blood pressure (!) 96/54, pulse (!) 120, temperature 98.7 F (37.1 C), temperature source Oral, resp. rate 16, SpO2 93 %.   Intake/Output Summary (Last 24 hours) at 10/19/2018 1356 Last data filed at 10/19/2018 0533 Gross per 24 hour  Intake 2452.06 ml  Output -  Net 2452.06 ml    Exam Gen:- Awake Alert, no acute distress  HEENT:- [place].AT, No sclera icterus Neck-Supple Neck,No JVD,.  Lungs - Fair air movement, no wheezing, very few scattered rhonchi, no rales CV- S1, S2 normal, regular Abd-  +ve B.Sounds, Abd Soft, No tenderness,    Extremity/Skin:- No  edema,   good pulses Psych-affect is appropriate, oriented x3 Neuro-no new focal deficits, no tremors    Data Review   CBC w Diff:  Lab Results  Component Value Date   WBC 13.6 (H) 10/19/2018   HGB 8.1 (L) 10/19/2018   HGB 10.5 (L) 08/30/2018   HCT 27.0 (L) 10/19/2018   PLT 358 10/19/2018   PLT 221 08/30/2018   LYMPHOPCT 4 10/18/2018   BANDSPCT 4 08/30/2018   MONOPCT 5 10/18/2018   EOSPCT 1 10/18/2018   BASOPCT 0 10/18/2018    CMP:  Lab Results  Component Value Date   NA 137 10/19/2018   K 3.5 10/19/2018   CL 104 10/19/2018   CO2 25 10/19/2018   BUN 9 10/19/2018   CREATININE 0.68 10/19/2018   CREATININE 0.78 10/04/2018   PROT 6.2 (L) 10/16/2018   ALBUMIN 3.4 (L) 10/16/2018   BILITOT 0.6 10/16/2018   BILITOT 0.2 (L) 10/04/2018   ALKPHOS 95 10/16/2018   AST 22 10/16/2018   AST 18 10/04/2018   ALT 25 10/16/2018   ALT 28 10/04/2018  .  Total Discharge time is about 33 minutes  Roxan Hockey M.D on 10/19/2018 at 1:56 PM  Pager---[phone]  Go to [email or web address] - password  [email or web address] for contact info  [facility] - Office  [phone]

## 2018-10-19 NOTE — Progress Notes (Signed)
Pharmacy Antibiotic Note  Cassandra Clark is a 37 y.o. female with Hodgkin's lymphoma on chemotherapy PTA, presented to the ED on 10/16/2018 with cough. Patient's currently on vancomycin and cefepime for suspected sepsis/PNA.  Today, 10/19/2018: - day #3 abx - All cultures have been negative thus far - Tmax 99.4, wbc 18.9 on 12/23 - scr 0.66 (crcl~100), ANC 16.1   Plan: - continue Vancomycin 1000 mg IV q12h  - Cefepime 1 gm IV q8h  - pharmacy will plan on checking vancomycin levels if to continue with abx for >7 days  __________________________________________  Temp (24hrs), Avg:98.7 F (37.1 C), Min:98 F (36.7 C), Max:99.4 F (37.4 C)  Recent Labs  Lab 10/16/18 1310 10/16/18 1318 10/16/18 1518 10/17/18 1204 10/18/18 0749 10/19/18 0436  WBC 21.8*  --   --  21.9* 18.9*  --   CREATININE 0.80  --   --   --   --  0.66  LATICACIDVEN  --  1.72 1.36  --   --   --     CrCl cannot be calculated (Unknown ideal weight.).    Allergies  Allergen Reactions  . Penicillins Hives    Has patient had a PCN reaction causing immediate rash, facial/tongue/throat swelling, SOB or lightheadedness with hypotension: Unknown Has patient had a PCN reaction causing severe rash involving mucus membranes or skin necrosis: Unknown Has patient had a PCN reaction that required hospitalization: Unknown Has patient had a PCN reaction occurring within the last 10 years: Unknown If all of the above answers are "NO", then may proceed with Cephalosporin use.     Antimicrobials this admission:  12/21 aztreonam x 1 in ED 12/21 cefepime >>  12/21 vancomycin >>  12/21 metronidazole >> 12/22   Microbiology results:  12/21 BCx x2:  12/21 HIV antibody: NR 12/21 Strep pneumo antigen: neg   Thank you for allowing pharmacy to be a part of this patient's care.  Lynelle Doctor 10/19/2018 7:44 AM

## 2018-10-21 ENCOUNTER — Ambulatory Visit: Payer: Self-pay

## 2018-10-21 ENCOUNTER — Inpatient Hospital Stay: Payer: Medicaid Other

## 2018-10-21 ENCOUNTER — Other Ambulatory Visit: Payer: Self-pay

## 2018-10-21 LAB — CULTURE, BLOOD (ROUTINE X 2)
Culture: NO GROWTH
Culture: NO GROWTH
Culture: NO GROWTH

## 2018-10-25 ENCOUNTER — Inpatient Hospital Stay: Payer: Medicaid Other

## 2018-10-25 DIAGNOSIS — D649 Anemia, unspecified: Secondary | ICD-10-CM | POA: Diagnosis not present

## 2018-10-25 DIAGNOSIS — C8198 Hodgkin lymphoma, unspecified, lymph nodes of multiple sites: Secondary | ICD-10-CM

## 2018-10-25 DIAGNOSIS — Z7901 Long term (current) use of anticoagulants: Secondary | ICD-10-CM | POA: Diagnosis not present

## 2018-10-25 DIAGNOSIS — I871 Compression of vein: Secondary | ICD-10-CM | POA: Diagnosis not present

## 2018-10-25 DIAGNOSIS — E538 Deficiency of other specified B group vitamins: Secondary | ICD-10-CM | POA: Diagnosis not present

## 2018-10-25 DIAGNOSIS — Z95828 Presence of other vascular implants and grafts: Secondary | ICD-10-CM

## 2018-10-25 DIAGNOSIS — Z79899 Other long term (current) drug therapy: Secondary | ICD-10-CM | POA: Diagnosis not present

## 2018-10-25 DIAGNOSIS — Z5112 Encounter for antineoplastic immunotherapy: Secondary | ICD-10-CM | POA: Diagnosis not present

## 2018-10-25 DIAGNOSIS — Z5111 Encounter for antineoplastic chemotherapy: Secondary | ICD-10-CM | POA: Diagnosis not present

## 2018-10-25 LAB — CBC WITH DIFFERENTIAL/PLATELET
Abs Immature Granulocytes: 0.04 10*3/uL (ref 0.00–0.07)
BASOS PCT: 1 %
Basophils Absolute: 0.1 10*3/uL (ref 0.0–0.1)
EOS ABS: 0.5 10*3/uL (ref 0.0–0.5)
Eosinophils Relative: 7 %
HCT: 28.9 % — ABNORMAL LOW (ref 36.0–46.0)
Hemoglobin: 8.8 g/dL — ABNORMAL LOW (ref 12.0–15.0)
Immature Granulocytes: 1 %
Lymphocytes Relative: 27 %
Lymphs Abs: 1.8 10*3/uL (ref 0.7–4.0)
MCH: 27.2 pg (ref 26.0–34.0)
MCHC: 30.4 g/dL (ref 30.0–36.0)
MCV: 89.5 fL (ref 80.0–100.0)
Monocytes Absolute: 0.4 10*3/uL (ref 0.1–1.0)
Monocytes Relative: 6 %
Neutro Abs: 3.9 10*3/uL (ref 1.7–7.7)
Neutrophils Relative %: 58 %
Platelets: 714 10*3/uL — ABNORMAL HIGH (ref 150–400)
RBC: 3.23 MIL/uL — ABNORMAL LOW (ref 3.87–5.11)
RDW: 23.9 % — ABNORMAL HIGH (ref 11.5–15.5)
WBC: 6.7 10*3/uL (ref 4.0–10.5)
nRBC: 0 % (ref 0.0–0.2)

## 2018-10-25 LAB — CMP (CANCER CENTER ONLY)
ALT: 23 U/L (ref 0–44)
AST: 26 U/L (ref 15–41)
Albumin: 3.1 g/dL — ABNORMAL LOW (ref 3.5–5.0)
Alkaline Phosphatase: 57 U/L (ref 38–126)
Anion gap: 9 (ref 5–15)
BUN: 7 mg/dL (ref 6–20)
CALCIUM: 8.9 mg/dL (ref 8.9–10.3)
CO2: 23 mmol/L (ref 22–32)
CREATININE: 0.66 mg/dL (ref 0.44–1.00)
Chloride: 108 mmol/L (ref 98–111)
GFR, Est AFR Am: >60 mL/min (ref >60)
GFR, Estimated: >60 mL/min (ref >60)
Glucose, Bld: 90 mg/dL (ref 70–99)
Potassium: 3.6 mmol/L (ref 3.5–5.1)
Sodium: 140 mmol/L (ref 135–145)
TOTAL PROTEIN: 6.8 g/dL (ref 6.5–8.1)
Total Bilirubin: <0.2 mg/dL — ABNORMAL LOW (ref 0.3–1.2)

## 2018-10-25 MED ORDER — SODIUM CHLORIDE 0.9% FLUSH
10.0000 mL | INTRAVENOUS | Status: DC | PRN
  Administered 2018-10-25: 10 mL
  Filled 2018-10-25: qty 10

## 2018-10-25 MED ORDER — HEPARIN SOD (PORK) LOCK FLUSH 100 UNIT/ML IV SOLN
500.0000 [IU] | Freq: Once | INTRAVENOUS | Status: AC | PRN
  Administered 2018-10-25: 250 [IU]
  Filled 2018-10-25: qty 5

## 2018-10-28 ENCOUNTER — Encounter: Payer: Self-pay | Admitting: *Deleted

## 2018-10-28 ENCOUNTER — Inpatient Hospital Stay: Payer: Medicaid Other

## 2018-10-28 ENCOUNTER — Inpatient Hospital Stay: Payer: Medicaid Other | Attending: Hematology

## 2018-10-28 DIAGNOSIS — D510 Vitamin B12 deficiency anemia due to intrinsic factor deficiency: Secondary | ICD-10-CM | POA: Diagnosis not present

## 2018-10-28 DIAGNOSIS — Z5112 Encounter for antineoplastic immunotherapy: Secondary | ICD-10-CM | POA: Diagnosis present

## 2018-10-28 DIAGNOSIS — Z95828 Presence of other vascular implants and grafts: Secondary | ICD-10-CM

## 2018-10-28 DIAGNOSIS — Z7901 Long term (current) use of anticoagulants: Secondary | ICD-10-CM | POA: Insufficient documentation

## 2018-10-28 DIAGNOSIS — C8198 Hodgkin lymphoma, unspecified, lymph nodes of multiple sites: Secondary | ICD-10-CM | POA: Insufficient documentation

## 2018-10-28 DIAGNOSIS — D509 Iron deficiency anemia, unspecified: Secondary | ICD-10-CM | POA: Diagnosis not present

## 2018-10-28 DIAGNOSIS — Z5111 Encounter for antineoplastic chemotherapy: Secondary | ICD-10-CM | POA: Insufficient documentation

## 2018-10-28 DIAGNOSIS — Z79899 Other long term (current) drug therapy: Secondary | ICD-10-CM | POA: Insufficient documentation

## 2018-10-28 LAB — CMP (CANCER CENTER ONLY)
ALT: 35 U/L (ref 0–44)
AST: 31 U/L (ref 15–41)
Albumin: 3.3 g/dL — ABNORMAL LOW (ref 3.5–5.0)
Alkaline Phosphatase: 58 U/L (ref 38–126)
Anion gap: 8 (ref 5–15)
BUN: 10 mg/dL (ref 6–20)
CO2: 25 mmol/L (ref 22–32)
Calcium: 9.4 mg/dL (ref 8.9–10.3)
Chloride: 107 mmol/L (ref 98–111)
Creatinine: 0.71 mg/dL (ref 0.44–1.00)
GFR, Est AFR Am: >60 mL/min (ref >60)
GFR, Estimated: >60 mL/min (ref >60)
Glucose, Bld: 88 mg/dL (ref 70–99)
Potassium: 3.8 mmol/L (ref 3.5–5.1)
Sodium: 140 mmol/L (ref 135–145)
Total Bilirubin: 0.2 mg/dL — ABNORMAL LOW (ref 0.3–1.2)
Total Protein: 7 g/dL (ref 6.5–8.1)

## 2018-10-28 LAB — CBC WITH DIFFERENTIAL/PLATELET
Abs Immature Granulocytes: 0.01 10*3/uL (ref 0.00–0.07)
Basophils Absolute: 0 10*3/uL (ref 0.0–0.1)
Basophils Relative: 1 %
Eosinophils Absolute: 0.3 10*3/uL (ref 0.0–0.5)
Eosinophils Relative: 5 %
HCT: 31 % — ABNORMAL LOW (ref 36.0–46.0)
Hemoglobin: 9.4 g/dL — ABNORMAL LOW (ref 12.0–15.0)
Immature Granulocytes: 0 %
LYMPHS ABS: 1.7 10*3/uL (ref 0.7–4.0)
Lymphocytes Relative: 29 %
MCH: 27.1 pg (ref 26.0–34.0)
MCHC: 30.3 g/dL (ref 30.0–36.0)
MCV: 89.3 fL (ref 80.0–100.0)
Monocytes Absolute: 0.4 10*3/uL (ref 0.1–1.0)
Monocytes Relative: 7 %
Neutro Abs: 3.4 10*3/uL (ref 1.7–7.7)
Neutrophils Relative %: 58 %
Platelets: 653 10*3/uL — ABNORMAL HIGH (ref 150–400)
RBC: 3.47 MIL/uL — ABNORMAL LOW (ref 3.87–5.11)
RDW: 23.9 % — ABNORMAL HIGH (ref 11.5–15.5)
WBC: 5.8 10*3/uL (ref 4.0–10.5)
nRBC: 0 % (ref 0.0–0.2)

## 2018-10-28 MED ORDER — SODIUM CHLORIDE 0.9% FLUSH
10.0000 mL | INTRAVENOUS | Status: DC | PRN
  Administered 2018-10-28: 10 mL
  Filled 2018-10-28: qty 10

## 2018-10-28 MED ORDER — HEPARIN SOD (PORK) LOCK FLUSH 100 UNIT/ML IV SOLN
500.0000 [IU] | Freq: Once | INTRAVENOUS | Status: AC | PRN
  Administered 2018-10-28: 500 [IU]
  Filled 2018-10-28: qty 5

## 2018-10-28 NOTE — Progress Notes (Addendum)
Patient  Received letter requiring her to report for [facility] on November 01, 2018. She also has chemotherapy scheduled that day. Per Dr.kale, please give letter asking for her to be excused from jury duty while in treatment. Letter written and given to patient.

## 2018-11-01 ENCOUNTER — Telehealth: Payer: Self-pay | Admitting: *Deleted

## 2018-11-01 ENCOUNTER — Inpatient Hospital Stay: Payer: Medicaid Other

## 2018-11-01 ENCOUNTER — Inpatient Hospital Stay (HOSPITAL_BASED_OUTPATIENT_CLINIC_OR_DEPARTMENT_OTHER): Payer: Medicaid Other | Admitting: Hematology

## 2018-11-01 VITALS — HR 97

## 2018-11-01 VITALS — BP 124/67 | HR 105 | Temp 97.8°F | Resp 18 | Ht 67.0 in | Wt 166.0 lb

## 2018-11-01 DIAGNOSIS — Z5112 Encounter for antineoplastic immunotherapy: Secondary | ICD-10-CM | POA: Diagnosis not present

## 2018-11-01 DIAGNOSIS — C8190 Hodgkin lymphoma, unspecified, unspecified site: Secondary | ICD-10-CM

## 2018-11-01 DIAGNOSIS — D649 Anemia, unspecified: Secondary | ICD-10-CM

## 2018-11-01 DIAGNOSIS — E538 Deficiency of other specified B group vitamins: Secondary | ICD-10-CM | POA: Diagnosis not present

## 2018-11-01 DIAGNOSIS — C8198 Hodgkin lymphoma, unspecified, lymph nodes of multiple sites: Secondary | ICD-10-CM

## 2018-11-01 DIAGNOSIS — Z7189 Other specified counseling: Secondary | ICD-10-CM

## 2018-11-01 DIAGNOSIS — I871 Compression of vein: Secondary | ICD-10-CM

## 2018-11-01 DIAGNOSIS — Z95828 Presence of other vascular implants and grafts: Secondary | ICD-10-CM

## 2018-11-01 LAB — CBC WITH DIFFERENTIAL/PLATELET
Abs Immature Granulocytes: 0.01 10*3/uL (ref 0.00–0.07)
Basophils Absolute: 0 10*3/uL (ref 0.0–0.1)
Basophils Relative: 1 %
Eosinophils Absolute: 0.2 10*3/uL (ref 0.0–0.5)
Eosinophils Relative: 5 %
HCT: 32 % — ABNORMAL LOW (ref 36.0–46.0)
Hemoglobin: 9.6 g/dL — ABNORMAL LOW (ref 12.0–15.0)
Immature Granulocytes: 0 %
Lymphocytes Relative: 29 %
Lymphs Abs: 1.5 10*3/uL (ref 0.7–4.0)
MCH: 27.1 pg (ref 26.0–34.0)
MCHC: 30 g/dL (ref 30.0–36.0)
MCV: 90.4 fL (ref 80.0–100.0)
Monocytes Absolute: 0.4 10*3/uL (ref 0.1–1.0)
Monocytes Relative: 8 %
NEUTROS ABS: 3 10*3/uL (ref 1.7–7.7)
Neutrophils Relative %: 57 %
PLATELETS: 584 10*3/uL — AB (ref 150–400)
RBC: 3.54 MIL/uL — ABNORMAL LOW (ref 3.87–5.11)
RDW: 22.5 % — ABNORMAL HIGH (ref 11.5–15.5)
WBC: 5.1 10*3/uL (ref 4.0–10.5)
nRBC: 0 % (ref 0.0–0.2)

## 2018-11-01 LAB — CMP (CANCER CENTER ONLY)
ALT: 32 U/L (ref 0–44)
AST: 30 U/L (ref 15–41)
Albumin: 3.3 g/dL — ABNORMAL LOW (ref 3.5–5.0)
Alkaline Phosphatase: 66 U/L (ref 38–126)
Anion gap: 7 (ref 5–15)
BILIRUBIN TOTAL: 0.2 mg/dL — AB (ref 0.3–1.2)
BUN: 12 mg/dL (ref 6–20)
CO2: 26 mmol/L (ref 22–32)
Calcium: 9.1 mg/dL (ref 8.9–10.3)
Chloride: 108 mmol/L (ref 98–111)
Creatinine: 0.68 mg/dL (ref 0.44–1.00)
GFR, Est AFR Am: >60 mL/min (ref >60)
GFR, Estimated: >60 mL/min (ref >60)
Glucose, Bld: 93 mg/dL (ref 70–99)
POTASSIUM: 3.9 mmol/L (ref 3.5–5.1)
Sodium: 141 mmol/L (ref 135–145)
Total Protein: 7.3 g/dL (ref 6.5–8.1)

## 2018-11-01 LAB — SAMPLE TO BLOOD BANK

## 2018-11-01 LAB — MAGNESIUM: Magnesium: 1.9 mg/dL (ref 1.7–2.4)

## 2018-11-01 MED ORDER — VINBLASTINE SULFATE CHEMO INJECTION 1 MG/ML
6.0000 mg/m2 | Freq: Once | INTRAVENOUS | Status: AC
  Administered 2018-11-01: 11 mg via INTRAVENOUS
  Filled 2018-11-01: qty 11

## 2018-11-01 MED ORDER — SODIUM CHLORIDE 0.9% FLUSH
10.0000 mL | INTRAVENOUS | Status: DC | PRN
  Administered 2018-11-01: 10 mL
  Filled 2018-11-01: qty 10

## 2018-11-01 MED ORDER — HEPARIN SOD (PORK) LOCK FLUSH 100 UNIT/ML IV SOLN
250.0000 [IU] | Freq: Once | INTRAVENOUS | Status: AC | PRN
  Administered 2018-11-01: 250 [IU]
  Filled 2018-11-01: qty 5

## 2018-11-01 MED ORDER — DIPHENHYDRAMINE HCL 25 MG PO CAPS
ORAL_CAPSULE | ORAL | Status: AC
  Filled 2018-11-01: qty 1

## 2018-11-01 MED ORDER — SODIUM CHLORIDE 0.9 % IV SOLN
1.2000 mg/kg | Freq: Once | INTRAVENOUS | Status: AC
  Administered 2018-11-01: 85 mg via INTRAVENOUS
  Filled 2018-11-01: qty 17

## 2018-11-01 MED ORDER — SODIUM CHLORIDE 0.9 % IV SOLN
375.0000 mg/m2 | Freq: Once | INTRAVENOUS | Status: AC
  Administered 2018-11-01: 690 mg via INTRAVENOUS
  Filled 2018-11-01: qty 69

## 2018-11-01 MED ORDER — ACETAMINOPHEN 325 MG PO TABS
ORAL_TABLET | ORAL | Status: AC
  Filled 2018-11-01: qty 2

## 2018-11-01 MED ORDER — ACETAMINOPHEN 325 MG PO TABS
650.0000 mg | ORAL_TABLET | Freq: Once | ORAL | Status: AC
  Administered 2018-11-01: 650 mg via ORAL

## 2018-11-01 MED ORDER — DIPHENHYDRAMINE HCL 25 MG PO TABS
25.0000 mg | ORAL_TABLET | Freq: Once | ORAL | Status: AC
  Administered 2018-11-01: 25 mg via ORAL
  Filled 2018-11-01: qty 1

## 2018-11-01 MED ORDER — PALONOSETRON HCL INJECTION 0.25 MG/5ML
INTRAVENOUS | Status: AC
  Filled 2018-11-01: qty 5

## 2018-11-01 MED ORDER — SODIUM CHLORIDE 0.9 % IV SOLN
Freq: Once | INTRAVENOUS | Status: AC
  Administered 2018-11-01: 14:00:00 via INTRAVENOUS
  Filled 2018-11-01: qty 250

## 2018-11-01 MED ORDER — SODIUM CHLORIDE 0.9 % IV SOLN
Freq: Once | INTRAVENOUS | Status: AC
  Administered 2018-11-01: 15:00:00 via INTRAVENOUS
  Filled 2018-11-01: qty 5

## 2018-11-01 MED ORDER — PALONOSETRON HCL INJECTION 0.25 MG/5ML
0.2500 mg | Freq: Once | INTRAVENOUS | Status: AC
  Administered 2018-11-01: 0.25 mg via INTRAVENOUS

## 2018-11-01 MED ORDER — DOXORUBICIN HCL CHEMO IV INJECTION 2 MG/ML
25.0000 mg/m2 | Freq: Once | INTRAVENOUS | Status: AC
  Administered 2018-11-01: 46 mg via INTRAVENOUS
  Filled 2018-11-01: qty 23

## 2018-11-01 NOTE — Progress Notes (Signed)
HEMATOLOGY/ONCOLOGY CONSULTATION NOTE  Date of Service: 11/01/2018  Patient Care Team: Patient, No Pcp Per as PCP - General (General Practice)  CHIEF COMPLAINTS/PURPOSE OF CONSULTATION:  F/u for Mx of Hodgkins lymphoma  HISTORY OF PRESENTING ILLNESS:   Cassandra Clark is a wonderful 38 y.o. female who has been referred to [place] by Dr. 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   Patient has a mild anemia and presented to the emergency room with new onset fevers chills and a 2-week history of worsening night sweats dry cough fatigue and anorexia.  Patient notes that she initially started feeling unwell from late June when she had noted increasing fatigue that required her to rest for longer periods of time.  She also noted that she at times felt lightheaded and dizzy when she bent forward.  She notes a decreased appetite.  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 She notes that she has not been able to do much of anything else other than rest at home and work to some degree at her job.  No chest pain no palpitations no diaphoresis no lower extremity edema.  No abdominal pain no nausea no vomiting no diarrhea.  No rectal bleeding. Notes that her periods have been regular.  In the emergency room the patient was noted to have a fever of 102.4 F and tachycardia related to this.  Blood counts showed that the patient was anemic with a hemoglobin around 8 with normal platelets and a WBC count of 3.6k.  Magnesium 1.9 and phosphorus of 2. She was also noted to be vitamin B12 deficient.  Most recent lab results (08/04/18) of CBC w/diff and CMP is as follows: all values are WNL except for WBC at 3.0k RBC at 3.64, HGB at 8.3, HCT at 82.7, MCV at 78.8, MCH at 22.8, MCHC at 28.9, RDW at 18.1, PLT at 459k, Lymphs abs at 300,  Calcium at 8.0, Albumin at 2.7. 08/04/18 LDH at 203 08/04/18 Vitamin B12 at 135  X-ray of the chest done on 08/03/2018 showed Right paratracheal and left AP window masses likely representing lymphadenopathy. Consider CT chest for further characterization.  CT of the chest with contrast was subsequently done which showed 1. 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 In addition there are multiple nodular lesions in the spleen and possibly in the liver, as well as permeative destruction of the sternal manubrium with tumor rind anterior and posterior to the sternum. 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 Left-sided small cell lung cancer might give a similar appearance. Aggressive germ-cell tumor or thymic carcinoma or less likely differential diagnostic considerations. Tissue diagnosis recommended. 2. The anterior mediastinal mass is causing narrowing of the SVC, and mild invasion of the SVC with slight marginal thrombosis is not readily excluded. No large pulmonary embolus is identified.  Patient had additional lab testing which showed a near normal LDH was 203. Sedimentation rate was done subsequently and was noted to be elevated.  Urine pregnancy test was within normal limits.  Patient was noted to have significant headaches over the last 4 to 6 weeks and had a CT of the head without contrast in the ED on 07/04/2018 which showed  no acute intracranial normalities.  On review of systems, pt reports fevers, unquantified weight loss, shortness of breath, night sweats and denies overt chest pain, rashes.   Interval History:   Cassandra Clark returns today for management, evaluation and C3D15 of her A-AVD treatment of her Hodgkin's Lymphoma. I last saw the patient on 10/18/18 as an inpatient. The pt reports that she is doing well  overall.   The pt reports that her fevers have subsided, she has no remaining SOB and has only a mild, rare cough remaining. She hasn't had fevers in more than 5 days. The pt notes that her energy levels have improved and she is eating well again. She is moving her bowels well, denies overt diarrhea.   On review of systems, pt reports mild rare cough, good energy levels, eating well, moving her bowels well, and denies fevers, SOB, abdominal pains, chest pain, back pains, and any other symptoms.   MEDICAL HISTORY:  Past Medical History:  Diagnosis Date  . Anemia     SURGICAL HISTORY: Past Surgical History:  Procedure Laterality Date  . PICC LINE INSERTION Right 08/18/2018    SOCIAL HISTORY: Social History   Socioeconomic History  . Marital status: Legally Separated    Spouse name: Not on file  . Number of children: Not on file  . Years of education: Not on file  . Highest education level: Not on file  Occupational History  . Not on file  Social Needs  . Financial resource strain: Not on file  . Food insecurity:    Worry: Not on file    Inability: Not on file  . Transportation needs:    Medical: Not on file    Non-medical: Not on file  Tobacco Use  . Smoking status: Never Smoker  . Smokeless tobacco: Never Used  Substance and Sexual Activity  . Alcohol use: No  . Drug use: No    Comment: Drinks tea  . Sexual activity: Yes  Lifestyle  . Physical activity:    Days per week: Not on file    Minutes per session: Not on file  . Stress: Not on file  Relationships  . Social connections:    Talks on phone: Not on file    Gets together: Not on file    Attends religious service: Not on file    Active member of club or organization: Not on file    Attends meetings of clubs or organizations: Not on file    Relationship status: Not on file  . Intimate partner violence:    Fear of current or ex partner: Not on file    Emotionally abused: Not on file    Physically abused:  Not on file    Forced sexual activity: Not on file  Other Topics Concern  . Not on file  Social History Narrative  . Not on file    FAMILY HISTORY: Family History  Problem Relation Age of Onset  . Diabetes Mellitus II Father   . Hypertension Father   . Congestive Heart Failure Maternal Grandmother   . Diabetes Mellitus II Maternal Grandmother   . Diabetes Mellitus II Maternal Uncle   . Congestive Heart Failure Maternal Uncle     ALLERGIES:  is allergic to penicillins.  MEDICATIONS:  Current Outpatient Medications  Medication Sig Dispense Refill  . acetaminophen (TYLENOL) 325 MG tablet Take 2 tablets (650 mg total) by mouth every 6 (six) hours as needed for mild pain, fever or headache. 30 tablet  1  . albuterol (PROVENTIL) (2.5 MG/3ML) 0.083% nebulizer solution Take 3 mLs (2.5 mg total) by nebulization every 6 (six) hours as needed for wheezing or shortness of breath. 75 mL 12  . B Complex-C (B-COMPLEX WITH VITAMIN C) tablet Take 1 tablet by mouth daily. 30 tablet 3  . Cyanocobalamin (B-12) 1000 MCG SUBL Place 2,000 mcg under the tongue daily. 60 each 3  . dexamethasone (DECADRON) 4 MG tablet Take 2 tablets by mouth once a day on the day after chemotherapy and then take 2 tablets two times a day for 2 days. Take with food. 30 tablet 1  . ferrous sulfate 325 (65 FE) MG tablet Take 1 tablet (325 mg total) by mouth daily. (Patient not taking: Reported on 08/23/2018) 14 tablet 0  . guaiFENesin (MUCINEX) 600 MG 12 hr tablet Take 1 tablet (600 mg total) by mouth 2 (two) times daily. 20 tablet o  . HYDROcodone-acetaminophen (NORCO) 5-325 MG tablet Take 1-2 tablets by mouth every 6 (six) hours as needed for moderate pain or severe pain. 30 tablet 0  . lidocaine-prilocaine (EMLA) cream Apply to affected area once 30 g 3  . loratadine (CLARITIN) 10 MG tablet Take 10 mg by mouth daily.    Marland Kitchen LORazepam (ATIVAN) 0.5 MG tablet Take 1 tablet (0.5 mg total) by mouth every 6 (six) hours as needed  (Nausea or vomiting). 30 tablet 0  . nystatin (MYCOSTATIN) 100000 UNIT/ML suspension Use as directed 5 mLs (500,000 Units total) in the mouth or throat 4 (four) times daily. 60 mL 0  . ondansetron (ZOFRAN) 8 MG tablet Take 1 tablet (8 mg total) by mouth 2 (two) times daily as needed. Start on the third day after chemotherapy. 30 tablet 1  . prochlorperazine (COMPAZINE) 10 MG tablet Take 1 tablet (10 mg total) by mouth every 6 (six) hours as needed (Nausea or vomiting). 30 tablet 1  . rivaroxaban (XARELTO) 10 MG TABS tablet Take 1 tablet (10 mg total) by mouth daily. 30 tablet 1   No current facility-administered medications for this visit.     REVIEW OF SYSTEMS:    A 10+ POINT REVIEW OF SYSTEMS WAS OBTAINED including neurology, dermatology, psychiatry, cardiac, respiratory, lymph, extremities, GI, GU, Musculoskeletal, constitutional, breasts, reproductive, HEENT.  All pertinent positives are noted in the HPI.  All others are negative.   PHYSICAL EXAMINATION: ECOG PERFORMANCE STATUS: 1 - Symptomatic but completely ambulatory  Vitals:   11/01/18 1041  BP: 124/67  Pulse: (!) 105  Resp: 18  Temp: 97.8 F (36.6 C)  SpO2: 100%   Filed Weights   11/01/18 1041  Weight: 166 lb (75.3 kg)   .Body mass index is 26 kg/m.  GENERAL:alert, in no acute distress and comfortable SKIN: no acute rashes, no significant lesions EYES: conjunctiva are pink and non-injected, sclera anicteric OROPHARYNX: MMM, no exudates, no oropharyngeal erythema or ulceration NECK: supple, no JVD LYMPH:  no palpable lymphadenopathy in the cervical, axillary or inguinal regions LUNGS: clear to auscultation b/l with normal respiratory effort HEART: regular rate & rhythm ABDOMEN:  normoactive bowel sounds , non tender, not distended. No palpable hepatosplenomegaly.  Extremity: trace pedal edema PSYCH: alert & oriented x 3 with fluent speech NEURO: no focal motor/sensory deficits    LABORATORY DATA:  I have reviewed  the data as listed  . CBC Latest Ref Rng & Units 11/01/2018 10/28/2018 10/25/2018  WBC 4.0 - 10.5 K/uL 5.1 5.8 6.7  Hemoglobin 12.0 - 15.0 g/dL 9.6(L) 9.4(L) 8.8(L)  Hematocrit  36.0 - 46.0 % 32.0(L) 31.0(L) 28.9(L)  Platelets 150 - 400 K/uL 584(H) 653(H) 714(H)    . CMP Latest Ref Rng & Units 11/01/2018 10/28/2018 10/25/2018  Glucose 70 - 99 mg/dL 93 88 90  BUN 6 - 20 mg/dL 12 10 7   Creatinine 0.44 - 1.00 mg/dL 0.68 0.71 0.66  Sodium 135 - 145 mmol/L 141 140 140  Potassium 3.5 - 5.1 mmol/L 3.9 3.8 3.6  Chloride 98 - 111 mmol/L 108 107 108  CO2 22 - 32 mmol/L 26 25 23   Calcium 8.9 - 10.3 mg/dL 9.1 9.4 8.9  Total Protein 6.5 - 8.1 g/dL 7.3 7.0 6.8  Total Bilirubin 0.3 - 1.2 mg/dL 0.2(L) 0.2(L) <0.2(L)  Alkaline Phos 38 - 126 U/L 66 58 57  AST 15 - 41 U/L 30 31 26   ALT 0 - 44 U/L 32 35 23   Component     Latest Ref Rng & Units 08/04/2018  Total Protein ELP     6.0 - 8.5 g/dL 6.2  Albumin ELP     2.9 - 4.4 g/dL 2.6 (L)  Alpha-1-Globulin     0.0 - 0.4 g/dL 0.3  Alpha-2-Globulin     0.4 - 1.0 g/dL 0.9  Beta Globulin     0.7 - 1.3 g/dL 1.0  Gamma Globulin     0.4 - 1.8 g/dL 1.4  M-SPIKE, %     Not Observed g/dL Not Observed  SPE Interp.      Comment  Comment      Comment  Globulin, Total     2.2 - 3.9 g/dL 3.6  A/G Ratio     0.7 - 1.7 0.7  Iron     28 - 170 ug/dL 23 (L)  TIBC     250 - 450 ug/dL 196 (L)  Saturation Ratios     10.4 - 31.8 % 12  UIBC     ug/dL 173  Retic Ct Pct     0.4 - 3.1 % 2.0  RBC.     3.87 - 5.11 MIL/uL 3.42 (L)  Retic Count, Absolute     19.0 - 186.0 K/uL 69.8  Immature Retic Fract     2.3 - 15.9 % 29.4 (H)  Hepatitis B Surface Ag     Negative Negative  HCV Ab     0.0 - 0.9 s/co ratio 0.1  Hep A Ab, IgM     Negative Negative  Hep B Core Ab, IgM     Negative Negative  Vitamin B12     180 - 914 pg/mL 135 (L)  Folate     >5.9 ng/mL 11.5  Ferritin     11 - 307 ng/mL 91  Uric Acid, Serum     2.5 - 7.1 mg/dL 4.5  LDH     98 - 192 U/L  203 (H)  AFP, Serum, Tumor Marker     0.0 - 8.3 ng/mL 3.4  CEA     0.0 - 4.7 ng/mL 1.4  Beta-2 Microglobulin     0.6 - 2.4 mg/L 2.4  Magnesium     1.7 - 2.4 mg/dL 2.0  Phosphorus     2.5 - 4.6 mg/dL 2.7    08/04/18 Left Cervical Core Biopsy:   08/06/18 BM Bx:    RADIOGRAPHIC STUDIES: I have personally reviewed the radiological images as listed and agreed with the findings in the report. Dg Chest 2 View  Result Date: 10/16/2018 CLINICAL DATA:  Cough and fever. Undergoing chemotherapy for Hodgkin's  lymphoma. EXAM: CHEST - 2 VIEW COMPARISON:  08/03/2018 FINDINGS: Heart size is normal. Right arm PICC line is seen in appropriate position. New streaky opacity is seen in both lung bases, right side greater than left, which may be due to atelectasis or infiltrates. No evidence of pulmonary edema or pleural effusion. Previously seen anterior mediastinal mass is no longer visualized. IMPRESSION: Right greater than left basilar atelectasis versus infiltrate. Significant decrease in size of anterior mediastinal mass since prior study. Electronically Signed   By: Earle Gell M.D.   On: 10/16/2018 12:43    ASSESSMENT & PLAN:   38 y.o. 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 LDH - no significantly elevated. 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 With cytopenias concern for BM involvement with Hodgkins lymphoma  08/05/18 ECHO revealed LV EF of 65-70%. No pericardialeffusion.   08/06/18 BM Bx revealed involvement by Hodgkin's lymphoma  08/13/18 PET/CT revealed Large hypermetabolic mass within the anterior mediastinum identified compatible with lymphoma, Deauville criteria 5. There is extensive  hypermetabolic left supraclavicular, left axillary, left hilar and right iliac adenopathy, Deauville criteria 4 and 5. Additional lesions are identified within the caudate lobe of liver and spleen, Deauville criteria 4. Multifocal hypermetabolic bone lesions are also identified, Deauville criteria 5   Pt began C1 AAVD on 08/09/18  2. Liver and splenic lesions concerning for involvement withCHL  3. SVC compression due to anterior mediastinal mass - no upper extremity , neck or face swelling or change in visiion to suggest overt SVC syndrome  4. Microcytic anemia - normal iron. Likely anemia or chronic disease  5. B12 deficiency ? Pernicious anemia  PLAN:  -Discussed pt labwork today, 11/01/18 -Delayed C3D15 for pneumonia admission on 10/16/18 to 10/19/18  -The pt has no prohibitive toxicities from continuing C3D15 AAVD at this time. 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 The patient knows to call the clinic with any problems, questions or concerns.  The total time spent in the appt was 25 minutes and more than 50% was on counseling and direct patient cares.    Sullivan Lone MD MS AAHIVMS [facility] Hematology/Oncology Physician [facility]  (Office):       [phone] (Work cell):  [phone] (Fax):           [phone]  11/01/2018 11:10 AM  I, Baldwin Jamaica, am acting as a scribe for Dr. Sullivan Lone.   .I have reviewed the above documentation for accuracy and completeness, and I agree with the above. Brunetta Genera MD

## 2018-11-01 NOTE — Patient Instructions (Signed)
[facility] Discharge Instructions for Patients Receiving Chemotherapy  Today you received the following chemotherapy agents: Doxirubicin, Dacarbazine, Vinblastine and Adcetris.  To help prevent nausea and vomiting after your treatment, we encourage you to take your nausea medication as prescribed.    If you develop nausea and vomiting that is not controlled by your nausea medication, call the clinic.   BELOW ARE SYMPTOMS THAT SHOULD BE REPORTED IMMEDIATELY:  *FEVER GREATER THAN 100.5 F  *CHILLS WITH OR WITHOUT FEVER  NAUSEA AND VOMITING THAT IS NOT CONTROLLED WITH YOUR NAUSEA MEDICATION  *UNUSUAL SHORTNESS OF BREATH  *UNUSUAL BRUISING OR BLEEDING  TENDERNESS IN MOUTH AND THROAT WITH OR WITHOUT PRESENCE OF ULCERS  *URINARY PROBLEMS  *BOWEL PROBLEMS  UNUSUAL RASH Items with * indicate a potential emergency and should be followed up as soon as possible.  Feel free to call the clinic should you have any questions or concerns. The clinic phone number is [phone].  Please show the [facility] at check-in to the Emergency Department and triage nurse.

## 2018-11-01 NOTE — Patient Instructions (Signed)
Thank you for choosing [facility] to provide your oncology and hematology care.  To afford each patient quality time with our providers, please arrive 30 minutes before your scheduled appointment time.  If you arrive late for your appointment, you may be asked to reschedule.  We strive to give you quality time with our providers, and arriving late affects you and other patients whose appointments are after yours.    If you are a no show for multiple scheduled visits, you may be dismissed from the clinic at the providers discretion.     Again, thank you for choosing [facility], our hope is that these requests will decrease the amount of time that you wait before being seen by our physicians.  ______________________________________________________________________   Should you have questions after your visit to the [facility], please contact our office at [phone] between the hours of 8:30 and 4:30 p.m.    Voicemails left after 4:30p.m will not be returned until the following business day.     For prescription refill requests, please have your pharmacy contact us directly.  Please also try to allow 48 hours for prescription requests.     Please contact the scheduling department for questions regarding scheduling.  For scheduling of procedures such as PET scans, CT scans, MRI, Ultrasound, etc please contact central scheduling at [phone].     Resources For Cancer Patients and Caregivers:    Oncolink.org:  A wonderful resource for patients and healthcare providers for information regarding your disease, ways to tract your treatment, what to expect, etc.      American Cancer Society:  800-227-2345  Can help patients locate various types of support and financial assistance   Cancer Care: 1-800-813-HOPE (4673) Provides financial assistance, online support groups, medication/co-pay assistance.     [place] DSS:  336-641-3447 Where to apply  for food stamps, Medicaid, and utility assistance   Medicare Rights Center: 800-333-4114 Helps people with Medicare understand their rights and benefits, navigate the Medicare system, and secure the quality healthcare they deserve   SCAT: 336-333-6589 [place] [employer]'s shared-ride transportation service for eligible riders who have a disability that prevents them from riding the fixed route bus.     For additional information on assistance programs please contact our social worker:   Abigail Elmore:  [phone]  

## 2018-11-01 NOTE — Telephone Encounter (Signed)
error 

## 2018-11-02 ENCOUNTER — Telehealth: Payer: Self-pay

## 2018-11-02 ENCOUNTER — Ambulatory Visit: Payer: Self-pay

## 2018-11-02 MED ORDER — HYDROCODONE-ACETAMINOPHEN 5-325 MG PO TABS: 1.0000 | ORAL_TABLET | Freq: Four times a day (QID) | ORAL | 0 refills | Status: AC | PRN

## 2018-11-02 MED FILL — HYDROCODON-APAP 5-325: 5-325 | 3 days supply | Qty: 30 | Fill #0

## 2018-11-02 NOTE — Telephone Encounter (Signed)
Per 1/7 Corene Cornea RN request. Ask to reschedule and call patient concerning injection is to soon due to the late time of infusion completion. R/s for 1/8 at anytime

## 2018-11-02 NOTE — Telephone Encounter (Signed)
Left a voice msg for patient concerning her newly scheduled upcoming appointments. Asked if she could stop by scheduling to get a copy when she come in tomorrow. Per 1/6 los

## 2018-11-03 ENCOUNTER — Inpatient Hospital Stay: Payer: Medicaid Other

## 2018-11-03 VITALS — BP 128/76 | HR 86 | Temp 97.8°F | Resp 18

## 2018-11-03 DIAGNOSIS — Z7189 Other specified counseling: Secondary | ICD-10-CM

## 2018-11-03 DIAGNOSIS — I871 Compression of vein: Secondary | ICD-10-CM

## 2018-11-03 DIAGNOSIS — Z5112 Encounter for antineoplastic immunotherapy: Secondary | ICD-10-CM | POA: Diagnosis not present

## 2018-11-03 DIAGNOSIS — C8198 Hodgkin lymphoma, unspecified, lymph nodes of multiple sites: Secondary | ICD-10-CM

## 2018-11-03 MED ORDER — PEGFILGRASTIM-CBQV 6 MG/0.6ML ~~LOC~~ SOSY
PREFILLED_SYRINGE | SUBCUTANEOUS | Status: AC
  Filled 2018-11-03: qty 0.6

## 2018-11-03 MED ORDER — PEGFILGRASTIM-CBQV 6 MG/0.6ML ~~LOC~~ SOSY
6.0000 mg | PREFILLED_SYRINGE | Freq: Once | SUBCUTANEOUS | Status: AC
  Administered 2018-11-03: 6 mg via SUBCUTANEOUS

## 2018-11-03 NOTE — Patient Instructions (Signed)
Pegfilgrastim injection  What is this medicine?  PEGFILGRASTIM (PEG fil gra stim) is a long-acting granulocyte colony-stimulating factor that stimulates the growth of neutrophils, a type of white blood cell important in the body's fight against infection. It is used to reduce the incidence of fever and infection in patients with certain types of cancer who are receiving chemotherapy that affects the bone marrow, and to increase survival after being exposed to high doses of radiation.  This medicine may be used for other purposes; ask your health care provider or pharmacist if you have questions.  COMMON BRAND NAME(S): Fulphila, Neulasta, UDENYCA  What should I tell my health care provider before I take this medicine?  They need to know if you have any of these conditions:  -kidney disease  -latex allergy  -ongoing radiation therapy  -sickle cell disease  -skin reactions to acrylic adhesives (On-Body Injector only)  -an unusual or allergic reaction to pegfilgrastim, filgrastim, other medicines, foods, dyes, or preservatives  -pregnant or trying to get pregnant  -breast-feeding  How should I use this medicine?  This medicine is for injection under the skin. If you get this medicine at home, you will be taught how to prepare and give the pre-filled syringe or how to use the On-body Injector. Refer to the patient Instructions for Use for detailed instructions. Use exactly as directed. Tell your healthcare provider immediately if you suspect that the On-body Injector may not have performed as intended or if you suspect the use of the On-body Injector resulted in a missed or partial dose.  It is important that you put your used needles and syringes in a special sharps container. Do not put them in a trash can. If you do not have a sharps container, call your pharmacist or healthcare provider to get one.  Talk to your pediatrician regarding the use of this medicine in children. While this drug may be prescribed for  selected conditions, precautions do apply.  Overdosage: If you think you have taken too much of this medicine contact a poison control center or emergency room at once.  NOTE: This medicine is only for you. Do not share this medicine with others.  What if I miss a dose?  It is important not to miss your dose. Call your doctor or health care professional if you miss your dose. If you miss a dose due to an On-body Injector failure or leakage, a new dose should be administered as soon as possible using a single prefilled syringe for manual use.  What may interact with this medicine?  Interactions have not been studied.  Give your health care provider a list of all the medicines, herbs, non-prescription drugs, or dietary supplements you use. Also tell them if you smoke, drink alcohol, or use illegal drugs. Some items may interact with your medicine.  This list may not describe all possible interactions. Give your health care provider a list of all the medicines, herbs, non-prescription drugs, or dietary supplements you use. Also tell them if you smoke, drink alcohol, or use illegal drugs. Some items may interact with your medicine.  What should I watch for while using this medicine?  You may need blood work done while you are taking this medicine.  If you are going to need a MRI, CT scan, or other procedure, tell your doctor that you are using this medicine (On-Body Injector only).  What side effects may I notice from receiving this medicine?  Side effects that you should report to   your doctor or health care professional as soon as possible:  -allergic reactions like skin rash, itching or hives, swelling of the face, lips, or tongue  -back pain  -dizziness  -fever  -pain, redness, or irritation at site where injected  -pinpoint red spots on the skin  -red or dark-brown urine  -shortness of breath or breathing problems  -stomach or side pain, or pain at the shoulder  -swelling  -tiredness  -trouble passing urine or  change in the amount of urine  Side effects that usually do not require medical attention (report to your doctor or health care professional if they continue or are bothersome):  -bone pain  -muscle pain  This list may not describe all possible side effects. Call your doctor for medical advice about side effects. You may report side effects to FDA at 1-800-FDA-1088.  Where should I keep my medicine?  Keep out of the reach of children.  If you are using this medicine at home, you will be instructed on how to store it. Throw away any unused medicine after the expiration date on the label.  NOTE: This sheet is a summary. It may not cover all possible information. If you have questions about this medicine, talk to your doctor, pharmacist, or health care provider.   2019 Elsevier/Gold Standard (2018-01-18 16:57:08)

## 2018-11-04 ENCOUNTER — Telehealth: Payer: Self-pay | Admitting: Hematology

## 2018-11-04 ENCOUNTER — Inpatient Hospital Stay: Payer: Medicaid Other

## 2018-11-04 ENCOUNTER — Telehealth: Payer: Self-pay | Admitting: *Deleted

## 2018-11-04 NOTE — Telephone Encounter (Signed)
Per Dr. Irene Limbo, patient did not need Lab today - patient was called earlier to tell her not to come to appointment today.  Patient also had flush appt for PICC today and it was cancelled in error at same time. Contacted patient to let her know that scheduling will contact her to reschedule her flush for 11/05/2018. Patient accepted apology for mix up and verbalized understanding.

## 2018-11-04 NOTE — Telephone Encounter (Signed)
Scheduled appt per 1/9 sch message - unable to reach patient - left message with appt date and time

## 2018-11-05 ENCOUNTER — Inpatient Hospital Stay: Payer: Medicaid Other

## 2018-11-05 DIAGNOSIS — Z95828 Presence of other vascular implants and grafts: Secondary | ICD-10-CM

## 2018-11-05 DIAGNOSIS — Z5112 Encounter for antineoplastic immunotherapy: Secondary | ICD-10-CM | POA: Diagnosis not present

## 2018-11-05 MED ORDER — HEPARIN SOD (PORK) LOCK FLUSH 100 UNIT/ML IV SOLN
500.0000 [IU] | Freq: Once | INTRAVENOUS | Status: AC | PRN
  Administered 2018-11-05: 250 [IU]
  Filled 2018-11-05: qty 5

## 2018-11-05 MED ORDER — SODIUM CHLORIDE 0.9% FLUSH
10.0000 mL | INTRAVENOUS | Status: DC | PRN
  Administered 2018-11-05: 10 mL
  Filled 2018-11-05: qty 10

## 2018-11-08 ENCOUNTER — Inpatient Hospital Stay: Payer: Medicaid Other

## 2018-11-08 DIAGNOSIS — C8190 Hodgkin lymphoma, unspecified, unspecified site: Secondary | ICD-10-CM

## 2018-11-08 DIAGNOSIS — Z95828 Presence of other vascular implants and grafts: Secondary | ICD-10-CM

## 2018-11-08 DIAGNOSIS — Z5112 Encounter for antineoplastic immunotherapy: Secondary | ICD-10-CM | POA: Diagnosis not present

## 2018-11-08 LAB — CMP (CANCER CENTER ONLY)
ALT: 78 U/L — ABNORMAL HIGH (ref 0–44)
ANION GAP: 8 (ref 5–15)
AST: 43 U/L — ABNORMAL HIGH (ref 15–41)
Albumin: 3.4 g/dL — ABNORMAL LOW (ref 3.5–5.0)
Alkaline Phosphatase: 112 U/L (ref 38–126)
BUN: 12 mg/dL (ref 6–20)
CHLORIDE: 102 mmol/L (ref 98–111)
CO2: 28 mmol/L (ref 22–32)
Calcium: 8.9 mg/dL (ref 8.9–10.3)
Creatinine: 0.63 mg/dL (ref 0.44–1.00)
GFR, Est AFR Am: >60 mL/min (ref >60)
GFR, Estimated: >60 mL/min (ref >60)
Glucose, Bld: 91 mg/dL (ref 70–99)
Potassium: 3.9 mmol/L (ref 3.5–5.1)
Sodium: 138 mmol/L (ref 135–145)
Total Bilirubin: 0.3 mg/dL (ref 0.3–1.2)
Total Protein: 6.9 g/dL (ref 6.5–8.1)

## 2018-11-08 LAB — CBC WITH DIFFERENTIAL/PLATELET
Abs Immature Granulocytes: 0.11 10*3/uL — ABNORMAL HIGH (ref 0.00–0.07)
Basophils Absolute: 0 10*3/uL (ref 0.0–0.1)
Basophils Relative: 0 %
Eosinophils Absolute: 0.6 10*3/uL — ABNORMAL HIGH (ref 0.0–0.5)
Eosinophils Relative: 7 %
HEMATOCRIT: 31.2 % — AB (ref 36.0–46.0)
Hemoglobin: 9.6 g/dL — ABNORMAL LOW (ref 12.0–15.0)
Immature Granulocytes: 1 %
LYMPHS ABS: 1.3 10*3/uL (ref 0.7–4.0)
LYMPHS PCT: 16 %
MCH: 27.6 pg (ref 26.0–34.0)
MCHC: 30.8 g/dL (ref 30.0–36.0)
MCV: 89.7 fL (ref 80.0–100.0)
Monocytes Absolute: 0.5 10*3/uL (ref 0.1–1.0)
Monocytes Relative: 7 %
Neutro Abs: 5.3 10*3/uL (ref 1.7–7.7)
Neutrophils Relative %: 69 %
Platelets: 167 10*3/uL (ref 150–400)
RBC: 3.48 MIL/uL — ABNORMAL LOW (ref 3.87–5.11)
RDW: 20.5 % — ABNORMAL HIGH (ref 11.5–15.5)
WBC: 7.7 10*3/uL (ref 4.0–10.5)
nRBC: 0 % (ref 0.0–0.2)

## 2018-11-08 LAB — MAGNESIUM: Magnesium: 1.7 mg/dL (ref 1.7–2.4)

## 2018-11-08 MED ORDER — SODIUM CHLORIDE 0.9% FLUSH
10.0000 mL | INTRAVENOUS | Status: DC | PRN
  Administered 2018-11-08: 10 mL
  Filled 2018-11-08: qty 10

## 2018-11-08 MED ORDER — HEPARIN SOD (PORK) LOCK FLUSH 100 UNIT/ML IV SOLN
500.0000 [IU] | Freq: Once | INTRAVENOUS | Status: AC | PRN
  Administered 2018-11-08: 500 [IU]
  Filled 2018-11-08: qty 5

## 2018-11-11 ENCOUNTER — Inpatient Hospital Stay: Payer: Medicaid Other

## 2018-11-11 DIAGNOSIS — Z95828 Presence of other vascular implants and grafts: Secondary | ICD-10-CM

## 2018-11-11 DIAGNOSIS — Z5112 Encounter for antineoplastic immunotherapy: Secondary | ICD-10-CM | POA: Diagnosis not present

## 2018-11-11 MED ORDER — HEPARIN SOD (PORK) LOCK FLUSH 100 UNIT/ML IV SOLN
500.0000 [IU] | Freq: Once | INTRAVENOUS | Status: AC | PRN
  Administered 2018-11-11: 250 [IU]
  Filled 2018-11-11: qty 5

## 2018-11-11 MED ORDER — SODIUM CHLORIDE 0.9% FLUSH
10.0000 mL | INTRAVENOUS | Status: DC | PRN
  Administered 2018-11-11: 10 mL
  Filled 2018-11-11: qty 10

## 2018-11-12 NOTE — Progress Notes (Signed)
HEMATOLOGY/ONCOLOGY CLINIC NOTE  Date of Service: 11/15/2018  Patient Care Team: Patient, No Pcp Per as PCP - General (General Practice)  CHIEF COMPLAINTS/PURPOSE OF CONSULTATION:  F/u for Mx of Hodgkins lymphoma  HISTORY OF PRESENTING ILLNESS:   Cassandra Clark is a wonderful 38 y.o. female who has been referred to [place] by Dr. 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   Patient has a mild anemia and presented to the emergency room with new onset fevers chills and a 2-week history of worsening night sweats dry cough fatigue and anorexia.  Patient notes that she initially started feeling unwell from late June when she had noted increasing fatigue that required her to rest for longer periods of time.  She also noted that she at times felt lightheaded and dizzy when she bent forward.  She notes a decreased appetite.  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 She notes that she has not been able to do much of anything else other than rest at home and work to some degree at her job.  No chest pain no palpitations no diaphoresis no lower extremity edema.  No abdominal pain no nausea no vomiting no diarrhea.  No rectal bleeding. Notes that her periods have been regular.  In the emergency room the patient was noted to have a fever of 102.4 F and tachycardia related to this.  Blood counts showed that the patient was anemic with a hemoglobin around 8 with normal platelets and a WBC count of 3.6k.  Magnesium 1.9 and phosphorus of 2. She was also noted to be vitamin B12 deficient.  Most recent lab results (08/04/18) of CBC w/diff and CMP is as follows: all values are WNL except for WBC at 3.0k RBC at 3.64, HGB at 8.3, HCT at 82.7, MCV at 78.8, MCH at 22.8, MCHC at 28.9, RDW at 18.1, PLT at 459k, Lymphs abs at 300, Calcium at  8.0, Albumin at 2.7. 08/04/18 LDH at 203 08/04/18 Vitamin B12 at 135  X-ray of the chest done on 08/03/2018 showed Right paratracheal and left AP window masses likely representing lymphadenopathy. Consider CT chest for further characterization.  CT of the chest with contrast was subsequently done which showed 1. 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 In addition there are multiple nodular lesions in the spleen and possibly in the liver, as well as permeative destruction of the sternal manubrium with tumor rind anterior and posterior to the sternum. 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 Left-sided small cell lung cancer might give a similar appearance. Aggressive germ-cell tumor or thymic carcinoma or less likely differential diagnostic considerations. Tissue diagnosis recommended. 2. The anterior mediastinal mass is causing narrowing of the SVC, and mild invasion of the SVC with slight marginal thrombosis is not readily excluded. No large pulmonary embolus is identified.  Patient had additional lab testing which showed a near normal LDH was 203. Sedimentation rate was done subsequently and was noted to be elevated.  Urine pregnancy test was within normal limits.  Patient was noted to have significant headaches over the last 4 to 6 weeks and had a CT of the head without contrast in the ED on 07/04/2018 which showed  no acute intracranial normalities.  On review of systems, pt reports fevers, unquantified weight loss, shortness of breath, night sweats and denies overt chest pain, rashes.   Interval History:   Cassandra Clark returns today for management, evaluation and C4D1 of her A-AVD treatment of her Hodgkin's Lymphoma. The patient's last visit with [place] was on 11/01/18. The pt reports that she is doing well overall.   The pt  reports that she has not developed any new concerns in the interim. She notes that she has been eating well and has gained weight. She denies developing any concerns for infections, fevers, chills, and also denies difficulty breathing. The pt notes that she is not having periods at this time. She also denies any tingling or numbness in her hands or feet.   Lab results today (11/15/18) of CBC w/diff and CMP is as follows: all values are WNL except for RBC at 3.53, HGB at 10.0, HCT at 32.9, RDW at 21.6, Abs immature granulocytes at 0.29k, Glucose at 121, Total Bilirubin at <0.2. 11/15/18 Magnesium at 1.8  On review of systems, pt reports good energy levels, weight gain, eating well, sleeping well, and denies fevers, chills, difficulty breathing, developing any tingling or numbness in her hands or feet, abdominal pains, leg swelling, and any other symptoms.   MEDICAL HISTORY:  Past Medical History:  Diagnosis Date  . Anemia     SURGICAL HISTORY: Past Surgical History:  Procedure Laterality Date  . PICC LINE INSERTION Right 08/18/2018    SOCIAL HISTORY: Social History   Socioeconomic History  . Marital status: Legally Separated    Spouse name: Not on file  . Number of children: Not on file  . Years of education: Not on file  . Highest education level: Not on file  Occupational History  . Not on file  Social Needs  . Financial resource strain: Not on file  . Food insecurity:    Worry: Not on file    Inability: Not on file  . Transportation needs:    Medical: Not on file    Non-medical: Not on file  Tobacco Use  . Smoking status: Never Smoker  . Smokeless tobacco: Never Used  Substance and Sexual Activity  . Alcohol use: No  . Drug use: No    Comment: Drinks tea  . Sexual activity: Yes  Lifestyle  . Physical activity:    Days per week: Not on file    Minutes per session: Not on file  . Stress: Not on file  Relationships  . Social connections:    Talks on phone: Not on  file    Gets together: Not on file    Attends religious service: Not on file    Active member of club or organization: Not on file    Attends meetings of clubs or organizations: Not on file    Relationship status: Not on file  . Intimate partner violence:    Fear of current or ex partner: Not on file    Emotionally abused: Not on file    Physically abused: Not on file    Forced sexual activity: Not on file  Other Topics Concern  . Not on file  Social History Narrative  . Not on file    FAMILY HISTORY: Family History  Problem Relation Age of Onset  . Diabetes Mellitus II Father   . Hypertension Father   . Congestive Heart Failure Maternal Grandmother   . Diabetes Mellitus II Maternal Grandmother   .  Diabetes Mellitus II Maternal Uncle   . Congestive Heart Failure Maternal Uncle     ALLERGIES:  is allergic to penicillins.  MEDICATIONS:  Current Outpatient Medications  Medication Sig Dispense Refill  . acetaminophen (TYLENOL) 325 MG tablet Take 2 tablets (650 mg total) by mouth every 6 (six) hours as needed for mild pain, fever or headache. 30 tablet 1  . albuterol (PROVENTIL) (2.5 MG/3ML) 0.083% nebulizer solution Take 3 mLs (2.5 mg total) by nebulization every 6 (six) hours as needed for wheezing or shortness of breath. 75 mL 12  . B Complex-C (B-COMPLEX WITH VITAMIN C) tablet Take 1 tablet by mouth daily. 30 tablet 3  . Cyanocobalamin (B-12) 1000 MCG SUBL Place 2,000 mcg under the tongue daily. 60 each 3  . dexamethasone (DECADRON) 4 MG tablet Take 2 tablets by mouth once a day on the day after chemotherapy and then take 2 tablets two times a day for 2 days. Take with food. 30 tablet 1  . ferrous sulfate 325 (65 FE) MG tablet Take 1 tablet (325 mg total) by mouth daily. (Patient not taking: Reported on 08/23/2018) 14 tablet 0  . guaiFENesin (MUCINEX) 600 MG 12 hr tablet Take 1 tablet (600 mg total) by mouth 2 (two) times daily. (Patient not taking: Reported on 11/01/2018) 20  tablet o  . HYDROcodone-acetaminophen (NORCO) 5-325 MG tablet Take 1-2 tablets by mouth every 6 (six) hours as needed for moderate pain or severe pain. 30 tablet 0  . lidocaine-prilocaine (EMLA) cream Apply to affected area once 30 g 3  . loratadine (CLARITIN) 10 MG tablet Take 10 mg by mouth daily.    Marland Kitchen LORazepam (ATIVAN) 0.5 MG tablet Take 1 tablet (0.5 mg total) by mouth every 6 (six) hours as needed (Nausea or vomiting). 30 tablet 0  . nystatin (MYCOSTATIN) 100000 UNIT/ML suspension Use as directed 5 mLs (500,000 Units total) in the mouth or throat 4 (four) times daily. (Patient not taking: Reported on 11/01/2018) 60 mL 0  . ondansetron (ZOFRAN) 8 MG tablet Take 1 tablet (8 mg total) by mouth 2 (two) times daily as needed. Start on the third day after chemotherapy. 30 tablet 1  . prochlorperazine (COMPAZINE) 10 MG tablet Take 1 tablet (10 mg total) by mouth every 6 (six) hours as needed (Nausea or vomiting). 30 tablet 1  . rivaroxaban (XARELTO) 10 MG TABS tablet Take 1 tablet (10 mg total) by mouth daily. 30 tablet 1   No current facility-administered medications for this visit.     REVIEW OF SYSTEMS:    A 10+ POINT REVIEW OF SYSTEMS WAS OBTAINED including neurology, dermatology, psychiatry, cardiac, respiratory, lymph, extremities, GI, GU, Musculoskeletal, constitutional, breasts, reproductive, HEENT.  All pertinent positives are noted in the HPI.  All others are negative.   PHYSICAL EXAMINATION: ECOG PERFORMANCE STATUS: 1 - Symptomatic but completely ambulatory  Vitals:   11/15/18 0937  BP: 119/80  Pulse: 88  Resp: 18  Temp: 98 F (36.7 C)  SpO2: 100%   Filed Weights   11/15/18 0937  Weight: 168 lb 12.8 oz (76.6 kg)   .Body mass index is 26.44 kg/m.  GENERAL:alert, in no acute distress and comfortable SKIN: no acute rashes, no significant lesions EYES: conjunctiva are pink and non-injected, sclera anicteric OROPHARYNX: MMM, no exudates, no oropharyngeal erythema or  ulceration NECK: supple, no JVD LYMPH:  no palpable lymphadenopathy in the cervical, axillary or inguinal regions LUNGS: clear to auscultation b/l with normal respiratory effort HEART: regular rate & rhythm  ABDOMEN:  normoactive bowel sounds , non tender, not distended. No palpable hepatosplenomegaly.  Extremity: no pedal edema PSYCH: alert & oriented x 3 with fluent speech NEURO: no focal motor/sensory deficits   LABORATORY DATA:  I have reviewed the data as listed  . CBC Latest Ref Rng & Units 11/15/2018 11/08/2018 11/01/2018  WBC 4.0 - 10.5 K/uL 7.1 7.7 5.1  Hemoglobin 12.0 - 15.0 g/dL 10.0(L) 9.6(L) 9.6(L)  Hematocrit 36.0 - 46.0 % 32.9(L) 31.2(L) 32.0(L)  Platelets 150 - 400 K/uL 205 167 584(H)    . CMP Latest Ref Rng & Units 11/15/2018 11/08/2018 11/01/2018  Glucose 70 - 99 mg/dL 121(H) 91 93  BUN 6 - 20 mg/dL 12 12 12   Creatinine 0.44 - 1.00 mg/dL 0.73 0.63 0.68  Sodium 135 - 145 mmol/L 142 138 141  Potassium 3.5 - 5.1 mmol/L 4.1 3.9 3.9  Chloride 98 - 111 mmol/L 107 102 108  CO2 22 - 32 mmol/L 25 28 26   Calcium 8.9 - 10.3 mg/dL 9.2 8.9 9.1  Total Protein 6.5 - 8.1 g/dL 7.2 6.9 7.3  Total Bilirubin 0.3 - 1.2 mg/dL <0.2(L) 0.3 0.2(L)  Alkaline Phos 38 - 126 U/L 105 112 66  AST 15 - 41 U/L 16 43(H) 30  ALT 0 - 44 U/L 35 78(H) 32   Component     Latest Ref Rng & Units 08/04/2018  Total Protein ELP     6.0 - 8.5 g/dL 6.2  Albumin ELP     2.9 - 4.4 g/dL 2.6 (L)  Alpha-1-Globulin     0.0 - 0.4 g/dL 0.3  Alpha-2-Globulin     0.4 - 1.0 g/dL 0.9  Beta Globulin     0.7 - 1.3 g/dL 1.0  Gamma Globulin     0.4 - 1.8 g/dL 1.4  M-SPIKE, %     Not Observed g/dL Not Observed  SPE Interp.      Comment  Comment      Comment  Globulin, Total     2.2 - 3.9 g/dL 3.6  A/G Ratio     0.7 - 1.7 0.7  Iron     28 - 170 ug/dL 23 (L)  TIBC     250 - 450 ug/dL 196 (L)  Saturation Ratios     10.4 - 31.8 % 12  UIBC     ug/dL 173  Retic Ct Pct     0.4 - 3.1 % 2.0  RBC.     3.87 -  5.11 MIL/uL 3.42 (L)  Retic Count, Absolute     19.0 - 186.0 K/uL 69.8  Immature Retic Fract     2.3 - 15.9 % 29.4 (H)  Hepatitis B Surface Ag     Negative Negative  HCV Ab     0.0 - 0.9 s/co ratio 0.1  Hep A Ab, IgM     Negative Negative  Hep B Core Ab, IgM     Negative Negative  Vitamin B12     180 - 914 pg/mL 135 (L)  Folate     >5.9 ng/mL 11.5  Ferritin     11 - 307 ng/mL 91  Uric Acid, Serum     2.5 - 7.1 mg/dL 4.5  LDH     98 - 192 U/L 203 (H)  AFP, Serum, Tumor Marker     0.0 - 8.3 ng/mL 3.4  CEA     0.0 - 4.7 ng/mL 1.4  Beta-2 Microglobulin     0.6 - 2.4 mg/L 2.4  Magnesium     1.7 - 2.4 mg/dL 2.0  Phosphorus     2.5 - 4.6 mg/dL 2.7    08/04/18 Left Cervical Core Biopsy:   08/06/18 BM Bx:    RADIOGRAPHIC STUDIES: I have personally reviewed the radiological images as listed and agreed with the findings in the report. Dg Chest 2 View  Result Date: 10/16/2018 CLINICAL DATA:  Cough and fever. Undergoing chemotherapy for Hodgkin's lymphoma. EXAM: CHEST - 2 VIEW COMPARISON:  08/03/2018 FINDINGS: Heart size is normal. Right arm PICC line is seen in appropriate position. New streaky opacity is seen in both lung bases, right side greater than left, which may be due to atelectasis or infiltrates. No evidence of pulmonary edema or pleural effusion. Previously seen anterior mediastinal mass is no longer visualized. IMPRESSION: Right greater than left basilar atelectasis versus infiltrate. Significant decrease in size of anterior mediastinal mass since prior study. Electronically Signed   By: Earle Gell M.D.   On: 10/16/2018 12:43    ASSESSMENT & PLAN:   38 y.o. 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 LDH - no significantly elevated. Sed rate elevated at 62 Discussed  supraclavicular LN bx with pathology - Prelim -consistent with Hodgkins lymphoma. Stage IVB with liver involvement. With cytopenias concern for BM involvement with Hodgkins lymphoma  08/05/18 ECHO revealed LV EF of 65-70%. No pericardialeffusion.   08/06/18 BM Bx revealed involvement by Hodgkin's lymphoma  08/13/18 PET/CT revealed Large hypermetabolic mass within the anterior mediastinum identified compatible with lymphoma, Deauville criteria 5. There is extensive hypermetabolic left supraclavicular, left axillary, left hilar and right iliac adenopathy, Deauville criteria 4 and 5. Additional lesions are identified within the caudate lobe of liver and spleen, Deauville criteria 4. Multifocal hypermetabolic bone lesions are also identified, Deauville criteria 5   Pt began C1 AAVD on 08/09/18  -Delayed C3D15 for pneumonia admission on 10/16/18 to 10/19/18   2. Liver and splenic lesions concerning for involvement withCHL  3. SVC compression due to anterior mediastinal mass - no upper extremity , neck or face swelling or change in visiion to suggest overt SVC syndrome  4. Microcytic anemia - normal iron. Likely anemia or chronic disease  5. B12 deficiency due to Pernicious anemia  Component     Latest Ref Rng & Units 08/04/2018 08/06/2018  Vitamin B12     180 - 914 pg/mL 135 (L)   Parietal Cell Antibody-IgG     0.0 - 20.0 Units  12.4  Intrinsic Factor     0.0 - 1.1 AU/mL  6.0 (H)   6. PLAN:  -Discussed pt labwork today, 11/15/18; blood counts and chemistries are stable, magnesium normal at 1.8 -Proceed with PET/CT on 11/22/18 -The pt has no prohibitive toxicities from continuing C4D1 AAVD at this time. 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 -port flush with each lab -f/u for PET/CT as schedule for 1/27 -RTC with dr Irene Limbo in 2 weeks   All of the patients questions were answered with apparent satisfaction. The patient knows to call the clinic with any problems, questions or concerns.  The total time spent in the appt was 25 minutes and more than 50% was on counseling and direct patient cares.    Sullivan Lone MD MS AAHIVMS [facility] Hematology/Oncology Physician [facility]  (Office):       [phone] (Work cell):  [phone] (Fax):           [phone]  11/15/2018 10:01 AM  I, Baldwin [place], am acting as a scribe for Dr. Sullivan Lone.   .I have reviewed the above documentation for accuracy and completeness, and I agree with the above. Brunetta Genera MD

## 2018-11-15 ENCOUNTER — Inpatient Hospital Stay: Payer: Medicaid Other

## 2018-11-15 ENCOUNTER — Inpatient Hospital Stay (HOSPITAL_BASED_OUTPATIENT_CLINIC_OR_DEPARTMENT_OTHER): Payer: Medicaid Other | Admitting: Hematology

## 2018-11-15 VITALS — BP 119/80 | HR 88 | Temp 98.0°F | Resp 18 | Ht 67.0 in | Wt 168.8 lb

## 2018-11-15 DIAGNOSIS — Z5112 Encounter for antineoplastic immunotherapy: Secondary | ICD-10-CM | POA: Diagnosis not present

## 2018-11-15 DIAGNOSIS — C8190 Hodgkin lymphoma, unspecified, unspecified site: Secondary | ICD-10-CM

## 2018-11-15 DIAGNOSIS — D510 Vitamin B12 deficiency anemia due to intrinsic factor deficiency: Secondary | ICD-10-CM

## 2018-11-15 DIAGNOSIS — Z5111 Encounter for antineoplastic chemotherapy: Secondary | ICD-10-CM

## 2018-11-15 DIAGNOSIS — C8198 Hodgkin lymphoma, unspecified, lymph nodes of multiple sites: Secondary | ICD-10-CM | POA: Diagnosis not present

## 2018-11-15 DIAGNOSIS — Z7189 Other specified counseling: Secondary | ICD-10-CM

## 2018-11-15 DIAGNOSIS — I871 Compression of vein: Secondary | ICD-10-CM

## 2018-11-15 LAB — CBC WITH DIFFERENTIAL/PLATELET
Abs Immature Granulocytes: 0.29 10*3/uL — ABNORMAL HIGH (ref 0.00–0.07)
BASOS PCT: 0 %
Basophils Absolute: 0 10*3/uL (ref 0.0–0.1)
Eosinophils Absolute: 0.2 10*3/uL (ref 0.0–0.5)
Eosinophils Relative: 2 %
HCT: 32.9 % — ABNORMAL LOW (ref 36.0–46.0)
Hemoglobin: 10 g/dL — ABNORMAL LOW (ref 12.0–15.0)
Immature Granulocytes: 4 %
Lymphocytes Relative: 21 %
Lymphs Abs: 1.5 10*3/uL (ref 0.7–4.0)
MCH: 28.3 pg (ref 26.0–34.0)
MCHC: 30.4 g/dL (ref 30.0–36.0)
MCV: 93.2 fL (ref 80.0–100.0)
Monocytes Absolute: 0.5 10*3/uL (ref 0.1–1.0)
Monocytes Relative: 8 %
Neutro Abs: 4.6 10*3/uL (ref 1.7–7.7)
Neutrophils Relative %: 65 %
Platelets: 205 10*3/uL (ref 150–400)
RBC: 3.53 MIL/uL — ABNORMAL LOW (ref 3.87–5.11)
RDW: 21.6 % — AB (ref 11.5–15.5)
WBC: 7.1 10*3/uL (ref 4.0–10.5)
nRBC: 0 % (ref 0.0–0.2)

## 2018-11-15 LAB — CMP (CANCER CENTER ONLY)
ALT: 35 U/L (ref 0–44)
AST: 16 U/L (ref 15–41)
Albumin: 3.5 g/dL (ref 3.5–5.0)
Alkaline Phosphatase: 105 U/L (ref 38–126)
Anion gap: 10 (ref 5–15)
BUN: 12 mg/dL (ref 6–20)
CALCIUM: 9.2 mg/dL (ref 8.9–10.3)
CO2: 25 mmol/L (ref 22–32)
Chloride: 107 mmol/L (ref 98–111)
Creatinine: 0.73 mg/dL (ref 0.44–1.00)
GFR, Est AFR Am: >60 mL/min (ref >60)
Glucose, Bld: 121 mg/dL — ABNORMAL HIGH (ref 70–99)
Potassium: 4.1 mmol/L (ref 3.5–5.1)
Sodium: 142 mmol/L (ref 135–145)
Total Protein: 7.2 g/dL (ref 6.5–8.1)

## 2018-11-15 LAB — MAGNESIUM: Magnesium: 1.8 mg/dL (ref 1.7–2.4)

## 2018-11-15 MED ORDER — SODIUM CHLORIDE 0.9% FLUSH
10.0000 mL | INTRAVENOUS | Status: DC | PRN
  Administered 2018-11-15: 10 mL
  Filled 2018-11-15: qty 10

## 2018-11-15 MED ORDER — PALONOSETRON HCL INJECTION 0.25 MG/5ML
0.2500 mg | Freq: Once | INTRAVENOUS | Status: AC
  Administered 2018-11-15: 0.25 mg via INTRAVENOUS

## 2018-11-15 MED ORDER — VINBLASTINE SULFATE CHEMO INJECTION 1 MG/ML
6.0000 mg/m2 | Freq: Once | INTRAVENOUS | Status: AC
  Administered 2018-11-15: 11 mg via INTRAVENOUS
  Filled 2018-11-15: qty 11

## 2018-11-15 MED ORDER — DOXORUBICIN HCL CHEMO IV INJECTION 2 MG/ML
25.0000 mg/m2 | Freq: Once | INTRAVENOUS | Status: AC
  Administered 2018-11-15: 46 mg via INTRAVENOUS
  Filled 2018-11-15: qty 23

## 2018-11-15 MED ORDER — ACETAMINOPHEN 325 MG PO TABS
650.0000 mg | ORAL_TABLET | Freq: Once | ORAL | Status: AC
  Administered 2018-11-15: 650 mg via ORAL

## 2018-11-15 MED ORDER — DIPHENHYDRAMINE HCL 25 MG PO CAPS
ORAL_CAPSULE | ORAL | Status: AC
  Filled 2018-11-15: qty 1

## 2018-11-15 MED ORDER — HEPARIN SOD (PORK) LOCK FLUSH 100 UNIT/ML IV SOLN
250.0000 [IU] | Freq: Once | INTRAVENOUS | Status: AC | PRN
  Administered 2018-11-15: 250 [IU]
  Filled 2018-11-15: qty 5

## 2018-11-15 MED ORDER — ACETAMINOPHEN 325 MG PO TABS
ORAL_TABLET | ORAL | Status: AC
  Filled 2018-11-15: qty 2

## 2018-11-15 MED ORDER — SODIUM CHLORIDE 0.9 % IV SOLN
Freq: Once | INTRAVENOUS | Status: AC
  Administered 2018-11-15: 11:00:00 via INTRAVENOUS
  Filled 2018-11-15: qty 250

## 2018-11-15 MED ORDER — DIPHENHYDRAMINE HCL 25 MG PO TABS
25.0000 mg | ORAL_TABLET | Freq: Once | ORAL | Status: AC
  Administered 2018-11-15: 25 mg via ORAL
  Filled 2018-11-15: qty 1

## 2018-11-15 MED ORDER — PALONOSETRON HCL INJECTION 0.25 MG/5ML
INTRAVENOUS | Status: AC
  Filled 2018-11-15: qty 5

## 2018-11-15 MED ORDER — SODIUM CHLORIDE 0.9 % IV SOLN
1.2000 mg/kg | Freq: Once | INTRAVENOUS | Status: AC
  Administered 2018-11-15: 85 mg via INTRAVENOUS
  Filled 2018-11-15: qty 17

## 2018-11-15 MED ORDER — SODIUM CHLORIDE 0.9 % IV SOLN
Freq: Once | INTRAVENOUS | Status: AC
  Administered 2018-11-15: 11:00:00 via INTRAVENOUS
  Filled 2018-11-15: qty 5

## 2018-11-15 MED ORDER — SODIUM CHLORIDE 0.9 % IV SOLN
375.0000 mg/m2 | Freq: Once | INTRAVENOUS | Status: AC
  Administered 2018-11-15: 690 mg via INTRAVENOUS
  Filled 2018-11-15: qty 69

## 2018-11-15 NOTE — Patient Instructions (Signed)
Thank you for choosing [facility] to provide your oncology and hematology care.  To afford each patient quality time with our providers, please arrive 30 minutes before your scheduled appointment time.  If you arrive late for your appointment, you may be asked to reschedule.  We strive to give you quality time with our providers, and arriving late affects you and other patients whose appointments are after yours.    If you are a no show for multiple scheduled visits, you may be dismissed from the clinic at the providers discretion.     Again, thank you for choosing [facility], our hope is that these requests will decrease the amount of time that you wait before being seen by our physicians.  ______________________________________________________________________   Should you have questions after your visit to the [facility], please contact our office at [phone] between the hours of 8:30 and 4:30 p.m.    Voicemails left after 4:30p.m will not be returned until the following business day.     For prescription refill requests, please have your pharmacy contact us directly.  Please also try to allow 48 hours for prescription requests.     Please contact the scheduling department for questions regarding scheduling.  For scheduling of procedures such as PET scans, CT scans, MRI, Ultrasound, etc please contact central scheduling at [phone].     Resources For Cancer Patients and Caregivers:    Oncolink.org:  A wonderful resource for patients and healthcare providers for information regarding your disease, ways to tract your treatment, what to expect, etc.      American Cancer Society:  800-227-2345  Can help patients locate various types of support and financial assistance   Cancer Care: 1-800-813-HOPE (4673) Provides financial assistance, online support groups, medication/co-pay assistance.     [place] DSS:  336-641-3447 Where to apply  for food stamps, Medicaid, and utility assistance   Medicare Rights Center: 800-333-4114 Helps people with Medicare understand their rights and benefits, navigate the Medicare system, and secure the quality healthcare they deserve   SCAT: 336-333-6589 [place] [employer]'s shared-ride transportation service for eligible riders who have a disability that prevents them from riding the fixed route bus.     For additional information on assistance programs please contact our social worker:   Abigail Elmore:  [phone]  

## 2018-11-15 NOTE — Patient Instructions (Signed)
[place] Discharge Instructions for Patients Receiving Chemotherapy  Today you received the following chemotherapy agents Adriamycin, Vinblastine, DTIC,  Adcetris To help prevent nausea and vomiting after your treatment, we encourage you to take your nausea medication as directed If you develop nausea and vomiting that is not controlled by your nausea medication, call the clinic.   BELOW ARE SYMPTOMS THAT SHOULD BE REPORTED IMMEDIATELY:  *FEVER GREATER THAN 100.5 F  *CHILLS WITH OR WITHOUT FEVER  NAUSEA AND VOMITING THAT IS NOT CONTROLLED WITH YOUR NAUSEA MEDICATION  *UNUSUAL SHORTNESS OF BREATH  *UNUSUAL BRUISING OR BLEEDING  TENDERNESS IN MOUTH AND THROAT WITH OR WITHOUT PRESENCE OF ULCERS  *URINARY PROBLEMS  *BOWEL PROBLEMS  UNUSUAL RASH Items with * indicate a potential emergency and should be followed up as soon as possible.  Feel free to call the clinic should you have any questions or concerns. The clinic phone number is [phone].  Please show the [place] at check-in to the Emergency Department and triage nurse.

## 2018-11-16 ENCOUNTER — Inpatient Hospital Stay: Payer: Medicaid Other

## 2018-11-16 VITALS — BP 124/63 | HR 96 | Temp 97.7°F | Resp 18

## 2018-11-16 DIAGNOSIS — I871 Compression of vein: Secondary | ICD-10-CM

## 2018-11-16 DIAGNOSIS — Z7189 Other specified counseling: Secondary | ICD-10-CM

## 2018-11-16 DIAGNOSIS — C8198 Hodgkin lymphoma, unspecified, lymph nodes of multiple sites: Secondary | ICD-10-CM

## 2018-11-16 DIAGNOSIS — Z5112 Encounter for antineoplastic immunotherapy: Secondary | ICD-10-CM | POA: Diagnosis not present

## 2018-11-16 MED ORDER — PEGFILGRASTIM-CBQV 6 MG/0.6ML ~~LOC~~ SOSY
PREFILLED_SYRINGE | SUBCUTANEOUS | Status: AC
  Filled 2018-11-16: qty 0.6

## 2018-11-16 MED ORDER — PEGFILGRASTIM-CBQV 6 MG/0.6ML ~~LOC~~ SOSY
6.0000 mg | PREFILLED_SYRINGE | Freq: Once | SUBCUTANEOUS | Status: AC
  Administered 2018-11-16: 6 mg via SUBCUTANEOUS

## 2018-11-16 MED FILL — DEXAMETHASONE 4 MG TABLET: 4 | 8 days supply | Qty: 30 | Fill #1

## 2018-11-18 ENCOUNTER — Telehealth: Payer: Self-pay

## 2018-11-18 ENCOUNTER — Inpatient Hospital Stay: Payer: Medicaid Other

## 2018-11-18 DIAGNOSIS — C8198 Hodgkin lymphoma, unspecified, lymph nodes of multiple sites: Secondary | ICD-10-CM

## 2018-11-18 DIAGNOSIS — Z95828 Presence of other vascular implants and grafts: Secondary | ICD-10-CM

## 2018-11-18 DIAGNOSIS — Z5112 Encounter for antineoplastic immunotherapy: Secondary | ICD-10-CM | POA: Diagnosis not present

## 2018-11-18 LAB — CMP (CANCER CENTER ONLY)
ALT: 34 U/L (ref 0–44)
AST: 19 U/L (ref 15–41)
Albumin: 3.6 g/dL (ref 3.5–5.0)
Alkaline Phosphatase: 127 U/L — ABNORMAL HIGH (ref 38–126)
Anion gap: 10 (ref 5–15)
BUN: 15 mg/dL (ref 6–20)
CO2: 25 mmol/L (ref 22–32)
Calcium: 9.1 mg/dL (ref 8.9–10.3)
Chloride: 105 mmol/L (ref 98–111)
Creatinine: 0.68 mg/dL (ref 0.44–1.00)
GFR, Estimated: >60 mL/min (ref >60)
Glucose, Bld: 165 mg/dL — ABNORMAL HIGH (ref 70–99)
Potassium: 3.8 mmol/L (ref 3.5–5.1)
Sodium: 140 mmol/L (ref 135–145)
Total Bilirubin: 0.3 mg/dL (ref 0.3–1.2)
Total Protein: 7.2 g/dL (ref 6.5–8.1)

## 2018-11-18 LAB — CBC WITH DIFFERENTIAL/PLATELET
Abs Immature Granulocytes: 6.71 10*3/uL — ABNORMAL HIGH (ref 0.00–0.07)
BASOS ABS: 0 10*3/uL (ref 0.0–0.1)
Basophils Relative: 0 %
Eosinophils Absolute: 0 10*3/uL (ref 0.0–0.5)
Eosinophils Relative: 0 %
HCT: 32 % — ABNORMAL LOW (ref 36.0–46.0)
Hemoglobin: 10 g/dL — ABNORMAL LOW (ref 12.0–15.0)
Immature Granulocytes: 8 %
Lymphocytes Relative: 1 %
Lymphs Abs: 0.4 10*3/uL — ABNORMAL LOW (ref 0.7–4.0)
MCH: 28.7 pg (ref 26.0–34.0)
MCHC: 31.3 g/dL (ref 30.0–36.0)
MCV: 92 fL (ref 80.0–100.0)
Monocytes Absolute: 0.2 10*3/uL (ref 0.1–1.0)
Monocytes Relative: 0 %
NEUTROS PCT: 91 %
NRBC: 0 % (ref 0.0–0.2)
Neutro Abs: 78.3 10*3/uL — ABNORMAL HIGH (ref 1.7–7.7)
Platelets: 151 10*3/uL (ref 150–400)
RBC: 3.48 MIL/uL — AB (ref 3.87–5.11)
RDW: 21 % — ABNORMAL HIGH (ref 11.5–15.5)
WBC: 85.6 10*3/uL (ref 4.0–10.5)

## 2018-11-18 MED ORDER — SODIUM CHLORIDE 0.9% FLUSH
10.0000 mL | INTRAVENOUS | Status: DC | PRN
  Administered 2018-11-18: 10 mL
  Filled 2018-11-18: qty 10

## 2018-11-18 MED ORDER — HEPARIN SOD (PORK) LOCK FLUSH 100 UNIT/ML IV SOLN
500.0000 [IU] | Freq: Once | INTRAVENOUS | Status: AC | PRN
  Administered 2018-11-18: 250 [IU]
  Filled 2018-11-18: qty 5

## 2018-11-18 NOTE — Telephone Encounter (Signed)
Spoke with patient concerning her upcoming appointment. Per 1/20 los Will pick up today after her flush appt.

## 2018-11-18 NOTE — Telephone Encounter (Signed)
Left a detailed voice msg concerning patient upcoming appointments. Asking patient to pick up new schedule when she come in today. Per 1/23 los

## 2018-11-22 ENCOUNTER — Inpatient Hospital Stay: Payer: Medicaid Other

## 2018-11-22 ENCOUNTER — Ambulatory Visit (HOSPITAL_COMMUNITY)
Admission: RE | Admit: 2018-11-22 | Discharge: 2018-11-22 | Disposition: A | Discharge status: Home / Self Care | Payer: Medicaid Other | Source: Ambulatory Visit | Attending: Hematology | Admitting: Hematology

## 2018-11-22 DIAGNOSIS — C8190 Hodgkin lymphoma, unspecified, unspecified site: Secondary | ICD-10-CM | POA: Diagnosis not present

## 2018-11-22 DIAGNOSIS — Z95828 Presence of other vascular implants and grafts: Secondary | ICD-10-CM

## 2018-11-22 DIAGNOSIS — Z5112 Encounter for antineoplastic immunotherapy: Secondary | ICD-10-CM | POA: Diagnosis not present

## 2018-11-22 LAB — GLUCOSE, CAPILLARY: Glucose-Capillary: 99 mg/dL (ref 70–99)

## 2018-11-22 MED ORDER — SODIUM CHLORIDE 0.9% FLUSH
10.0000 mL | INTRAVENOUS | Status: DC | PRN
  Administered 2018-11-22: 10 mL
  Filled 2018-11-22: qty 10

## 2018-11-22 MED ORDER — FLUDEOXYGLUCOSE F - 18 (FDG) INJECTION
9.0000 | Freq: Once | INTRAVENOUS | Status: AC
  Administered 2018-11-22: 9 via INTRAVENOUS

## 2018-11-22 MED ORDER — HEPARIN SOD (PORK) LOCK FLUSH 100 UNIT/ML IV SOLN
500.0000 [IU] | Freq: Once | INTRAVENOUS | Status: AC | PRN
  Administered 2018-11-22: 500 [IU]
  Filled 2018-11-22: qty 5

## 2018-11-25 ENCOUNTER — Other Ambulatory Visit: Payer: Medicaid Other

## 2018-11-26 NOTE — Progress Notes (Signed)
HEMATOLOGY/ONCOLOGY CLINIC NOTE  Date of Service: 11/29/2018  Patient Care Team: Patient, No Pcp Per as PCP - General (General Practice)  CHIEF COMPLAINTS/PURPOSE OF CONSULTATION:  F/u for Mx of Hodgkins lymphoma  HISTORY OF PRESENTING ILLNESS:   Cassandra Clark is a wonderful 38 y.o. female who has been referred to [place] by Dr. 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   Patient has a mild anemia and presented to the emergency room with new onset fevers chills and a 2-week history of worsening night sweats dry cough fatigue and anorexia.  Patient notes that she initially started feeling unwell from late June when she had noted increasing fatigue that required her to rest for longer periods of time.  She also noted that she at times felt lightheaded and dizzy when she bent forward.  She notes a decreased appetite.  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 She notes that she has not been able to do much of anything else other than rest at home and work to some degree at her job.  No chest pain no palpitations no diaphoresis no lower extremity edema.  No abdominal pain no nausea no vomiting no diarrhea.  No rectal bleeding. Notes that her periods have been regular.  In the emergency room the patient was noted to have a fever of 102.4 F and tachycardia related to this.  Blood counts showed that the patient was anemic with a hemoglobin around 8 with normal platelets and a WBC count of 3.6k.  Magnesium 1.9 and phosphorus of 2. She was also noted to be vitamin B12 deficient.  Most recent lab results (08/04/18) of CBC w/diff and CMP is as follows: all values are WNL except for WBC at 3.0k RBC at 3.64, HGB at 8.3, HCT at 82.7, MCV at 78.8, MCH at 22.8, MCHC at 28.9, RDW at 18.1, PLT at 459k, Lymphs abs at 300, Calcium at  8.0, Albumin at 2.7. 08/04/18 LDH at 203 08/04/18 Vitamin B12 at 135  X-ray of the chest done on 08/03/2018 showed Right paratracheal and left AP window masses likely representing lymphadenopathy. Consider CT chest for further characterization.  CT of the chest with contrast was subsequently done which showed 1. 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 In addition there are multiple nodular lesions in the spleen and possibly in the liver, as well as permeative destruction of the sternal manubrium with tumor rind anterior and posterior to the sternum. 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 Left-sided small cell lung cancer might give a similar appearance. Aggressive germ-cell tumor or thymic carcinoma or less likely differential diagnostic considerations. Tissue diagnosis recommended. 2. The anterior mediastinal mass is causing narrowing of the SVC, and mild invasion of the SVC with slight marginal thrombosis is not readily excluded. No large pulmonary embolus is identified.  Patient had additional lab testing which showed a near normal LDH was 203. Sedimentation rate was done subsequently and was noted to be elevated.  Urine pregnancy test was within normal limits.  Patient was noted to have significant headaches over the last 4 to 6 weeks and had a CT of the head without contrast in the ED on 07/04/2018 which showed  no acute intracranial normalities.  On review of systems, pt reports fevers, unquantified weight loss, shortness of breath, night sweats and denies overt chest pain, rashes.   Interval History:   Cassandra Clark returns today for management, evaluation and C4D15 of her A-AVD treatment of her Hodgkin's Lymphoma. The patient's last visit with [place] was on 11/15/18. She is accompanied today by her father. The pt reports  that she is doing well overall.   The pt reports that she has not developed any concerns in the interim. She endorses some mild dry cough last night, and denies fevers, chills, phlegm production or other concerns. The pt denies any numbness or tingling in her hands or feet.  Of note since the patient's last visit, pt has had a PET/CT completed on 11/22/18 with results revealing Reduction in volume and no significant metabolic activity within anterior mediastinal nodes and LEFT axillary nodes ( Deauville 1). 2. No focal activity within the liver or spleen ( Deauville 1) 3. No new or progressive adenopathy. 4. Marked increase in marrow activity throughout the axillary and appendicular skeleton consistent with GCSF type response. 5. 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  Lab results today (11/29/18) of CBC w/diff and CMP is as follows: all values are WNL except for WBC at 15.1k, RBC at 3.47, HGB at 10.3, HCT at 32.6, RDW at 20.6, ANC at 10.8k, Glucose at 250, Albumin at 3.4, Total bilirubin at <0.2. 11/29/18 Magnesium is at 1.8 11/29/18 Vitamin B12 is wnl   On review of systems, pt reports breathing well, eating well, stable energy levels, moving her bowels well, and denies fevers, chills, phlegm production, tingling or numbness in her hands or feet, concerns for infections, abdominal pains, leg swelling, and any other symptoms.   MEDICAL HISTORY:  Past Medical History:  Diagnosis Date  . Anemia     SURGICAL HISTORY: Past Surgical History:  Procedure Laterality Date  . PICC LINE INSERTION Right 08/18/2018    SOCIAL HISTORY: Social History   Socioeconomic History  . Marital status: Legally Separated    Spouse name: Not on file  . Number of children: Not on file  . Years of education: Not on file  . Highest education level: Not on file  Occupational History  . Not on file  Social Needs  . Financial resource strain: Not on file  . Food insecurity:    Worry: Not on file     Inability: Not on file  . Transportation needs:    Medical: Not on file    Non-medical: Not on file  Tobacco Use  . Smoking status: Never Smoker  . Smokeless tobacco: Never Used  Substance and Sexual Activity  . Alcohol use: No  . Drug use: No    Comment: Drinks tea  . Sexual activity: Yes  Lifestyle  . Physical activity:    Days per week: Not on file    Minutes per session: Not on file  . Stress: Not on file  Relationships  . Social connections:    Talks on phone: Not on file    Gets together: Not on file    Attends religious service: Not on file    Active member of club or organization: Not on file    Attends meetings of clubs or organizations: Not on file    Relationship status: Not on file  . Intimate partner violence:    Fear of current or ex partner: Not on file    Emotionally  abused: Not on file    Physically abused: Not on file    Forced sexual activity: Not on file  Other Topics Concern  . Not on file  Social History Narrative  . Not on file    FAMILY HISTORY: Family History  Problem Relation Age of Onset  . Diabetes Mellitus II Father   . Hypertension Father   . Congestive Heart Failure Maternal Grandmother   . Diabetes Mellitus II Maternal Grandmother   . Diabetes Mellitus II Maternal Uncle   . Congestive Heart Failure Maternal Uncle     ALLERGIES:  is allergic to penicillins.  MEDICATIONS:  Current Outpatient Medications  Medication Sig Dispense Refill  . acetaminophen (TYLENOL) 325 MG tablet Take 2 tablets (650 mg total) by mouth every 6 (six) hours as needed for mild pain, fever or headache. 30 tablet 1  . albuterol (PROVENTIL) (2.5 MG/3ML) 0.083% nebulizer solution Take 3 mLs (2.5 mg total) by nebulization every 6 (six) hours as needed for wheezing or shortness of breath. 75 mL 12  . B Complex-C (B-COMPLEX WITH VITAMIN C) tablet Take 1 tablet by mouth daily. 30 tablet 3  . Cyanocobalamin (B-12) 1000 MCG SUBL Place 2,000 mcg under the tongue daily.  60 each 3  . dexamethasone (DECADRON) 4 MG tablet Take 2 tablets by mouth once a day on the day after chemotherapy and then take 2 tablets two times a day for 2 days. Take with food. 30 tablet 1  . ferrous sulfate 325 (65 FE) MG tablet Take 1 tablet (325 mg total) by mouth daily. (Patient not taking: Reported on 08/23/2018) 14 tablet 0  . guaiFENesin (MUCINEX) 600 MG 12 hr tablet Take 1 tablet (600 mg total) by mouth 2 (two) times daily. (Patient not taking: Reported on 11/01/2018) 20 tablet o  . HYDROcodone-acetaminophen (NORCO) 5-325 MG tablet Take 1-2 tablets by mouth every 6 (six) hours as needed for moderate pain or severe pain. 30 tablet 0  . lidocaine-prilocaine (EMLA) cream Apply to affected area once 30 g 3  . loratadine (CLARITIN) 10 MG tablet Take 10 mg by mouth daily.    Marland Kitchen LORazepam (ATIVAN) 0.5 MG tablet Take 1 tablet (0.5 mg total) by mouth every 6 (six) hours as needed (Nausea or vomiting). 30 tablet 0  . nystatin (MYCOSTATIN) 100000 UNIT/ML suspension Use as directed 5 mLs (500,000 Units total) in the mouth or throat 4 (four) times daily. (Patient not taking: Reported on 11/01/2018) 60 mL 0  . ondansetron (ZOFRAN) 8 MG tablet Take 1 tablet (8 mg total) by mouth 2 (two) times daily as needed. Start on the third day after chemotherapy. 30 tablet 1  . prochlorperazine (COMPAZINE) 10 MG tablet Take 1 tablet (10 mg total) by mouth every 6 (six) hours as needed (Nausea or vomiting). 30 tablet 1  . rivaroxaban (XARELTO) 10 MG TABS tablet Take 1 tablet (10 mg total) by mouth daily. 30 tablet 1   No current facility-administered medications for this visit.     REVIEW OF SYSTEMS:    A 10+ POINT REVIEW OF SYSTEMS WAS OBTAINED including neurology, dermatology, psychiatry, cardiac, respiratory, lymph, extremities, GI, GU, Musculoskeletal, constitutional, breasts, reproductive, HEENT.  All pertinent positives are noted in the HPI.  All others are negative.   PHYSICAL EXAMINATION: ECOG PERFORMANCE  STATUS: 1 - Symptomatic but completely ambulatory  Vitals:   11/29/18 1058  BP: 120/76  Pulse: (!) 119  Resp: 18  Temp: 97.8 F (36.6 C)  SpO2: 100%   Filed  Weights   11/29/18 1058  Weight: 173 lb 9.6 oz (78.7 kg)   .Body mass index is 27.19 kg/m.  GENERAL:alert, in no acute distress and comfortable SKIN: no acute rashes, no significant lesions EYES: conjunctiva are pink and non-injected, sclera anicteric OROPHARYNX: MMM, no exudates, no oropharyngeal erythema or ulceration NECK: supple, no JVD LYMPH:  no palpable lymphadenopathy in the cervical, axillary or inguinal regions LUNGS: clear to auscultation b/l with normal respiratory effort HEART: regular rate & rhythm ABDOMEN:  normoactive bowel sounds , non tender, not distended. No palpable hepatosplenomegaly.  Extremity: no pedal edema PSYCH: alert & oriented x 3 with fluent speech NEURO: no focal motor/sensory deficits   LABORATORY DATA:  I have reviewed the data as listed  . CBC Latest Ref Rng & Units 11/29/2018 11/18/2018 11/15/2018  WBC 4.0 - 10.5 K/uL 15.1(H) 85.6(HH) 7.1  Hemoglobin 12.0 - 15.0 g/dL 10.3(L) 10.0(L) 10.0(L)  Hematocrit 36.0 - 46.0 % 32.6(L) 32.0(L) 32.9(L)  Platelets 150 - 400 K/uL 248 151 205    . CMP Latest Ref Rng & Units 11/29/2018 11/18/2018 11/15/2018  Glucose 70 - 99 mg/dL 250(H) 165(H) 121(H)  BUN 6 - 20 mg/dL 10 15 12   Creatinine 0.44 - 1.00 mg/dL 0.81 0.68 0.73  Sodium 135 - 145 mmol/L 139 140 142  Potassium 3.5 - 5.1 mmol/L 3.8 3.8 4.1  Chloride 98 - 111 mmol/L 103 105 107  CO2 22 - 32 mmol/L 27 25 25   Calcium 8.9 - 10.3 mg/dL 9.1 9.1 9.2  Total Protein 6.5 - 8.1 g/dL 6.9 7.2 7.2  Total Bilirubin 0.3 - 1.2 mg/dL <0.2(L) 0.3 <0.2(L)  Alkaline Phos 38 - 126 U/L 122 127(H) 105  AST 15 - 41 U/L 16 19 16   ALT 0 - 44 U/L 31 34 35    08/04/18 Left Cervical Core Biopsy:   08/06/18 BM Bx:    RADIOGRAPHIC STUDIES: I have personally reviewed the radiological images as listed and agreed  with the findings in the report. Nm Pet Image Restag (ps) Skull Base To Thigh  Result Date: 11/22/2018 CLINICAL DATA:  Subsequent treatment strategy for lymphoma. Classical Hodgkin's lymphoma. Post 3 cycles a A-AVD. EXAM: NUCLEAR MEDICINE PET SKULL BASE TO THIGH TECHNIQUE: 9.0 mCi F-18 FDG was injected intravenously. Full-ring PET imaging was performed from the skull base to thigh after the radiotracer. CT data was obtained and used for attenuation correction and anatomic localization. Fasting blood glucose: 99 mg/dl COMPARISON:  PET-CT 08/13/2018 FINDINGS: Mediastinal blood pool activity: SUV max 1.57 NECK: No hypermetabolic lymph nodes in the neck. Incidental CT findings: none CHEST: Interval reduction in volume anterior mediastinal nodal mass. There is now only wispy tissue remaining without metabolic activity above background blood pool. Likewise reduction in volume of LEFT axial lymph nodes which are now normal size and without hypermetabolic activity Incidental CT findings: No pulmonary nodularity. RIGHT PICC line noted tip in distal SVC ABDOMEN/PELVIS: No abnormal hypermetabolic activity within the liver, pancreas, adrenal glands, or spleen. No hypermetabolic lymph nodes in the abdomen or pelvis. Normal volume spleen with normal metabolic activity. No residual activity within the caudate lobe of the liver. Incidental CT findings: Normal uterus. Fat containing lesion of the LEFT adnexa measures 6.6 x 5.1 cm and which does not have significant metabolic activity. No change from prior. SKELETON: Marked decrease in marrow activity throughout the axillary and appendicular skeleton consistent with GCSF type marrow response. Incidental CT findings: none IMPRESSION: 1. Reduction in volume and no significant metabolic activity within anterior  mediastinal nodes and LEFT axillary nodes ( Deauville 1). 2. No focal activity within the liver or spleen ( Deauville 1) 3. No new or progressive adenopathy. 4. Marked  increase in marrow activity throughout the axillary and appendicular skeleton consistent with GCSF type response. 5. 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 Electronically Signed   By: Suzy Bouchard M.D.   On: 11/22/2018 16:15    ASSESSMENT & PLAN:   38 y.o. 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 LDH - no significantly elevated. 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 With cytopenias concern for BM involvement with Hodgkins lymphoma  08/05/18 ECHO revealed LV EF of 65-70%. No pericardialeffusion.   08/06/18 BM Bx revealed involvement by Hodgkin's lymphoma  08/13/18 PET/CT revealed Large hypermetabolic mass within the anterior mediastinum identified compatible with lymphoma, Deauville criteria 5. There is extensive hypermetabolic left supraclavicular, left axillary, left hilar and right iliac adenopathy, Deauville criteria 4 and 5. Additional lesions are identified within the caudate lobe of liver and spleen, Deauville criteria 4. Multifocal hypermetabolic bone lesions are also identified, Deauville criteria 5   Pt began C1 AAVD on 08/09/18  -Delayed C3D15 for pneumonia admission on 10/16/18 to 10/19/18   2. Liver and splenic lesions concerning for involvement withCHL  3. S/p SVC compression due to anterior mediastinal mass - no upper extremity , neck or face swelling or change in visiion to suggest overt SVC syndrome  4. Microcytic anemia - normal iron. Likely anemia or chronic disease  5. B12 deficiency due to Pernicious anemia  PLAN:  -Discussed pt labwork today, 11/29/18; ANC higher at 10.8k in setting of Neulasta support, chemistries and magnesium stable    -Discussed the 11/22/18 PET/CT which revealed Reduction in volume and no significant metabolic activity within anterior mediastinal nodes and LEFT axillary nodes ( Deauville 1). 2. No focal activity within the liver or spleen ( Deauville 1) 3. No new or progressive adenopathy. 4. Marked increase in marrow activity throughout the axillary and appendicular skeleton consistent with GCSF type response. 5. 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 -The pt has no prohibitive toxicities from continuing C4D15 AAVD at this time.  -Salt and baking mouthwashes 4-5 times a day -Continue Vitamin B12 replacement2061mcg SL daily -Discussed again the recommendation to follow up with OBGYN after treatment for evaluation of the large dermoid tumor incidentally found on 08/13/18 PET/CT measuring 8.3cm within the left posterior pelvis -Planning to administer 6 cycles of A-AVD treatment given her Stage IV disease -Hydrocodone for as needed use with Udenycha related bone pains   -Will see the pt back in 4 weeks   Please schedule C5 and C6 of chemotherapy as per orders q2weeks with neulasta Portflush q2weeks with labs with each treatment RTC with Dr Irene Limbo in 4 weeks on C5D15   All of the patients questions were answered with apparent satisfaction. The patient knows to call the clinic with any problems, questions or concerns.  The total time spent in the appt was 25 minutes and more than 50% was on counseling and direct patient cares.    Sullivan Lone MD [place] AAHIVMS [facility] Hematology/Oncology Physician [facility]  (Office):       [phone] (Work cell):  [phone] (Fax):  [phone]  11/29/2018 11:45 AM  I, Baldwin Jamaica, am acting as a scribe for Dr. Sullivan Lone.   .I have reviewed the above documentation for accuracy and completeness, and I agree with the above. Brunetta Genera MD

## 2018-11-29 ENCOUNTER — Inpatient Hospital Stay: Payer: Medicaid Other

## 2018-11-29 ENCOUNTER — Inpatient Hospital Stay: Payer: Medicaid Other | Attending: Hematology | Admitting: Hematology

## 2018-11-29 VITALS — HR 96

## 2018-11-29 VITALS — BP 120/76 | HR 119 | Temp 97.8°F | Resp 18 | Ht 67.0 in | Wt 173.6 lb

## 2018-11-29 DIAGNOSIS — Z7901 Long term (current) use of anticoagulants: Secondary | ICD-10-CM | POA: Diagnosis not present

## 2018-11-29 DIAGNOSIS — C8198 Hodgkin lymphoma, unspecified, lymph nodes of multiple sites: Secondary | ICD-10-CM

## 2018-11-29 DIAGNOSIS — Z5111 Encounter for antineoplastic chemotherapy: Secondary | ICD-10-CM

## 2018-11-29 DIAGNOSIS — Z5112 Encounter for antineoplastic immunotherapy: Secondary | ICD-10-CM | POA: Insufficient documentation

## 2018-11-29 DIAGNOSIS — Z7189 Other specified counseling: Secondary | ICD-10-CM

## 2018-11-29 DIAGNOSIS — D510 Vitamin B12 deficiency anemia due to intrinsic factor deficiency: Secondary | ICD-10-CM | POA: Diagnosis not present

## 2018-11-29 DIAGNOSIS — D649 Anemia, unspecified: Secondary | ICD-10-CM

## 2018-11-29 DIAGNOSIS — I871 Compression of vein: Secondary | ICD-10-CM

## 2018-11-29 DIAGNOSIS — Z79899 Other long term (current) drug therapy: Secondary | ICD-10-CM | POA: Diagnosis not present

## 2018-11-29 DIAGNOSIS — C8190 Hodgkin lymphoma, unspecified, unspecified site: Secondary | ICD-10-CM

## 2018-11-29 DIAGNOSIS — Z95828 Presence of other vascular implants and grafts: Secondary | ICD-10-CM

## 2018-11-29 LAB — CBC WITH DIFFERENTIAL/PLATELET
Abs Immature Granulocytes: 1.74 10*3/uL — ABNORMAL HIGH (ref 0.00–0.07)
Basophils Absolute: 0.1 10*3/uL (ref 0.0–0.1)
Basophils Relative: 1 %
Eosinophils Absolute: 0 10*3/uL (ref 0.0–0.5)
Eosinophils Relative: 0 %
HCT: 32.6 % — ABNORMAL LOW (ref 36.0–46.0)
HEMOGLOBIN: 10.3 g/dL — AB (ref 12.0–15.0)
Immature Granulocytes: 12 %
LYMPHS PCT: 10 %
Lymphs Abs: 1.5 10*3/uL (ref 0.7–4.0)
MCH: 29.7 pg (ref 26.0–34.0)
MCHC: 31.6 g/dL (ref 30.0–36.0)
MCV: 93.9 fL (ref 80.0–100.0)
Monocytes Absolute: 0.9 10*3/uL (ref 0.1–1.0)
Monocytes Relative: 6 %
Neutro Abs: 10.8 10*3/uL — ABNORMAL HIGH (ref 1.7–7.7)
Neutrophils Relative %: 71 %
Platelets: 248 10*3/uL (ref 150–400)
RBC: 3.47 MIL/uL — ABNORMAL LOW (ref 3.87–5.11)
RDW: 20.6 % — ABNORMAL HIGH (ref 11.5–15.5)
WBC: 15.1 10*3/uL — ABNORMAL HIGH (ref 4.0–10.5)
nRBC: 0 % (ref 0.0–0.2)

## 2018-11-29 LAB — CMP (CANCER CENTER ONLY)
ALT: 31 U/L (ref 0–44)
AST: 16 U/L (ref 15–41)
Albumin: 3.4 g/dL — ABNORMAL LOW (ref 3.5–5.0)
Alkaline Phosphatase: 122 U/L (ref 38–126)
Anion gap: 9 (ref 5–15)
BUN: 10 mg/dL (ref 6–20)
CO2: 27 mmol/L (ref 22–32)
CREATININE: 0.81 mg/dL (ref 0.44–1.00)
Calcium: 9.1 mg/dL (ref 8.9–10.3)
Chloride: 103 mmol/L (ref 98–111)
GFR, Est AFR Am: >60 mL/min (ref >60)
GFR, Estimated: >60 mL/min (ref >60)
Glucose, Bld: 250 mg/dL — ABNORMAL HIGH (ref 70–99)
Potassium: 3.8 mmol/L (ref 3.5–5.1)
Sodium: 139 mmol/L (ref 135–145)
Total Bilirubin: <0.2 mg/dL — ABNORMAL LOW (ref 0.3–1.2)
Total Protein: 6.9 g/dL (ref 6.5–8.1)

## 2018-11-29 LAB — VITAMIN B12: Vitamin B-12: 5761 pg/mL — ABNORMAL HIGH (ref 180–914)

## 2018-11-29 LAB — MAGNESIUM: Magnesium: 1.8 mg/dL (ref 1.7–2.4)

## 2018-11-29 MED ORDER — SODIUM CHLORIDE 0.9% FLUSH
10.0000 mL | INTRAVENOUS | Status: DC | PRN
  Administered 2018-11-29: 10 mL
  Filled 2018-11-29: qty 10

## 2018-11-29 MED ORDER — DIPHENHYDRAMINE HCL 25 MG PO CAPS
ORAL_CAPSULE | ORAL | Status: AC
  Filled 2018-11-29: qty 1

## 2018-11-29 MED ORDER — ACETAMINOPHEN 325 MG PO TABS
650.0000 mg | ORAL_TABLET | Freq: Once | ORAL | Status: AC
  Administered 2018-11-29: 650 mg via ORAL

## 2018-11-29 MED ORDER — DIPHENHYDRAMINE HCL 25 MG PO TABS
25.0000 mg | ORAL_TABLET | Freq: Once | ORAL | Status: AC
  Administered 2018-11-29: 25 mg via ORAL
  Filled 2018-11-29: qty 1

## 2018-11-29 MED ORDER — SODIUM CHLORIDE 0.9 % IV SOLN
Freq: Once | INTRAVENOUS | Status: AC
  Administered 2018-11-29: 13:00:00 via INTRAVENOUS
  Filled 2018-11-29: qty 5

## 2018-11-29 MED ORDER — HEPARIN SOD (PORK) LOCK FLUSH 100 UNIT/ML IV SOLN
250.0000 [IU] | Freq: Once | INTRAVENOUS | Status: AC | PRN
  Administered 2018-11-29: 250 [IU]
  Filled 2018-11-29: qty 5

## 2018-11-29 MED ORDER — SODIUM CHLORIDE 0.9 % IV SOLN
375.0000 mg/m2 | Freq: Once | INTRAVENOUS | Status: AC
  Administered 2018-11-29: 690 mg via INTRAVENOUS
  Filled 2018-11-29: qty 69

## 2018-11-29 MED ORDER — SODIUM CHLORIDE 0.9 % IV SOLN
Freq: Once | INTRAVENOUS | Status: AC
  Administered 2018-11-29: 12:00:00 via INTRAVENOUS
  Filled 2018-11-29: qty 250

## 2018-11-29 MED ORDER — VINBLASTINE SULFATE CHEMO INJECTION 1 MG/ML
6.0000 mg/m2 | Freq: Once | INTRAVENOUS | Status: AC
  Administered 2018-11-29: 11 mg via INTRAVENOUS
  Filled 2018-11-29: qty 11

## 2018-11-29 MED ORDER — ACETAMINOPHEN 325 MG PO TABS
ORAL_TABLET | ORAL | Status: AC
  Filled 2018-11-29: qty 2

## 2018-11-29 MED ORDER — PALONOSETRON HCL INJECTION 0.25 MG/5ML
0.2500 mg | Freq: Once | INTRAVENOUS | Status: AC
  Administered 2018-11-29: 0.25 mg via INTRAVENOUS

## 2018-11-29 MED ORDER — HEPARIN SOD (PORK) LOCK FLUSH 100 UNIT/ML IV SOLN
500.0000 [IU] | Freq: Once | INTRAVENOUS | Status: AC | PRN
  Administered 2018-11-29: 500 [IU]
  Filled 2018-11-29: qty 5

## 2018-11-29 MED ORDER — DOXORUBICIN HCL CHEMO IV INJECTION 2 MG/ML
25.0000 mg/m2 | Freq: Once | INTRAVENOUS | Status: AC
  Administered 2018-11-29: 46 mg via INTRAVENOUS
  Filled 2018-11-29: qty 23

## 2018-11-29 MED ORDER — SODIUM CHLORIDE 0.9 % IV SOLN
1.2000 mg/kg | Freq: Once | INTRAVENOUS | Status: AC
  Administered 2018-11-29: 85 mg via INTRAVENOUS
  Filled 2018-11-29: qty 17

## 2018-11-29 MED ORDER — PALONOSETRON HCL INJECTION 0.25 MG/5ML
INTRAVENOUS | Status: AC
  Filled 2018-11-29: qty 5

## 2018-11-29 NOTE — Patient Instructions (Signed)
[place] Discharge Instructions for Patients Receiving Chemotherapy  Today you received the following chemotherapy agents: Adriamycin, Vinblastine, DTIC, & Adcetris.  To help prevent nausea and vomiting after your treatment, we encourage you to take your nausea medication as directed If you develop nausea and vomiting that is not controlled by your nausea medication, call the clinic.   BELOW ARE SYMPTOMS THAT SHOULD BE REPORTED IMMEDIATELY:  *FEVER GREATER THAN 100.5 F  *CHILLS WITH OR WITHOUT FEVER  NAUSEA AND VOMITING THAT IS NOT CONTROLLED WITH YOUR NAUSEA MEDICATION  *UNUSUAL SHORTNESS OF BREATH  *UNUSUAL BRUISING OR BLEEDING  TENDERNESS IN MOUTH AND THROAT WITH OR WITHOUT PRESENCE OF ULCERS  *URINARY PROBLEMS  *BOWEL PROBLEMS  UNUSUAL RASH Items with * indicate a potential emergency and should be followed up as soon as possible.  Feel free to call the clinic should you have any questions or concerns. The clinic phone number is [phone].  Please show the [place] at check-in to the Emergency Department and triage nurse.

## 2018-11-30 ENCOUNTER — Other Ambulatory Visit: Payer: Medicaid Other

## 2018-11-30 ENCOUNTER — Inpatient Hospital Stay: Payer: Medicaid Other

## 2018-11-30 ENCOUNTER — Telehealth: Payer: Self-pay | Admitting: Hematology

## 2018-11-30 ENCOUNTER — Telehealth: Payer: Self-pay

## 2018-11-30 NOTE — Telephone Encounter (Signed)
Per 2/3 schedule complete. Patient has MyChart

## 2018-11-30 NOTE — Telephone Encounter (Signed)
Scheduled appt per 2/4 sch message - pt is aware of appt date and time.  

## 2018-12-01 ENCOUNTER — Inpatient Hospital Stay: Payer: Medicaid Other

## 2018-12-01 DIAGNOSIS — Z7189 Other specified counseling: Secondary | ICD-10-CM

## 2018-12-01 DIAGNOSIS — Z5112 Encounter for antineoplastic immunotherapy: Secondary | ICD-10-CM | POA: Diagnosis not present

## 2018-12-01 DIAGNOSIS — Z95828 Presence of other vascular implants and grafts: Secondary | ICD-10-CM

## 2018-12-01 MED ORDER — HEPARIN SOD (PORK) LOCK FLUSH 100 UNIT/ML IV SOLN
500.0000 [IU] | Freq: Once | INTRAVENOUS | Status: AC | PRN
  Administered 2018-12-01: 500 [IU]
  Filled 2018-12-01: qty 5

## 2018-12-01 MED ORDER — PEGFILGRASTIM-CBQV 6 MG/0.6ML ~~LOC~~ SOSY
PREFILLED_SYRINGE | SUBCUTANEOUS | Status: AC
  Filled 2018-12-01: qty 0.6

## 2018-12-01 MED ORDER — PEGFILGRASTIM-CBQV 6 MG/0.6ML ~~LOC~~ SOSY
6.0000 mg | PREFILLED_SYRINGE | Freq: Once | SUBCUTANEOUS | Status: AC
  Administered 2018-12-01: 6 mg via SUBCUTANEOUS

## 2018-12-01 MED ORDER — SODIUM CHLORIDE 0.9% FLUSH
10.0000 mL | INTRAVENOUS | Status: DC | PRN
  Administered 2018-12-01: 10 mL
  Filled 2018-12-01: qty 10

## 2018-12-02 ENCOUNTER — Other Ambulatory Visit: Payer: Medicaid Other

## 2018-12-06 ENCOUNTER — Other Ambulatory Visit: Payer: Medicaid Other

## 2018-12-09 ENCOUNTER — Other Ambulatory Visit: Payer: Medicaid Other

## 2018-12-10 ENCOUNTER — Other Ambulatory Visit: Payer: Medicaid Other

## 2018-12-13 ENCOUNTER — Inpatient Hospital Stay: Payer: Medicaid Other

## 2018-12-13 ENCOUNTER — Inpatient Hospital Stay: Payer: Medicaid Other | Admitting: Hematology

## 2018-12-13 VITALS — BP 122/73 | HR 102 | Temp 98.4°F | Resp 16

## 2018-12-13 DIAGNOSIS — Z7189 Other specified counseling: Secondary | ICD-10-CM

## 2018-12-13 DIAGNOSIS — C8190 Hodgkin lymphoma, unspecified, unspecified site: Secondary | ICD-10-CM

## 2018-12-13 DIAGNOSIS — I871 Compression of vein: Secondary | ICD-10-CM

## 2018-12-13 DIAGNOSIS — Z5112 Encounter for antineoplastic immunotherapy: Secondary | ICD-10-CM | POA: Diagnosis not present

## 2018-12-13 DIAGNOSIS — Z95828 Presence of other vascular implants and grafts: Secondary | ICD-10-CM

## 2018-12-13 DIAGNOSIS — C8198 Hodgkin lymphoma, unspecified, lymph nodes of multiple sites: Secondary | ICD-10-CM

## 2018-12-13 LAB — CBC WITH DIFFERENTIAL/PLATELET
Abs Immature Granulocytes: 1.91 10*3/uL — ABNORMAL HIGH (ref 0.00–0.07)
Basophils Absolute: 0 10*3/uL (ref 0.0–0.1)
Basophils Relative: 0 %
EOS PCT: 0 %
Eosinophils Absolute: 0 10*3/uL (ref 0.0–0.5)
HCT: 31.1 % — ABNORMAL LOW (ref 36.0–46.0)
Hemoglobin: 9.8 g/dL — ABNORMAL LOW (ref 12.0–15.0)
IMMATURE GRANULOCYTES: 11 %
Lymphocytes Relative: 8 %
Lymphs Abs: 1.3 10*3/uL (ref 0.7–4.0)
MCH: 30.4 pg (ref 26.0–34.0)
MCHC: 31.5 g/dL (ref 30.0–36.0)
MCV: 96.6 fL (ref 80.0–100.0)
MONO ABS: 1 10*3/uL (ref 0.1–1.0)
Monocytes Relative: 6 %
Neutro Abs: 12.6 10*3/uL — ABNORMAL HIGH (ref 1.7–7.7)
Neutrophils Relative %: 75 %
Platelets: 232 10*3/uL (ref 150–400)
RBC: 3.22 MIL/uL — ABNORMAL LOW (ref 3.87–5.11)
RDW: 20.2 % — AB (ref 11.5–15.5)
WBC: 16.9 10*3/uL — ABNORMAL HIGH (ref 4.0–10.5)
nRBC: 0.2 % (ref 0.0–0.2)

## 2018-12-13 LAB — CMP (CANCER CENTER ONLY)
ALT: 30 U/L (ref 0–44)
AST: 17 U/L (ref 15–41)
Albumin: 3.3 g/dL — ABNORMAL LOW (ref 3.5–5.0)
Alkaline Phosphatase: 150 U/L — ABNORMAL HIGH (ref 38–126)
Anion gap: 12 (ref 5–15)
BUN: 15 mg/dL (ref 6–20)
CO2: 23 mmol/L (ref 22–32)
Calcium: 8.4 mg/dL — ABNORMAL LOW (ref 8.9–10.3)
Chloride: 102 mmol/L (ref 98–111)
Creatinine: 0.83 mg/dL (ref 0.44–1.00)
GFR, Est AFR Am: >60 mL/min (ref >60)
GFR, Estimated: >60 mL/min (ref >60)
Glucose, Bld: 306 mg/dL — ABNORMAL HIGH (ref 70–99)
Potassium: 3.8 mmol/L (ref 3.5–5.1)
Sodium: 137 mmol/L (ref 135–145)
TOTAL PROTEIN: 6.9 g/dL (ref 6.5–8.1)

## 2018-12-13 LAB — MAGNESIUM: Magnesium: 1.6 mg/dL — ABNORMAL LOW (ref 1.7–2.4)

## 2018-12-13 MED ORDER — SODIUM CHLORIDE 0.9% FLUSH
10.0000 mL | INTRAVENOUS | Status: DC | PRN
  Administered 2018-12-13: 10 mL
  Filled 2018-12-13: qty 10

## 2018-12-13 MED ORDER — PALONOSETRON HCL INJECTION 0.25 MG/5ML
0.2500 mg | Freq: Once | INTRAVENOUS | Status: AC
  Administered 2018-12-13: 0.25 mg via INTRAVENOUS

## 2018-12-13 MED ORDER — SODIUM CHLORIDE 0.9 % IV SOLN
Freq: Once | INTRAVENOUS | Status: AC
  Administered 2018-12-13: 13:00:00 via INTRAVENOUS
  Filled 2018-12-13: qty 5

## 2018-12-13 MED ORDER — SODIUM CHLORIDE 0.9% FLUSH
3.0000 mL | INTRAVENOUS | Status: DC | PRN
  Administered 2018-12-13: 3 mL
  Filled 2018-12-13: qty 10

## 2018-12-13 MED ORDER — SODIUM CHLORIDE 0.9 % IV SOLN
375.0000 mg/m2 | Freq: Once | INTRAVENOUS | Status: AC
  Administered 2018-12-13: 690 mg via INTRAVENOUS
  Filled 2018-12-13: qty 69

## 2018-12-13 MED ORDER — VINBLASTINE SULFATE CHEMO INJECTION 1 MG/ML
6.0000 mg/m2 | Freq: Once | INTRAVENOUS | Status: AC
  Administered 2018-12-13: 11 mg via INTRAVENOUS
  Filled 2018-12-13: qty 11

## 2018-12-13 MED ORDER — SODIUM CHLORIDE 0.9 % IV SOLN
Freq: Once | INTRAVENOUS | Status: AC
  Administered 2018-12-13: 13:00:00 via INTRAVENOUS
  Filled 2018-12-13: qty 250

## 2018-12-13 MED ORDER — HEPARIN SOD (PORK) LOCK FLUSH 100 UNIT/ML IV SOLN
250.0000 [IU] | Freq: Once | INTRAVENOUS | Status: AC | PRN
  Administered 2018-12-13: 250 [IU]
  Filled 2018-12-13: qty 5

## 2018-12-13 MED ORDER — DIPHENHYDRAMINE HCL 25 MG PO TABS
25.0000 mg | ORAL_TABLET | Freq: Once | ORAL | Status: AC
  Administered 2018-12-13: 25 mg via ORAL
  Filled 2018-12-13: qty 1

## 2018-12-13 MED ORDER — DOXORUBICIN HCL CHEMO IV INJECTION 2 MG/ML
25.0000 mg/m2 | Freq: Once | INTRAVENOUS | Status: AC
  Administered 2018-12-13: 46 mg via INTRAVENOUS
  Filled 2018-12-13: qty 23

## 2018-12-13 MED ORDER — ACETAMINOPHEN 325 MG PO TABS
ORAL_TABLET | ORAL | Status: AC
  Filled 2018-12-13: qty 2

## 2018-12-13 MED ORDER — ACETAMINOPHEN 325 MG PO TABS
650.0000 mg | ORAL_TABLET | Freq: Once | ORAL | Status: AC
  Administered 2018-12-13: 650 mg via ORAL

## 2018-12-13 MED ORDER — DIPHENHYDRAMINE HCL 25 MG PO CAPS
ORAL_CAPSULE | ORAL | Status: AC
  Filled 2018-12-13: qty 1

## 2018-12-13 MED ORDER — PALONOSETRON HCL INJECTION 0.25 MG/5ML
INTRAVENOUS | Status: AC
  Filled 2018-12-13: qty 5

## 2018-12-13 MED ORDER — SODIUM CHLORIDE 0.9 % IV SOLN
1.2000 mg/kg | Freq: Once | INTRAVENOUS | Status: AC
  Administered 2018-12-13: 85 mg via INTRAVENOUS
  Filled 2018-12-13: qty 17

## 2018-12-13 NOTE — Progress Notes (Signed)
Per Dr Irene Limbo OK to proceed with serum gluc of 306 and heart rate of 103.  Per Dr Irene Limbo decrease today's dexamethasone dose to 8mg s

## 2018-12-13 NOTE — Patient Instructions (Signed)
[place] Discharge Instructions for Patients Receiving Chemotherapy  Today you received the following chemotherapy agents:  Adriamycin, Vinblastine, Dacarbazine, Brentuximab  To help prevent nausea and vomiting after your treatment, we encourage you to take your nausea medication as prescribed.   If you develop nausea and vomiting that is not controlled by your nausea medication, call the clinic.   BELOW ARE SYMPTOMS THAT SHOULD BE REPORTED IMMEDIATELY:  *FEVER GREATER THAN 100.5 F  *CHILLS WITH OR WITHOUT FEVER  NAUSEA AND VOMITING THAT IS NOT CONTROLLED WITH YOUR NAUSEA MEDICATION  *UNUSUAL SHORTNESS OF BREATH  *UNUSUAL BRUISING OR BLEEDING  TENDERNESS IN MOUTH AND THROAT WITH OR WITHOUT PRESENCE OF ULCERS  *URINARY PROBLEMS  *BOWEL PROBLEMS  UNUSUAL RASH Items with * indicate a potential emergency and should be followed up as soon as possible.  Feel free to call the clinic should you have any questions or concerns. The clinic phone number is [phone].  Please show the [place] at check-in to the Emergency Department and triage nurse.

## 2018-12-13 NOTE — Progress Notes (Signed)
12/13/18  Orders received to decrease dexamethasone IV to 8 mg due to high blood sugars.  T.O. Dr Kale/Nicole RN/Audreena Sachdeva Ronnald Ramp, PharmD

## 2018-12-14 ENCOUNTER — Inpatient Hospital Stay: Payer: Medicaid Other

## 2018-12-15 ENCOUNTER — Inpatient Hospital Stay: Payer: Medicaid Other

## 2018-12-15 DIAGNOSIS — C8198 Hodgkin lymphoma, unspecified, lymph nodes of multiple sites: Secondary | ICD-10-CM

## 2018-12-15 DIAGNOSIS — Z7189 Other specified counseling: Secondary | ICD-10-CM

## 2018-12-15 DIAGNOSIS — I871 Compression of vein: Secondary | ICD-10-CM

## 2018-12-15 DIAGNOSIS — Z95828 Presence of other vascular implants and grafts: Secondary | ICD-10-CM

## 2018-12-15 DIAGNOSIS — Z5112 Encounter for antineoplastic immunotherapy: Secondary | ICD-10-CM | POA: Diagnosis not present

## 2018-12-15 MED ORDER — SODIUM CHLORIDE 0.9% FLUSH
10.0000 mL | INTRAVENOUS | Status: DC | PRN
  Administered 2018-12-15: 10 mL
  Filled 2018-12-15: qty 10

## 2018-12-15 MED ORDER — HEPARIN SOD (PORK) LOCK FLUSH 100 UNIT/ML IV SOLN
500.0000 [IU] | Freq: Once | INTRAVENOUS | Status: AC | PRN
  Administered 2018-12-15: 250 [IU]
  Filled 2018-12-15: qty 5

## 2018-12-15 MED ORDER — PEGFILGRASTIM-CBQV 6 MG/0.6ML ~~LOC~~ SOSY
6.0000 mg | PREFILLED_SYRINGE | Freq: Once | SUBCUTANEOUS | Status: AC
  Administered 2018-12-15: 6 mg via SUBCUTANEOUS

## 2018-12-24 NOTE — Progress Notes (Signed)
HEMATOLOGY/ONCOLOGY CLINIC NOTE  Date of Service: 12/27/2018  Patient Care Team: Patient, No Pcp Per as PCP - General (General Practice)  CHIEF COMPLAINTS/PURPOSE OF CONSULTATION:  F/u for Mx of Hodgkins lymphoma  HISTORY OF PRESENTING ILLNESS:   Cassandra Clark is a wonderful 38 y.o. female who has been referred to [place] by Dr. 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   Patient has a mild anemia and presented to the emergency room with new onset fevers chills and a 2-week history of worsening night sweats dry cough fatigue and anorexia.  Patient notes that she initially started feeling unwell from late June when she had noted increasing fatigue that required her to rest for longer periods of time.  She also noted that she at times felt lightheaded and dizzy when she bent forward.  She notes a decreased appetite.  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 She notes that she has not been able to do much of anything else other than rest at home and work to some degree at her job.  No chest pain no palpitations no diaphoresis no lower extremity edema.  No abdominal pain no nausea no vomiting no diarrhea.  No rectal bleeding. Notes that her periods have been regular.  In the emergency room the patient was noted to have a fever of 102.4 F and tachycardia related to this.  Blood counts showed that the patient was anemic with a hemoglobin around 8 with normal platelets and a WBC count of 3.6k.  Magnesium 1.9 and phosphorus of 2. She was also noted to be vitamin B12 deficient.  Most recent lab results (08/04/18) of CBC w/diff and CMP is as follows: all values are WNL except for WBC at 3.0k RBC at 3.64, HGB at 8.3, HCT at 82.7, MCV at 78.8, MCH at 22.8, MCHC at 28.9, RDW at 18.1, PLT at 459k, Lymphs abs at 300, Calcium at  8.0, Albumin at 2.7. 08/04/18 LDH at 203 08/04/18 Vitamin B12 at 135  X-ray of the chest done on 08/03/2018 showed Right paratracheal and left AP window masses likely representing lymphadenopathy. Consider CT chest for further characterization.  CT of the chest with contrast was subsequently done which showed 1. 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 In addition there are multiple nodular lesions in the spleen and possibly in the liver, as well as permeative destruction of the sternal manubrium with tumor rind anterior and posterior to the sternum. 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 Left-sided small cell lung cancer might give a similar appearance. Aggressive germ-cell tumor or thymic carcinoma or less likely differential diagnostic considerations. Tissue diagnosis recommended. 2. The anterior mediastinal mass is causing narrowing of the SVC, and mild invasion of the SVC with slight marginal thrombosis is not readily excluded. No large pulmonary embolus is identified.  Patient had additional lab testing which showed a near normal LDH was 203. Sedimentation rate was done subsequently and was noted to be elevated.  Urine pregnancy test was within normal limits.  Patient was noted to have significant headaches over the last 4 to 6 weeks and had a CT of the head without contrast in the ED on 07/04/2018 which showed  no acute intracranial normalities.  On review of systems, pt reports fevers, unquantified weight loss, shortness of breath, night sweats and denies overt chest pain, rashes.   Interval History:   Cassandra Clark returns today for management, evaluation and C5D15 of her A-AVD treatment of her Hodgkin's Lymphoma. The patient's last visit with [place] was on 11/29/2018. She is accompanied today by her family member today.  The pt reports that she is doing well overall. She is in need of her dexamethasone and B12 to be refilled, she hasn't had to use her antiemetics yet. She denies having any flushing for her PICC line. She hasn't had any discomfort to the area of her PICC line at this time.   Labs today, 12/27/2018 consisted of CBC, CMP, and magnesium show: CBC with WBC at 12.8, RBC at 3.57, and Hgb at 10.8. CMP and Magnesium noted.   On review of systems, she reports intermittent numbness/tingling. she denies SOB, CP, weight change, appetite change, mouth sores, and any other symptoms. Pertinent positives are listed and detailed within the above HPI.   MEDICAL HISTORY:  Past Medical History:  Diagnosis Date  . Anemia     SURGICAL HISTORY: Past Surgical History:  Procedure Laterality Date  . PICC LINE INSERTION Right 08/18/2018    SOCIAL HISTORY: Social History   Socioeconomic History  . Marital status: Legally Separated    Spouse name: Not on file  . Number of children: Not on file  . Years of education: Not on file  . Highest education level: Not on file  Occupational History  . Not on file  Social Needs  . Financial resource strain: Not on file  . Food insecurity:    Worry: Not on file    Inability: Not on file  . Transportation needs:    Medical: Not on file    Non-medical: Not on file  Tobacco Use  . Smoking status: Never Smoker  . Smokeless tobacco: Never Used  Substance and Sexual Activity  . Alcohol use: No  . Drug use: No    Comment: Drinks tea  . Sexual activity: Yes  Lifestyle  . Physical activity:    Days per week: Not on file    Minutes per session: Not on file  . Stress: Not on file  Relationships  . Social connections:    Talks on phone: Not on file    Gets together: Not on file    Attends religious service: Not on file    Active member of club or organization: Not on file    Attends meetings of clubs or organizations: Not on file    Relationship status: Not on  file  . Intimate partner violence:    Fear of current or ex partner: Not on file    Emotionally abused: Not on file    Physically abused: Not on file    Forced sexual activity: Not on file  Other Topics Concern  . Not on file  Social History Narrative  . Not on file    FAMILY HISTORY: Family History  Problem Relation Age of Onset  . Diabetes Mellitus II Father   . Hypertension Father   . Congestive Heart Failure Maternal Grandmother   . Diabetes Mellitus II Maternal Grandmother   . Diabetes Mellitus II Maternal Uncle   . Congestive Heart Failure Maternal Uncle     ALLERGIES:  is allergic to penicillins.  MEDICATIONS:  Current Outpatient Medications  Medication Sig Dispense Refill  . acetaminophen (TYLENOL) 325  MG tablet Take 2 tablets (650 mg total) by mouth every 6 (six) hours as needed for mild pain, fever or headache. 30 tablet 1  . albuterol (PROVENTIL) (2.5 MG/3ML) 0.083% nebulizer solution Take 3 mLs (2.5 mg total) by nebulization every 6 (six) hours as needed for wheezing or shortness of breath. 75 mL 12  . B Complex-C (B-COMPLEX WITH VITAMIN C) tablet Take 1 tablet by mouth daily. 30 tablet 3  . Cyanocobalamin (B-12) 1000 MCG SUBL Place 2,000 mcg under the tongue daily. 60 each 3  . dexamethasone (DECADRON) 4 MG tablet Take 2 tablets by mouth once a day on the day after chemotherapy and then take 2 tablets two times a day for 2 days. Take with food. 30 tablet 1  . ferrous sulfate 325 (65 FE) MG tablet Take 1 tablet (325 mg total) by mouth daily. (Patient not taking: Reported on 08/23/2018) 14 tablet 0  . guaiFENesin (MUCINEX) 600 MG 12 hr tablet Take 1 tablet (600 mg total) by mouth 2 (two) times daily. (Patient not taking: Reported on 11/01/2018) 20 tablet o  . HYDROcodone-acetaminophen (NORCO) 5-325 MG tablet Take 1-2 tablets by mouth every 6 (six) hours as needed for moderate pain or severe pain. 30 tablet 0  . lidocaine-prilocaine (EMLA) cream Apply to affected area once  30 g 3  . loratadine (CLARITIN) 10 MG tablet Take 10 mg by mouth daily.    Marland Kitchen LORazepam (ATIVAN) 0.5 MG tablet Take 1 tablet (0.5 mg total) by mouth every 6 (six) hours as needed (Nausea or vomiting). 30 tablet 0  . nystatin (MYCOSTATIN) 100000 UNIT/ML suspension Use as directed 5 mLs (500,000 Units total) in the mouth or throat 4 (four) times daily. (Patient not taking: Reported on 11/01/2018) 60 mL 0  . ondansetron (ZOFRAN) 8 MG tablet Take 1 tablet (8 mg total) by mouth 2 (two) times daily as needed. Start on the third day after chemotherapy. 30 tablet 1  . prochlorperazine (COMPAZINE) 10 MG tablet Take 1 tablet (10 mg total) by mouth every 6 (six) hours as needed (Nausea or vomiting). 30 tablet 1  . rivaroxaban (XARELTO) 10 MG TABS tablet Take 1 tablet (10 mg total) by mouth daily. 30 tablet 1   No current facility-administered medications for this visit.     REVIEW OF SYSTEMS:    A 10+ POINT REVIEW OF SYSTEMS WAS OBTAINED including neurology, dermatology, psychiatry, cardiac, respiratory, lymph, extremities, GI, GU, Musculoskeletal, constitutional, breasts, reproductive, HEENT.  All pertinent positives are noted in the HPI.  All others are negative.   PHYSICAL EXAMINATION:  ECOG PERFORMANCE STATUS: 1 - Symptomatic but completely ambulatory  Vitals:   12/27/18 1200  BP: 130/82  Pulse: (!) 109  Resp: 18  Temp: 97.9 F (36.6 C)  SpO2: 100%   Filed Weights   12/27/18 1200  Weight: 175 lb 9.6 oz (79.7 kg)   .Body mass index is 27.5 kg/m.  GENERAL:alert, in no acute distress and comfortable SKIN: no acute rashes, no significant lesions EYES: conjunctiva are pink and non-injected, sclera anicteric OROPHARYNX: MMM, no exudates, no oropharyngeal erythema or ulceration NECK: supple, no JVD LYMPH:  no palpable lymphadenopathy in the cervical, axillary or inguinal regions LUNGS: clear to auscultation b/l with normal respiratory effort HEART: regular rate & rhythm ABDOMEN:  normoactive  bowel sounds , non tender, not distended. No palpable hepatosplenomegaly.  Extremity: no pedal edema PSYCH: alert & oriented x 3 with fluent speech NEURO: no focal motor/sensory deficits   LABORATORY DATA:  I have reviewed the data as listed  CBC Latest Ref Rng & Units 12/27/2018 12/13/2018 11/29/2018  WBC 4.0 - 10.5 K/uL 12.8(H) 16.9(H) 15.1(H)  Hemoglobin 12.0 - 15.0 g/dL 10.8(L) 9.8(L) 10.3(L)  Hematocrit 36.0 - 46.0 % 34.7(L) 31.1(L) 32.6(L)  Platelets 150 - 400 K/uL 239 232 248    . CMP Latest Ref Rng & Units 12/27/2018 12/13/2018 11/29/2018  Glucose 70 - 99 mg/dL 273(H) 306(H) 250(H)  BUN 6 - 20 mg/dL 10 15 10   Creatinine 0.44 - 1.00 mg/dL 0.80 0.83 0.81  Sodium 135 - 145 mmol/L 138 137 139  Potassium 3.5 - 5.1 mmol/L 3.9 3.8 3.8  Chloride 98 - 111 mmol/L 102 102 103  CO2 22 - 32 mmol/L 23 23 27   Calcium 8.9 - 10.3 mg/dL 9.0 8.4(L) 9.1  Total Protein 6.5 - 8.1 g/dL 7.2 6.9 6.9  Total Bilirubin 0.3 - 1.2 mg/dL <0.2(L) <0.2(L) <0.2(L)  Alkaline Phos 38 - 126 U/L 142(H) 150(H) 122  AST 15 - 41 U/L 21 17 16   ALT 0 - 44 U/L 45(H) 30 31    08/04/18 Left Cervical Core Biopsy:   08/06/18 BM Bx:    RADIOGRAPHIC STUDIES: I have personally reviewed the radiological images as listed and agreed with the findings in the report. No results found.  ASSESSMENT & PLAN:   38 y.o. 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 LDH - no significantly elevated. 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 With cytopenias concern for BM involvement with Hodgkins lymphoma  08/05/18 ECHO revealed LV EF of 65-70%. No pericardialeffusion.   08/06/18 BM Bx revealed involvement by Hodgkin's  lymphoma  08/13/18 PET/CT revealed Large hypermetabolic mass within the anterior mediastinum identified compatible with lymphoma, Deauville criteria 5. There is extensive hypermetabolic left supraclavicular, left axillary, left hilar and right iliac adenopathy, Deauville criteria 4 and 5. Additional lesions are identified within the caudate lobe of liver and spleen, Deauville criteria 4. Multifocal hypermetabolic bone lesions are also identified, Deauville criteria 5   Pt began C1 AAVD on 08/09/18  -Delayed C3D15 for pneumonia admission on 10/16/18 to 10/19/18   2. Liver and splenic lesions concerning for involvement withCHL  3. S/p SVC compression due to anterior mediastinal mass - no upper extremity , neck or face swelling or change in visiion to suggest overt SVC syndrome  4. Microcytic anemia - normal iron. Likely anemia or chronic disease  5. B12 deficiency due to Pernicious anemia  PLAN:  -Discussed pt lab work today, 12/27/2018 consisted of CBC, CMP, and magnesium show: CBC with WBC at 12.8, RBC at 3.57, and Hgb at 10.8. CMP and Magnesium reviewed -the pt has no prohibitive toxicities from continuing [place] at this time.  -After cycle 6, the patient will undergo a PET/CT scan. However if the PET/CT scan shows complete response, then there wouldn't be a need for a follow up with radiation oncology. Discussed that we could just observe this if needed. Also noted that we will discuss this further following the last cycle.  -Salt and baking mouthwashes 4-5 times a day -Continue Vitamin B12 replacement208mcg SL daily -Planning to administer 6 cycles of A-AVD treatment given her Stage IV disease -Hydrocodone for as needed use with Udenycha related bone pains   -Will see the pt back  in 4 weeks   F/u for C6D1 and C6D15 treatment as scheduled with labs and MD visit on C6D15 PICC line flushes    All of the patients questions were answered with apparent satisfaction. The  patient knows to call the clinic with any problems, questions or concerns.  The total time spent in the appt was 25 minutes and more than 50% was on counseling and direct patient cares.    Sullivan Lone MD MS AAHIVMS [facility] Hematology/Oncology Physician [facility]  (Office):       [phone] (Work cell):  [phone] (Fax):           [phone]  12/27/2018 12:06 PM  I, Soijett Blue am acting as scribe for Dr. Sullivan Lone.  .I have reviewed the above documentation for accuracy and completeness, and I agree with the above. Brunetta Genera MD

## 2018-12-27 ENCOUNTER — Inpatient Hospital Stay: Payer: Medicaid Other

## 2018-12-27 ENCOUNTER — Inpatient Hospital Stay: Payer: Medicaid Other | Attending: Hematology | Admitting: Hematology

## 2018-12-27 VITALS — HR 101

## 2018-12-27 VITALS — BP 130/82 | HR 109 | Temp 97.9°F | Resp 18 | Ht 67.0 in | Wt 175.6 lb

## 2018-12-27 DIAGNOSIS — R739 Hyperglycemia, unspecified: Secondary | ICD-10-CM | POA: Diagnosis not present

## 2018-12-27 DIAGNOSIS — Z5189 Encounter for other specified aftercare: Secondary | ICD-10-CM | POA: Insufficient documentation

## 2018-12-27 DIAGNOSIS — Z5112 Encounter for antineoplastic immunotherapy: Secondary | ICD-10-CM | POA: Insufficient documentation

## 2018-12-27 DIAGNOSIS — D510 Vitamin B12 deficiency anemia due to intrinsic factor deficiency: Secondary | ICD-10-CM | POA: Diagnosis not present

## 2018-12-27 DIAGNOSIS — Z7189 Other specified counseling: Secondary | ICD-10-CM

## 2018-12-27 DIAGNOSIS — C8198 Hodgkin lymphoma, unspecified, lymph nodes of multiple sites: Secondary | ICD-10-CM

## 2018-12-27 DIAGNOSIS — C8190 Hodgkin lymphoma, unspecified, unspecified site: Secondary | ICD-10-CM

## 2018-12-27 DIAGNOSIS — D509 Iron deficiency anemia, unspecified: Secondary | ICD-10-CM | POA: Diagnosis not present

## 2018-12-27 DIAGNOSIS — Z5111 Encounter for antineoplastic chemotherapy: Secondary | ICD-10-CM | POA: Diagnosis not present

## 2018-12-27 DIAGNOSIS — I871 Compression of vein: Secondary | ICD-10-CM

## 2018-12-27 DIAGNOSIS — D649 Anemia, unspecified: Secondary | ICD-10-CM

## 2018-12-27 DIAGNOSIS — Z95828 Presence of other vascular implants and grafts: Secondary | ICD-10-CM

## 2018-12-27 DIAGNOSIS — Z7901 Long term (current) use of anticoagulants: Secondary | ICD-10-CM | POA: Insufficient documentation

## 2018-12-27 LAB — CMP (CANCER CENTER ONLY)
ALT: 45 U/L — ABNORMAL HIGH (ref 0–44)
AST: 21 U/L (ref 15–41)
Albumin: 3.6 g/dL (ref 3.5–5.0)
Alkaline Phosphatase: 142 U/L — ABNORMAL HIGH (ref 38–126)
Anion gap: 13 (ref 5–15)
BUN: 10 mg/dL (ref 6–20)
CO2: 23 mmol/L (ref 22–32)
Calcium: 9 mg/dL (ref 8.9–10.3)
Chloride: 102 mmol/L (ref 98–111)
Creatinine: 0.8 mg/dL (ref 0.44–1.00)
GFR, Est AFR Am: >60 mL/min (ref >60)
GFR, Estimated: >60 mL/min (ref >60)
Glucose, Bld: 273 mg/dL — ABNORMAL HIGH (ref 70–99)
POTASSIUM: 3.9 mmol/L (ref 3.5–5.1)
Sodium: 138 mmol/L (ref 135–145)
Total Bilirubin: <0.2 mg/dL — ABNORMAL LOW (ref 0.3–1.2)
Total Protein: 7.2 g/dL (ref 6.5–8.1)

## 2018-12-27 LAB — CBC WITH DIFFERENTIAL/PLATELET
Abs Immature Granulocytes: 1.47 10*3/uL — ABNORMAL HIGH (ref 0.00–0.07)
Basophils Absolute: 0 10*3/uL (ref 0.0–0.1)
Basophils Relative: 0 %
Eosinophils Absolute: 0 10*3/uL (ref 0.0–0.5)
Eosinophils Relative: 0 %
HEMATOCRIT: 34.7 % — AB (ref 36.0–46.0)
Hemoglobin: 10.8 g/dL — ABNORMAL LOW (ref 12.0–15.0)
Immature Granulocytes: 12 %
LYMPHS ABS: 1.3 10*3/uL (ref 0.7–4.0)
Lymphocytes Relative: 10 %
MCH: 30.3 pg (ref 26.0–34.0)
MCHC: 31.1 g/dL (ref 30.0–36.0)
MCV: 97.2 fL (ref 80.0–100.0)
MONOS PCT: 5 %
Monocytes Absolute: 0.6 10*3/uL (ref 0.1–1.0)
Neutro Abs: 9.3 10*3/uL — ABNORMAL HIGH (ref 1.7–7.7)
Neutrophils Relative %: 73 %
Platelets: 239 10*3/uL (ref 150–400)
RBC: 3.57 MIL/uL — ABNORMAL LOW (ref 3.87–5.11)
RDW: 19.9 % — ABNORMAL HIGH (ref 11.5–15.5)
WBC: 12.8 10*3/uL — ABNORMAL HIGH (ref 4.0–10.5)
nRBC: 0.2 % (ref 0.0–0.2)

## 2018-12-27 LAB — MAGNESIUM: Magnesium: 1.6 mg/dL — ABNORMAL LOW (ref 1.7–2.4)

## 2018-12-27 MED ORDER — DIPHENHYDRAMINE HCL 25 MG PO CAPS
ORAL_CAPSULE | ORAL | Status: AC
  Filled 2018-12-27: qty 1

## 2018-12-27 MED ORDER — ACETAMINOPHEN 325 MG PO TABS
ORAL_TABLET | ORAL | Status: AC
  Filled 2018-12-27: qty 2

## 2018-12-27 MED ORDER — SODIUM CHLORIDE 0.9 % IV SOLN
Freq: Once | INTRAVENOUS | Status: AC
  Administered 2018-12-27: 13:00:00 via INTRAVENOUS
  Filled 2018-12-27: qty 5

## 2018-12-27 MED ORDER — DEXAMETHASONE 4 MG PO TABS: ORAL_TABLET | ORAL | 1 refills | Status: DC

## 2018-12-27 MED ORDER — ACETAMINOPHEN 325 MG PO TABS
650.0000 mg | ORAL_TABLET | Freq: Once | ORAL | Status: AC
  Administered 2018-12-27: 650 mg via ORAL

## 2018-12-27 MED ORDER — VINBLASTINE SULFATE CHEMO INJECTION 1 MG/ML
6.0000 mg/m2 | Freq: Once | INTRAVENOUS | Status: AC
  Administered 2018-12-27: 11 mg via INTRAVENOUS
  Filled 2018-12-27: qty 11

## 2018-12-27 MED ORDER — SODIUM CHLORIDE 0.9 % IV SOLN
95.0000 mg | Freq: Once | INTRAVENOUS | Status: AC
  Administered 2018-12-27: 95 mg via INTRAVENOUS
  Filled 2018-12-27: qty 19

## 2018-12-27 MED ORDER — HEPARIN SOD (PORK) LOCK FLUSH 100 UNIT/ML IV SOLN
250.0000 [IU] | Freq: Once | INTRAVENOUS | Status: AC | PRN
  Administered 2018-12-27: 250 [IU]
  Filled 2018-12-27: qty 5

## 2018-12-27 MED ORDER — SODIUM CHLORIDE 0.9 % IV SOLN
375.0000 mg/m2 | Freq: Once | INTRAVENOUS | Status: AC
  Administered 2018-12-27: 690 mg via INTRAVENOUS
  Filled 2018-12-27: qty 69

## 2018-12-27 MED ORDER — DIPHENHYDRAMINE HCL 25 MG PO CAPS
25.0000 mg | ORAL_CAPSULE | Freq: Once | ORAL | Status: AC
  Administered 2018-12-27: 25 mg via ORAL
  Filled 2018-12-27: qty 1

## 2018-12-27 MED ORDER — SODIUM CHLORIDE 0.9% FLUSH
10.0000 mL | INTRAVENOUS | Status: DC | PRN
  Administered 2018-12-27: 10 mL
  Filled 2018-12-27: qty 10

## 2018-12-27 MED ORDER — PALONOSETRON HCL INJECTION 0.25 MG/5ML
0.2500 mg | Freq: Once | INTRAVENOUS | Status: AC
  Administered 2018-12-27: 0.25 mg via INTRAVENOUS

## 2018-12-27 MED ORDER — PALONOSETRON HCL INJECTION 0.25 MG/5ML
INTRAVENOUS | Status: AC
  Filled 2018-12-27: qty 5

## 2018-12-27 MED ORDER — DOXORUBICIN HCL CHEMO IV INJECTION 2 MG/ML
25.0000 mg/m2 | Freq: Once | INTRAVENOUS | Status: AC
  Administered 2018-12-27: 46 mg via INTRAVENOUS
  Filled 2018-12-27: qty 23

## 2018-12-27 MED ORDER — SODIUM CHLORIDE 0.9 % IV SOLN
Freq: Once | INTRAVENOUS | Status: AC
  Administered 2018-12-27: 13:00:00 via INTRAVENOUS
  Filled 2018-12-27: qty 250

## 2018-12-27 MED ORDER — B-12 1000 MCG SL SUBL: 2000.0000 ug | SUBLINGUAL_TABLET | Freq: Every day | SUBLINGUAL | 3 refills | Status: AC

## 2018-12-27 MED FILL — DEXAMETHASONE 4 MG TABLET: 4 | 7 days supply | Qty: 30 | Fill #0

## 2018-12-27 NOTE — Patient Instructions (Signed)
Page Discharge Instructions for Patients Receiving Chemotherapy  Today you received the following chemotherapy agents Adriamycin, Velban, DTIC, Adcentris  To help prevent nausea and vomiting after your treatment, we encourage you to take your nausea medication as directed. No Zofran for 3 days. Take Compazine instead.    If you develop nausea and vomiting that is not controlled by your nausea medication, call the clinic.   BELOW ARE SYMPTOMS THAT SHOULD BE REPORTED IMMEDIATELY:  *FEVER GREATER THAN 100.5 F  *CHILLS WITH OR WITHOUT FEVER  NAUSEA AND VOMITING THAT IS NOT CONTROLLED WITH YOUR NAUSEA MEDICATION  *UNUSUAL SHORTNESS OF BREATH  *UNUSUAL BRUISING OR BLEEDING  TENDERNESS IN MOUTH AND THROAT WITH OR WITHOUT PRESENCE OF ULCERS  *URINARY PROBLEMS  *BOWEL PROBLEMS  UNUSUAL RASH Items with * indicate a potential emergency and should be followed up as soon as possible.  Feel free to call the clinic should you have any questions or concerns. The clinic phone number is [phone].  Please show the [place] at check-in to the Emergency Department and triage nurse.

## 2018-12-27 NOTE — Progress Notes (Signed)
Per Irene Limbo, increase adcetris dose for weight gain. Orders updated.   Demetrius Charity, PharmD, [facility] Pharmacist Pharmacy Phone: [phone] 12/27/2018

## 2018-12-27 NOTE — Progress Notes (Signed)
OK to treat with labs today and for blood sugar, Decadron will be reduced to 10mg , verbal order from [place] and pharmacy aware.

## 2018-12-28 ENCOUNTER — Inpatient Hospital Stay: Payer: Medicaid Other

## 2018-12-28 ENCOUNTER — Telehealth: Payer: Self-pay | Admitting: Hematology

## 2018-12-28 NOTE — Telephone Encounter (Signed)
R/s appt per 3/3 sch mesasge - left message for patient with apt date and time

## 2018-12-29 ENCOUNTER — Inpatient Hospital Stay: Payer: Medicaid Other

## 2018-12-29 ENCOUNTER — Ambulatory Visit: Payer: Medicaid Other

## 2018-12-29 VITALS — BP 126/73 | HR 90 | Temp 98.1°F | Resp 18

## 2018-12-29 DIAGNOSIS — Z7189 Other specified counseling: Secondary | ICD-10-CM

## 2018-12-29 DIAGNOSIS — Z95828 Presence of other vascular implants and grafts: Secondary | ICD-10-CM

## 2018-12-29 DIAGNOSIS — I871 Compression of vein: Secondary | ICD-10-CM

## 2018-12-29 DIAGNOSIS — Z5112 Encounter for antineoplastic immunotherapy: Secondary | ICD-10-CM | POA: Diagnosis not present

## 2018-12-29 DIAGNOSIS — C8198 Hodgkin lymphoma, unspecified, lymph nodes of multiple sites: Secondary | ICD-10-CM

## 2018-12-29 MED ORDER — HEPARIN SOD (PORK) LOCK FLUSH 100 UNIT/ML IV SOLN
500.0000 [IU] | Freq: Once | INTRAVENOUS | Status: AC | PRN
  Administered 2018-12-29: 250 [IU]
  Filled 2018-12-29: qty 5

## 2018-12-29 MED ORDER — PEGFILGRASTIM INJECTION 6 MG/0.6ML ~~LOC~~
6.0000 mg | PREFILLED_SYRINGE | Freq: Once | SUBCUTANEOUS | Status: AC
  Administered 2018-12-29: 6 mg via SUBCUTANEOUS

## 2018-12-29 MED ORDER — SODIUM CHLORIDE 0.9% FLUSH
10.0000 mL | INTRAVENOUS | Status: DC | PRN
  Administered 2018-12-29: 10 mL
  Filled 2018-12-29: qty 10

## 2018-12-29 MED ORDER — PEGFILGRASTIM INJECTION 6 MG/0.6ML ~~LOC~~
PREFILLED_SYRINGE | SUBCUTANEOUS | Status: AC
  Filled 2018-12-29: qty 0.6

## 2018-12-29 NOTE — Patient Instructions (Signed)
Pegfilgrastim injection  What is this medicine?  PEGFILGRASTIM (PEG fil gra stim) is a long-acting granulocyte colony-stimulating factor that stimulates the growth of neutrophils, a type of white blood cell important in the body's fight against infection. It is used to reduce the incidence of fever and infection in patients with certain types of cancer who are receiving chemotherapy that affects the bone marrow, and to increase survival after being exposed to high doses of radiation.  This medicine may be used for other purposes; ask your health care provider or pharmacist if you have questions.  COMMON BRAND NAME(S): Fulphila, Neulasta, UDENYCA  What should I tell my health care provider before I take this medicine?  They need to know if you have any of these conditions:  -kidney disease  -latex allergy  -ongoing radiation therapy  -sickle cell disease  -skin reactions to acrylic adhesives (On-Body Injector only)  -an unusual or allergic reaction to pegfilgrastim, filgrastim, other medicines, foods, dyes, or preservatives  -pregnant or trying to get pregnant  -breast-feeding  How should I use this medicine?  This medicine is for injection under the skin. If you get this medicine at home, you will be taught how to prepare and give the pre-filled syringe or how to use the On-body Injector. Refer to the patient Instructions for Use for detailed instructions. Use exactly as directed. Tell your healthcare provider immediately if you suspect that the On-body Injector may not have performed as intended or if you suspect the use of the On-body Injector resulted in a missed or partial dose.  It is important that you put your used needles and syringes in a special sharps container. Do not put them in a trash can. If you do not have a sharps container, call your pharmacist or healthcare provider to get one.  Talk to your pediatrician regarding the use of this medicine in children. While this drug may be prescribed for  selected conditions, precautions do apply.  Overdosage: If you think you have taken too much of this medicine contact a poison control center or emergency room at once.  NOTE: This medicine is only for you. Do not share this medicine with others.  What if I miss a dose?  It is important not to miss your dose. Call your doctor or health care professional if you miss your dose. If you miss a dose due to an On-body Injector failure or leakage, a new dose should be administered as soon as possible using a single prefilled syringe for manual use.  What may interact with this medicine?  Interactions have not been studied.  Give your health care provider a list of all the medicines, herbs, non-prescription drugs, or dietary supplements you use. Also tell them if you smoke, drink alcohol, or use illegal drugs. Some items may interact with your medicine.  This list may not describe all possible interactions. Give your health care provider a list of all the medicines, herbs, non-prescription drugs, or dietary supplements you use. Also tell them if you smoke, drink alcohol, or use illegal drugs. Some items may interact with your medicine.  What should I watch for while using this medicine?  You may need blood work done while you are taking this medicine.  If you are going to need a MRI, CT scan, or other procedure, tell your doctor that you are using this medicine (On-Body Injector only).  What side effects may I notice from receiving this medicine?  Side effects that you should report to   your doctor or health care professional as soon as possible:  -allergic reactions like skin rash, itching or hives, swelling of the face, lips, or tongue  -back pain  -dizziness  -fever  -pain, redness, or irritation at site where injected  -pinpoint red spots on the skin  -red or dark-brown urine  -shortness of breath or breathing problems  -stomach or side pain, or pain at the shoulder  -swelling  -tiredness  -trouble passing urine or  change in the amount of urine  Side effects that usually do not require medical attention (report to your doctor or health care professional if they continue or are bothersome):  -bone pain  -muscle pain  This list may not describe all possible side effects. Call your doctor for medical advice about side effects. You may report side effects to FDA at 1-800-FDA-1088.  Where should I keep my medicine?  Keep out of the reach of children.  If you are using this medicine at home, you will be instructed on how to store it. Throw away any unused medicine after the expiration date on the label.  NOTE: This sheet is a summary. It may not cover all possible information. If you have questions about this medicine, talk to your doctor, pharmacist, or health care provider.   2019 Elsevier/Gold Standard (2018-01-18 16:57:08)

## 2018-12-30 ENCOUNTER — Other Ambulatory Visit: Payer: Medicaid Other

## 2019-01-03 ENCOUNTER — Inpatient Hospital Stay: Payer: Medicaid Other

## 2019-01-03 DIAGNOSIS — Z5112 Encounter for antineoplastic immunotherapy: Secondary | ICD-10-CM | POA: Diagnosis not present

## 2019-01-03 DIAGNOSIS — Z7189 Other specified counseling: Secondary | ICD-10-CM

## 2019-01-03 DIAGNOSIS — C8198 Hodgkin lymphoma, unspecified, lymph nodes of multiple sites: Secondary | ICD-10-CM

## 2019-01-03 DIAGNOSIS — Z95828 Presence of other vascular implants and grafts: Secondary | ICD-10-CM

## 2019-01-03 LAB — CMP (CANCER CENTER ONLY)
ALT: 45 U/L — ABNORMAL HIGH (ref 0–44)
AST: 27 U/L (ref 15–41)
Albumin: 3.8 g/dL (ref 3.5–5.0)
Alkaline Phosphatase: 150 U/L — ABNORMAL HIGH (ref 38–126)
Anion gap: 19 — ABNORMAL HIGH (ref 5–15)
BUN: 5 mg/dL — ABNORMAL LOW (ref 6–20)
CO2: 19 mmol/L — ABNORMAL LOW (ref 22–32)
Calcium: 8.8 mg/dL — ABNORMAL LOW (ref 8.9–10.3)
Chloride: 97 mmol/L — ABNORMAL LOW (ref 98–111)
Creatinine: 0.93 mg/dL (ref 0.44–1.00)
GFR, Est AFR Am: >60 mL/min (ref >60)
GFR, Estimated: >60 mL/min (ref >60)
Glucose, Bld: 494 mg/dL — ABNORMAL HIGH (ref 70–99)
Potassium: 3.5 mmol/L (ref 3.5–5.1)
Sodium: 135 mmol/L (ref 135–145)
Total Bilirubin: 0.3 mg/dL (ref 0.3–1.2)
Total Protein: 7.5 g/dL (ref 6.5–8.1)

## 2019-01-03 LAB — CBC WITH DIFFERENTIAL/PLATELET
Abs Immature Granulocytes: 0.05 10*3/uL (ref 0.00–0.07)
Basophils Absolute: 0.1 10*3/uL (ref 0.0–0.1)
Basophils Relative: 2 %
EOS PCT: 0 %
Eosinophils Absolute: 0 10*3/uL (ref 0.0–0.5)
HCT: 34.3 % — ABNORMAL LOW (ref 36.0–46.0)
HEMOGLOBIN: 11 g/dL — AB (ref 12.0–15.0)
Immature Granulocytes: 1 %
Lymphocytes Relative: 25 %
Lymphs Abs: 1 10*3/uL (ref 0.7–4.0)
MCH: 30.6 pg (ref 26.0–34.0)
MCHC: 32.1 g/dL (ref 30.0–36.0)
MCV: 95.5 fL (ref 80.0–100.0)
MONOS PCT: 12 %
Monocytes Absolute: 0.5 10*3/uL (ref 0.1–1.0)
Neutro Abs: 2.3 10*3/uL (ref 1.7–7.7)
Neutrophils Relative %: 60 %
Platelets: 161 10*3/uL (ref 150–400)
RBC: 3.59 MIL/uL — ABNORMAL LOW (ref 3.87–5.11)
RDW: 18.8 % — ABNORMAL HIGH (ref 11.5–15.5)
WBC: 3.9 10*3/uL — ABNORMAL LOW (ref 4.0–10.5)
nRBC: 0 % (ref 0.0–0.2)

## 2019-01-03 MED ORDER — SODIUM CHLORIDE 0.9% FLUSH
10.0000 mL | INTRAVENOUS | Status: DC | PRN
  Administered 2019-01-03: 10 mL
  Filled 2019-01-03: qty 10

## 2019-01-03 MED ORDER — HEPARIN SOD (PORK) LOCK FLUSH 100 UNIT/ML IV SOLN
500.0000 [IU] | Freq: Once | INTRAVENOUS | Status: AC | PRN
  Administered 2019-01-03: 500 [IU]
  Filled 2019-01-03: qty 5

## 2019-01-07 ENCOUNTER — Other Ambulatory Visit: Payer: Self-pay

## 2019-01-07 ENCOUNTER — Inpatient Hospital Stay: Payer: Medicaid Other

## 2019-01-07 DIAGNOSIS — Z7189 Other specified counseling: Secondary | ICD-10-CM

## 2019-01-07 DIAGNOSIS — Z5112 Encounter for antineoplastic immunotherapy: Secondary | ICD-10-CM | POA: Diagnosis not present

## 2019-01-07 DIAGNOSIS — Z95828 Presence of other vascular implants and grafts: Secondary | ICD-10-CM

## 2019-01-07 MED ORDER — HEPARIN SOD (PORK) LOCK FLUSH 100 UNIT/ML IV SOLN
500.0000 [IU] | Freq: Once | INTRAVENOUS | Status: AC | PRN
  Administered 2019-01-07: 500 [IU]
  Filled 2019-01-07: qty 5

## 2019-01-07 MED ORDER — SODIUM CHLORIDE 0.9% FLUSH
10.0000 mL | INTRAVENOUS | Status: DC | PRN
  Administered 2019-01-07: 10 mL
  Filled 2019-01-07: qty 10

## 2019-01-07 NOTE — Progress Notes (Signed)
HEMATOLOGY/ONCOLOGY CLINIC NOTE  Date of Service: 01/10/2019  Patient Care Team: Patient, No Pcp Per as PCP - General (General Practice)  CHIEF COMPLAINTS/PURPOSE OF CONSULTATION:  F/u for Mx of Hodgkins lymphoma  HISTORY OF PRESENTING ILLNESS:   Cassandra Clark is a wonderful 38 y.o. female who has been referred to [place] by Dr. 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   Patient has a mild anemia and presented to the emergency room with new onset fevers chills and a 2-week history of worsening night sweats dry cough fatigue and anorexia.  Patient notes that she initially started feeling unwell from late June when she had noted increasing fatigue that required her to rest for longer periods of time.  She also noted that she at times felt lightheaded and dizzy when she bent forward.  She notes a decreased appetite.  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 She notes that she has not been able to do much of anything else other than rest at home and work to some degree at her job.  No chest pain no palpitations no diaphoresis no lower extremity edema.  No abdominal pain no nausea no vomiting no diarrhea.  No rectal bleeding. Notes that her periods have been regular.  In the emergency room the patient was noted to have a fever of 102.4 F and tachycardia related to this.  Blood counts showed that the patient was anemic with a hemoglobin around 8 with normal platelets and a WBC count of 3.6k.  Magnesium 1.9 and phosphorus of 2. She was also noted to be vitamin B12 deficient.  Most recent lab results (08/04/18) of CBC w/diff and CMP is as follows: all values are WNL except for WBC at 3.0k RBC at 3.64, HGB at 8.3, HCT at 82.7, MCV at 78.8, MCH at 22.8, MCHC at 28.9, RDW at 18.1, PLT at 459k, Lymphs abs at 300, Calcium at  8.0, Albumin at 2.7. 08/04/18 LDH at 203 08/04/18 Vitamin B12 at 135  X-ray of the chest done on 08/03/2018 showed Right paratracheal and left AP window masses likely representing lymphadenopathy. Consider CT chest for further characterization.  CT of the chest with contrast was subsequently done which showed 1. 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 In addition there are multiple nodular lesions in the spleen and possibly in the liver, as well as permeative destruction of the sternal manubrium with tumor rind anterior and posterior to the sternum. 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 Left-sided small cell lung cancer might give a similar appearance. Aggressive germ-cell tumor or thymic carcinoma or less likely differential diagnostic considerations. Tissue diagnosis recommended. 2. The anterior mediastinal mass is causing narrowing of the SVC, and mild invasion of the SVC with slight marginal thrombosis is not readily excluded. No large pulmonary embolus is identified.  Patient had additional lab testing which showed a near normal LDH was 203. Sedimentation rate was done subsequently and was noted to be elevated.  Urine pregnancy test was within normal limits.  Patient was noted to have significant headaches over the last 4 to 6 weeks and had a CT of the head without contrast in the ED on 07/04/2018 which showed  no acute intracranial normalities.  On review of systems, pt reports fevers, unquantified weight loss, shortness of breath, night sweats and denies overt chest pain, rashes.   Interval History:   JHADA RISK returns today for management, evaluation and C6D1 of her A-AVD treatment of her Hodgkin's Lymphoma. The patient's last visit with [place] was on 12/27/18. She is accompanied today by her daughter. The pt reports  that she is doing well overall.   The pt reports that she has had good energy levels. She has not had DM in the past. The pt notes that she has gained about 15 pounds since beginning treatment. She has been urinating more frequently and endorses some blurry vision. The pt denies any fevers, chills, concerns for infections, and changes in breathing.  Lab results today (01/10/19) of CBC w/diff and CMP is as follows: all values are WNL except for RBC at 3.60, HGB at 11.0, HCT at 34.5, RDW at 20.3, Abs immature granulocytes at 0.26k, CO2 at 21, Glucose at 476, BUN at 5, ALT at 62, Alk Phos at 152, Total Bilirubin at 0.2. 01/10/19 Magnesium at 1.7  On review of systems, pt reports good energy levels, and denies fevers, chills, difficulty breathing, leg swelling, and any other symptoms.   MEDICAL HISTORY:  Past Medical History:  Diagnosis Date  . Anemia     SURGICAL HISTORY: Past Surgical History:  Procedure Laterality Date  . PICC LINE INSERTION Right 08/18/2018    SOCIAL HISTORY: Social History   Socioeconomic History  . Marital status: Legally Separated    Spouse name: Not on file  . Number of children: Not on file  . Years of education: Not on file  . Highest education level: Not on file  Occupational History  . Not on file  Social Needs  . Financial resource strain: Not on file  . Food insecurity:    Worry: Not on file    Inability: Not on file  . Transportation needs:    Medical: Not on file    Non-medical: Not on file  Tobacco Use  . Smoking status: Never Smoker  . Smokeless tobacco: Never Used  Substance and Sexual Activity  . Alcohol use: No  . Drug use: No    Comment: Drinks tea  . Sexual activity: Yes  Lifestyle  . Physical activity:    Days per week: Not on file    Minutes per session: Not on file  . Stress: Not on file  Relationships  . Social connections:    Talks on phone: Not on file    Gets together: Not on file    Attends religious service: Not on  file    Active member of club or organization: Not on file    Attends meetings of clubs or organizations: Not on file    Relationship status: Not on file  . Intimate partner violence:    Fear of current or ex partner: Not on file    Emotionally abused: Not on file    Physically abused: Not on file    Forced sexual activity: Not on file  Other Topics Concern  . Not on file  Social History Narrative  . Not on file    FAMILY HISTORY: Family History  Problem Relation Age of Onset  . Diabetes Mellitus II Father   . Hypertension Father   . Congestive Heart Failure Maternal Grandmother   . Diabetes Mellitus II Maternal Grandmother   . Diabetes Mellitus II Maternal Uncle   .  Congestive Heart Failure Maternal Uncle     ALLERGIES:  is allergic to penicillins.  MEDICATIONS:  Current Outpatient Medications  Medication Sig Dispense Refill  . acetaminophen (TYLENOL) 325 MG tablet Take 2 tablets (650 mg total) by mouth every 6 (six) hours as needed for mild pain, fever or headache. 30 tablet 1  . albuterol (PROVENTIL) (2.5 MG/3ML) 0.083% nebulizer solution Take 3 mLs (2.5 mg total) by nebulization every 6 (six) hours as needed for wheezing or shortness of breath. 75 mL 12  . B Complex-C (B-COMPLEX WITH VITAMIN C) tablet Take 1 tablet by mouth daily. 30 tablet 3  . Cyanocobalamin (B-12) 1000 MCG SUBL Place 2,000 mcg under the tongue daily. 60 each 3  . dexamethasone (DECADRON) 4 MG tablet Take 2 tablets by mouth once a day on the day after chemotherapy and then take 2 tablets two times a day for 2 days. Take with food. 30 tablet 1  . ferrous sulfate 325 (65 FE) MG tablet Take 1 tablet (325 mg total) by mouth daily. (Patient not taking: Reported on 08/23/2018) 14 tablet 0  . guaiFENesin (MUCINEX) 600 MG 12 hr tablet Take 1 tablet (600 mg total) by mouth 2 (two) times daily. (Patient not taking: Reported on 11/01/2018) 20 tablet o  . HYDROcodone-acetaminophen (NORCO) 5-325 MG tablet Take 1-2  tablets by mouth every 6 (six) hours as needed for moderate pain or severe pain. 30 tablet 0  . lidocaine-prilocaine (EMLA) cream Apply to affected area once 30 g 3  . loratadine (CLARITIN) 10 MG tablet Take 10 mg by mouth daily.    Marland Kitchen LORazepam (ATIVAN) 0.5 MG tablet Take 1 tablet (0.5 mg total) by mouth every 6 (six) hours as needed (Nausea or vomiting). 30 tablet 0  . nystatin (MYCOSTATIN) 100000 UNIT/ML suspension Use as directed 5 mLs (500,000 Units total) in the mouth or throat 4 (four) times daily. (Patient not taking: Reported on 11/01/2018) 60 mL 0  . ondansetron (ZOFRAN) 8 MG tablet Take 1 tablet (8 mg total) by mouth 2 (two) times daily as needed. Start on the third day after chemotherapy. 30 tablet 1  . prochlorperazine (COMPAZINE) 10 MG tablet Take 1 tablet (10 mg total) by mouth every 6 (six) hours as needed (Nausea or vomiting). 30 tablet 1  . rivaroxaban (XARELTO) 10 MG TABS tablet Take 1 tablet (10 mg total) by mouth daily. 30 tablet 1   Current Facility-Administered Medications  Medication Dose Route Frequency Provider Last Rate Last Dose  . insulin regular (NOVOLIN R,HUMULIN R) 100 units/mL injection 4 Units  4 Units Subcutaneous Once Brunetta Genera, MD       Facility-Administered Medications Ordered in Other Visits  Medication Dose Route Frequency Provider Last Rate Last Dose  . brentuximab vedotin (ADCETRIS) 85 mg in sodium chloride 0.9 % 100 mL chemo infusion  1.2 mg/kg (Treatment Plan Recorded) Intravenous Once Brunetta Genera, MD      . dacarbazine (DTIC) 690 mg in sodium chloride 0.9 % 250 mL chemo infusion  375 mg/m2 (Treatment Plan Recorded) Intravenous Once Brunetta Genera, MD      . DOXOrubicin (ADRIAMYCIN) chemo injection 46 mg  25 mg/m2 (Treatment Plan Recorded) Intravenous Once Brunetta Genera, MD      . fosaprepitant (EMEND) 150 mg, dexamethasone (DECADRON) 12 mg in sodium chloride 0.9 % 145 mL IVPB   Intravenous Once Brunetta Genera, MD       . heparin lock flush 100 unit/mL  500 Units Intracatheter Once  PRN Brunetta Genera, MD      . sodium chloride flush (NS) 0.9 % injection 10 mL  10 mL Intracatheter PRN Brunetta Genera, MD      . vinBLAStine (VELBAN) 11 mg in sodium chloride 0.9 % 50 mL chemo infusion  6 mg/m2 (Treatment Plan Recorded) Intravenous Once Brunetta Genera, MD        REVIEW OF SYSTEMS:    A 10+ POINT REVIEW OF SYSTEMS WAS OBTAINED including neurology, dermatology, psychiatry, cardiac, respiratory, lymph, extremities, GI, GU, Musculoskeletal, constitutional, breasts, reproductive, HEENT.  All pertinent positives are noted in the HPI.  All others are negative.   PHYSICAL EXAMINATION:  ECOG PERFORMANCE STATUS: 1 - Symptomatic but completely ambulatory  There were no vitals filed for this visit. There were no vitals filed for this visit. .There is no height or weight on file to calculate BMI.  GENERAL:alert, in no acute distress and comfortable SKIN: no acute rashes, no significant lesions EYES: conjunctiva are pink and non-injected, sclera anicteric OROPHARYNX: MMM, no exudates, no oropharyngeal erythema or ulceration NECK: supple, no JVD LYMPH:  no palpable lymphadenopathy in the cervical, axillary or inguinal regions LUNGS: clear to auscultation b/l with normal respiratory effort HEART: regular rate & rhythm ABDOMEN:  normoactive bowel sounds , non tender, not distended. No palpable hepatosplenomegaly.  Extremity: no pedal edema PSYCH: alert & oriented x 3 with fluent speech NEURO: no focal motor/sensory deficits   LABORATORY DATA:  I have reviewed the data as listed  CBC Latest Ref Rng & Units 01/10/2019 01/03/2019 12/27/2018  WBC 4.0 - 10.5 K/uL 6.0 3.9(L) 12.8(H)  Hemoglobin 12.0 - 15.0 g/dL 11.0(L) 11.0(L) 10.8(L)  Hematocrit 36.0 - 46.0 % 34.5(L) 34.3(L) 34.7(L)  Platelets 150 - 400 K/uL 253 161 239    . CMP Latest Ref Rng & Units 01/10/2019 01/03/2019 12/27/2018  Glucose 70 - 99 mg/dL  476(H) 494(H) 273(H)  BUN 6 - 20 mg/dL 5(L) 5(L) 10  Creatinine 0.44 - 1.00 mg/dL 0.88 0.93 0.80  Sodium 135 - 145 mmol/L 136 135 138  Potassium 3.5 - 5.1 mmol/L 4.0 3.5 3.9  Chloride 98 - 111 mmol/L 101 97(L) 102  CO2 22 - 32 mmol/L 21(L) 19(L) 23  Calcium 8.9 - 10.3 mg/dL 8.9 8.8(L) 9.0  Total Protein 6.5 - 8.1 g/dL 7.3 7.5 7.2  Total Bilirubin 0.3 - 1.2 mg/dL 0.2(L) 0.3 <0.2(L)  Alkaline Phos 38 - 126 U/L 152(H) 150(H) 142(H)  AST 15 - 41 U/L 29 27 21   ALT 0 - 44 U/L 62(H) 45(H) 45(H)    08/04/18 Left Cervical Core Biopsy:   08/06/18 BM Bx:    RADIOGRAPHIC STUDIES: I have personally reviewed the radiological images as listed and agreed with the findings in the report. No results found.  ASSESSMENT & PLAN:   38 y.o. 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 LDH - no significantly elevated. 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 With cytopenias concern for BM involvement with Hodgkins lymphoma  08/05/18 ECHO revealed LV EF of 65-70%. No pericardialeffusion.   08/06/18 BM Bx revealed involvement by Hodgkin's lymphoma  08/13/18 PET/CT revealed Large hypermetabolic mass within the anterior mediastinum identified compatible with lymphoma, Deauville criteria 5. There is extensive hypermetabolic left supraclavicular, left axillary, left hilar and right iliac  adenopathy, Deauville criteria 4 and 5. Additional lesions are identified within the caudate lobe of liver and spleen, Deauville criteria 4. Multifocal hypermetabolic bone lesions are also identified, Deauville criteria 5   Pt began C1 AAVD on 08/09/18  -Delayed C3D15 for pneumonia admission on 10/16/18 to 10/19/18   2.  Liver and splenic lesions concerning for involvement withCHL  3. S/p SVC compression due to anterior mediastinal mass - no upper extremity , neck or face swelling or change in visiion to suggest overt SVC syndrome  4. Microcytic anemia - normal iron. Likely anemia or chronic disease  5. B12 deficiency due to Pernicious anemia  PLAN:  -Discussed pt labwork today, 01/10/19; Glucose at 476, other blood counts and chemistries are stable. Magnesium normal at 1.7. -Insulin today -Decrease to 5m Decadron from 150m-New onset DM vs treatment related hyperglycemia- needed insulin coverage today. -Referring pt to [facility] to establish care with PCP -The pt has no prohibitive toxicities from continuing C6Frontenact this time. -Will complete PET/CT 3-4 weeks after completion of C6 to evaluate treatment efficacy and for further treatment planning including possible RT -Salt and baking mouthwashes 4-5 times a day -Continue Vitamin B12 replacement200023mSL daily -Hydrocodone for as needed use with Udenycha related bone pains   -Will see the pt back in 2 weeks   -[facility] for PCP appointment for uncontrolled hyperglycemia. -f/u with VanLucianne Lei symptom management in 3-4 days for re-evaluation of hyperglycemia. -f/u as per currently scheduled appointments for treatment/labs and MD visit   All of the patients questions were answered with apparent satisfaction. The patient knows to call the clinic with any problems, questions or concerns.  The total time spent in the appt was 25 minutes and more than 50% was on counseling and direct patient cares.    GauSullivan Lone MS AAHIVMS SCHEast Central Regional Hospital - GracewoodHNorthlake Behavioral Health Systemmatology/Oncology Physician ConRochester Psychiatric CenterOffice):       [phone] cell):  [phone]):           [phone]/16/2020 11:20 AM  I, SchBaldwin Jamaicam acting as a scribe for Dr. GauSullivan Lone .I have reviewed the above  documentation for accuracy and completeness, and I agree with the above. .GaBrunetta Genera

## 2019-01-10 ENCOUNTER — Other Ambulatory Visit: Payer: Self-pay

## 2019-01-10 ENCOUNTER — Telehealth: Payer: Self-pay | Admitting: Hematology

## 2019-01-10 ENCOUNTER — Inpatient Hospital Stay: Payer: Medicaid Other

## 2019-01-10 ENCOUNTER — Inpatient Hospital Stay (HOSPITAL_BASED_OUTPATIENT_CLINIC_OR_DEPARTMENT_OTHER): Payer: Medicaid Other | Admitting: Hematology

## 2019-01-10 VITALS — BP 109/83 | HR 101 | Temp 98.1°F | Resp 16 | Wt 172.5 lb

## 2019-01-10 DIAGNOSIS — C8190 Hodgkin lymphoma, unspecified, unspecified site: Secondary | ICD-10-CM

## 2019-01-10 DIAGNOSIS — I871 Compression of vein: Secondary | ICD-10-CM

## 2019-01-10 DIAGNOSIS — C8198 Hodgkin lymphoma, unspecified, lymph nodes of multiple sites: Secondary | ICD-10-CM | POA: Diagnosis not present

## 2019-01-10 DIAGNOSIS — Z5112 Encounter for antineoplastic immunotherapy: Secondary | ICD-10-CM | POA: Diagnosis not present

## 2019-01-10 DIAGNOSIS — R739 Hyperglycemia, unspecified: Secondary | ICD-10-CM

## 2019-01-10 DIAGNOSIS — D510 Vitamin B12 deficiency anemia due to intrinsic factor deficiency: Secondary | ICD-10-CM | POA: Diagnosis not present

## 2019-01-10 DIAGNOSIS — Z7189 Other specified counseling: Secondary | ICD-10-CM

## 2019-01-10 DIAGNOSIS — Z5111 Encounter for antineoplastic chemotherapy: Secondary | ICD-10-CM

## 2019-01-10 DIAGNOSIS — Z95828 Presence of other vascular implants and grafts: Secondary | ICD-10-CM

## 2019-01-10 LAB — CMP (CANCER CENTER ONLY)
ALT: 62 U/L — ABNORMAL HIGH (ref 0–44)
AST: 29 U/L (ref 15–41)
Albumin: 3.6 g/dL (ref 3.5–5.0)
Alkaline Phosphatase: 152 U/L — ABNORMAL HIGH (ref 38–126)
Anion gap: 14 (ref 5–15)
BUN: 5 mg/dL — ABNORMAL LOW (ref 6–20)
CO2: 21 mmol/L — ABNORMAL LOW (ref 22–32)
Calcium: 8.9 mg/dL (ref 8.9–10.3)
Chloride: 101 mmol/L (ref 98–111)
Creatinine: 0.88 mg/dL (ref 0.44–1.00)
Glucose, Bld: 476 mg/dL — ABNORMAL HIGH (ref 70–99)
Potassium: 4 mmol/L (ref 3.5–5.1)
Sodium: 136 mmol/L (ref 135–145)
TOTAL PROTEIN: 7.3 g/dL (ref 6.5–8.1)
Total Bilirubin: 0.2 mg/dL — ABNORMAL LOW (ref 0.3–1.2)

## 2019-01-10 LAB — GLUCOSE, CAPILLARY
GLUCOSE-CAPILLARY: 368 mg/dL — AB (ref 70–99)
Glucose-Capillary: 421 mg/dL — ABNORMAL HIGH (ref 70–99)
Glucose-Capillary: 290 mg/dL — ABNORMAL HIGH (ref 70–99)

## 2019-01-10 LAB — CBC WITH DIFFERENTIAL/PLATELET
Abs Immature Granulocytes: 0.26 10*3/uL — ABNORMAL HIGH (ref 0.00–0.07)
Basophils Absolute: 0.1 10*3/uL (ref 0.0–0.1)
Basophils Relative: 1 %
Eosinophils Absolute: 0 10*3/uL (ref 0.0–0.5)
Eosinophils Relative: 1 %
HCT: 34.5 % — ABNORMAL LOW (ref 36.0–46.0)
Hemoglobin: 11 g/dL — ABNORMAL LOW (ref 12.0–15.0)
IMMATURE GRANULOCYTES: 4 %
Lymphocytes Relative: 16 %
Lymphs Abs: 0.9 10*3/uL (ref 0.7–4.0)
MCH: 30.6 pg (ref 26.0–34.0)
MCHC: 31.9 g/dL (ref 30.0–36.0)
MCV: 95.8 fL (ref 80.0–100.0)
Monocytes Absolute: 0.6 10*3/uL (ref 0.1–1.0)
Monocytes Relative: 10 %
NEUTROS PCT: 68 %
Neutro Abs: 4.1 10*3/uL (ref 1.7–7.7)
PLATELETS: 253 10*3/uL (ref 150–400)
RBC: 3.6 MIL/uL — ABNORMAL LOW (ref 3.87–5.11)
RDW: 20.3 % — ABNORMAL HIGH (ref 11.5–15.5)
WBC: 6 10*3/uL (ref 4.0–10.5)
nRBC: 0 % (ref 0.0–0.2)

## 2019-01-10 LAB — MAGNESIUM: Magnesium: 1.7 mg/dL (ref 1.7–2.4)

## 2019-01-10 MED ORDER — SODIUM CHLORIDE 0.9 % IV SOLN
1.2000 mg/kg | Freq: Once | INTRAVENOUS | Status: AC
  Administered 2019-01-10: 85 mg via INTRAVENOUS
  Filled 2019-01-10: qty 17

## 2019-01-10 MED ORDER — SODIUM CHLORIDE 0.9% FLUSH
10.0000 mL | INTRAVENOUS | Status: DC | PRN
  Filled 2019-01-10: qty 10

## 2019-01-10 MED ORDER — DIPHENHYDRAMINE HCL 25 MG PO CAPS
ORAL_CAPSULE | ORAL | Status: AC
  Filled 2019-01-10: qty 1

## 2019-01-10 MED ORDER — ACETAMINOPHEN 325 MG PO TABS
650.0000 mg | ORAL_TABLET | Freq: Once | ORAL | Status: AC
  Administered 2019-01-10: 650 mg via ORAL

## 2019-01-10 MED ORDER — INSULIN REGULAR HUMAN 100 UNIT/ML IJ SOLN
6.0000 [IU] | Freq: Once | INTRAMUSCULAR | Status: AC
  Administered 2019-01-10: 6 [IU] via SUBCUTANEOUS
  Filled 2019-01-10: qty 10

## 2019-01-10 MED ORDER — VINBLASTINE SULFATE CHEMO INJECTION 1 MG/ML
6.0000 mg/m2 | Freq: Once | INTRAVENOUS | Status: AC
  Administered 2019-01-10: 11 mg via INTRAVENOUS
  Filled 2019-01-10: qty 11

## 2019-01-10 MED ORDER — SODIUM CHLORIDE 0.9 % IV SOLN
Freq: Once | INTRAVENOUS | Status: AC
  Administered 2019-01-10: 11:00:00 via INTRAVENOUS
  Filled 2019-01-10: qty 250

## 2019-01-10 MED ORDER — DOXORUBICIN HCL CHEMO IV INJECTION 2 MG/ML
25.0000 mg/m2 | Freq: Once | INTRAVENOUS | Status: AC
  Administered 2019-01-10: 46 mg via INTRAVENOUS
  Filled 2019-01-10: qty 23

## 2019-01-10 MED ORDER — DIPHENHYDRAMINE HCL 25 MG PO TABS
25.0000 mg | ORAL_TABLET | Freq: Once | ORAL | Status: AC
  Administered 2019-01-10: 25 mg via ORAL
  Filled 2019-01-10: qty 1

## 2019-01-10 MED ORDER — PALONOSETRON HCL INJECTION 0.25 MG/5ML
0.2500 mg | Freq: Once | INTRAVENOUS | Status: AC
  Administered 2019-01-10: 0.25 mg via INTRAVENOUS

## 2019-01-10 MED ORDER — INSULIN REGULAR HUMAN 100 UNIT/ML IJ SOLN
4.0000 [IU] | Freq: Once | INTRAMUSCULAR | Status: AC
  Administered 2019-01-10: 4 [IU] via SUBCUTANEOUS
  Filled 2019-01-10: qty 10

## 2019-01-10 MED ORDER — SODIUM CHLORIDE 0.9 % IV SOLN
Freq: Once | INTRAVENOUS | Status: AC
  Administered 2019-01-10: 12:00:00 via INTRAVENOUS
  Filled 2019-01-10: qty 5

## 2019-01-10 MED ORDER — ACETAMINOPHEN 325 MG PO TABS
ORAL_TABLET | ORAL | Status: AC
  Filled 2019-01-10: qty 2

## 2019-01-10 MED ORDER — HEPARIN SOD (PORK) LOCK FLUSH 100 UNIT/ML IV SOLN
500.0000 [IU] | Freq: Once | INTRAVENOUS | Status: AC | PRN
  Administered 2019-01-10: 500 [IU]
  Filled 2019-01-10: qty 5

## 2019-01-10 MED ORDER — SODIUM CHLORIDE 0.9 % IV SOLN
Freq: Once | INTRAVENOUS | Status: DC
  Filled 2019-01-10: qty 5

## 2019-01-10 MED ORDER — RIVAROXABAN 10 MG PO TABS: 10.0000 mg | ORAL_TABLET | Freq: Every day | ORAL | 1 refills | Status: AC

## 2019-01-10 MED ORDER — SODIUM CHLORIDE 0.9% FLUSH
10.0000 mL | INTRAVENOUS | Status: DC | PRN
  Administered 2019-01-10: 10 mL
  Filled 2019-01-10: qty 10

## 2019-01-10 MED ORDER — HEPARIN SOD (PORK) LOCK FLUSH 100 UNIT/ML IV SOLN
500.0000 [IU] | Freq: Once | INTRAVENOUS | Status: DC | PRN
  Filled 2019-01-10: qty 5

## 2019-01-10 MED ORDER — SODIUM CHLORIDE 0.9 % IV SOLN
375.0000 mg/m2 | Freq: Once | INTRAVENOUS | Status: AC
  Administered 2019-01-10: 690 mg via INTRAVENOUS
  Filled 2019-01-10: qty 69

## 2019-01-10 MED ORDER — PALONOSETRON HCL INJECTION 0.25 MG/5ML
INTRAVENOUS | Status: AC
  Filled 2019-01-10: qty 5

## 2019-01-10 MED FILL — XARELTO 10 MG TABLET: 10 | 30 days supply | Qty: 30 | Fill #0

## 2019-01-10 NOTE — Progress Notes (Signed)
CBG re-checked in tx area @1250  ... 368 Dr Irene Limbo notified 6 units regular insulin ordered.    CBG re-checked @1453  per Dr Irene Limbo... 290. Dr Grier Mitts desk RN Carlyon Prows notified. No new orders.

## 2019-01-10 NOTE — Telephone Encounter (Signed)
Scheduled appt per 3/16 los.   f/u with Lucianne Lei in symptom management in 3-4 days for re-evaluation of hyperglycemia.  Patient aware of appt date and time.

## 2019-01-10 NOTE — Patient Instructions (Addendum)
[facility] Discharge Instructions for Patients Receiving Chemotherapy  Today you received the following chemotherapy agents Adriamycin, Velban, DTIC, Adcentris  To help prevent nausea and vomiting after your treatment, we encourage you to take your nausea medication as directed. No Zofran for 3 days. Take Compazine instead.    If you develop nausea and vomiting that is not controlled by your nausea medication, call the clinic.   BELOW ARE SYMPTOMS THAT SHOULD BE REPORTED IMMEDIATELY:  *FEVER GREATER THAN 100.5 F  *CHILLS WITH OR WITHOUT FEVER  NAUSEA AND VOMITING THAT IS NOT CONTROLLED WITH YOUR NAUSEA MEDICATION  *UNUSUAL SHORTNESS OF BREATH  *UNUSUAL BRUISING OR BLEEDING  TENDERNESS IN MOUTH AND THROAT WITH OR WITHOUT PRESENCE OF ULCERS  *URINARY PROBLEMS  *BOWEL PROBLEMS  UNUSUAL RASH Items with * indicate a potential emergency and should be followed up as soon as possible.  Feel free to call the clinic should you have any questions or concerns. The clinic phone number is [phone].  Please show the CHEMO ALERT CARD at check-in to the Emergency Department and triage nurse.   Hyperglycemia Hyperglycemia is when the sugar (glucose) level in your blood is too high. It may not cause symptoms. If you do have symptoms, they may include warning signs, such as:  Feeling more thirsty than normal.  Hunger.  Feeling tired.  Needing to pee (urinate) more than normal.  Blurry eyesight (vision). You may get other symptoms as it gets worse, such as:  Dry mouth.  Not being hungry (loss of appetite).  Fruity-smelling breath.  Weakness.  Weight gain or loss that is not planned. Weight loss may be fast.  A tingling or numb feeling in your hands or feet.  Headache.  Skin that does not bounce back quickly when it is lightly pinched and released (poor skin turgor).  Pain in your belly (abdomen).  Cuts or bruises that heal slowly. High blood sugar  can happen to people who do or do not have diabetes. High blood sugar can happen slowly or quickly, and it can be an emergency. Follow these instructions at home: General instructions  Take over-the-counter and prescription medicines only as told by your doctor.  Do not use products that contain nicotine or tobacco, such as cigarettes and e-cigarettes. If you need help quitting, ask your doctor.  Limit alcohol intake to no more than 1 drink per day for nonpregnant women and 2 drinks per day for men. One drink equals 12 oz of beer, 5 oz of wine, or 1 oz of hard liquor.  Manage stress. If you need help with this, ask your doctor.  Keep all follow-up visits as told by your doctor. This is important. Eating and drinking   Stay at a healthy weight.  Exercise regularly, as told by your doctor.  Drink enough fluid, especially when you: ? Exercise. ? Get sick. ? Are in hot temperatures.  Eat healthy foods, such as: ? Low-fat (lean) proteins. ? Complex carbs (complex carbohydrates), such as whole wheat bread or brown rice. ? Fresh fruits and vegetables. ? Low-fat dairy products. ? Healthy fats.  Drink enough fluid to keep your pee (urine) clear or pale yellow. If you have diabetes:   Make sure you know the symptoms of hyperglycemia.  Follow your diabetes management plan, as told by your doctor. Make sure you: ? Take insulin and medicines as told. ? Follow your exercise plan. ? Follow your meal plan. Eat on time. Do not skip meals. ? Check   your blood sugar as often as told. Make sure to check before and after exercise. If you exercise longer or in a different way than you normally do, check your blood sugar more often. ? Follow your sick day plan whenever you cannot eat or drink normally. Make this plan ahead of time with your doctor.  Share your diabetes management plan with people in your workplace, school, and household.  Check your urine for ketones when you are ill and as  told by your doctor.  Carry a card or wear jewelry that says that you have diabetes. Contact a doctor if:  Your blood sugar level is higher than 240 mg/dL (13.3 mmol/L) for 2 days in a row.  You have problems keeping your blood sugar in your target range.  High blood sugar happens often for you. Get help right away if:  You have trouble breathing.  You have a change in how you think, feel, or act (mental status).  You feel sick to your stomach (nauseous), and that feeling does not go away.  You cannot stop throwing up (vomiting). These symptoms may be an emergency. Do not wait to see if the symptoms will go away. Get medical help right away. Call your local emergency services (911 in the [place] Do not drive yourself to the hospital. Summary  Hyperglycemia is when the sugar (glucose) level in your blood is too high.  High blood sugar can happen to people who do or do not have diabetes.  Make sure you drink enough fluids, eat healthy foods, and exercise regularly.  Contact your doctor if you have problems keeping your blood sugar in your target range. This information is not intended to replace advice given to you by your health care provider. Make sure you discuss any questions you have with your health care provider. Document Released: 08/10/2009 Document Revised: 06/30/2016 Document Reviewed: 06/30/2016 Elsevier Interactive Patient Education  2019 Elsevier Inc[employer]    Hyperglycemia Hyperglycemia is when the sugar (glucose) level in your blood is too high. It may not cause symptoms. If you do have symptoms, they may include warning signs, such as:  Feeling more thirsty than normal.  Hunger.  Feeling tired.  Needing to pee (urinate) more than normal.  Blurry eyesight (vision). You may get other symptoms as it gets worse, such as:  Dry mouth.  Not being hungry (loss of appetite).  Fruity-smelling breath.  Weakness.  Weight gain or loss that is not planned. Weight  loss may be fast.  A tingling or numb feeling in your hands or feet.  Headache.  Skin that does not bounce back quickly when it is lightly pinched and released (poor skin turgor).  Pain in your belly (abdomen).  Cuts or bruises that heal slowly. High blood sugar can happen to people who do or do not have diabetes. High blood sugar can happen slowly or quickly, and it can be an emergency. Follow these instructions at home: General instructions  Take over-the-counter and prescription medicines only as told by your doctor.  Do not use products that contain nicotine or tobacco, such as cigarettes and e-cigarettes. If you need help quitting, ask your doctor.  Limit alcohol intake to no more than 1 drink per day for nonpregnant women and 2 drinks per day for men. One drink equals 12 oz of beer, 5 oz of wine, or 1 oz of hard liquor.  Manage stress. If you need help with this, ask your doctor.  Keep all follow-up visits as   told by your doctor. This is important. Eating and drinking   Stay at a healthy weight.  Exercise regularly, as told by your doctor.  Drink enough fluid, especially when you: ? Exercise. ? Get sick. ? Are in hot temperatures.  Eat healthy foods, such as: ? Low-fat (lean) proteins. ? Complex carbs (complex carbohydrates), such as whole wheat bread or brown rice. ? Fresh fruits and vegetables. ? Low-fat dairy products. ? Healthy fats.  Drink enough fluid to keep your pee (urine) clear or pale yellow. If you have diabetes:   Make sure you know the symptoms of hyperglycemia.  Follow your diabetes management plan, as told by your doctor. Make sure you: ? Take insulin and medicines as told. ? Follow your exercise plan. ? Follow your meal plan. Eat on time. Do not skip meals. ? Check your blood sugar as often as told. Make sure to check before and after exercise. If you exercise longer or in a different way than you normally do, check your blood sugar more  often. ? Follow your sick day plan whenever you cannot eat or drink normally. Make this plan ahead of time with your doctor.  Share your diabetes management plan with people in your workplace, school, and household.  Check your urine for ketones when you are ill and as told by your doctor.  Carry a card or wear jewelry that says that you have diabetes. Contact a doctor if:  Your blood sugar level is higher than 240 mg/dL (13.3 mmol/L) for 2 days in a row.  You have problems keeping your blood sugar in your target range.  High blood sugar happens often for you. Get help right away if:  You have trouble breathing.  You have a change in how you think, feel, or act (mental status).  You feel sick to your stomach (nauseous), and that feeling does not go away.  You cannot stop throwing up (vomiting). These symptoms may be an emergency. Do not wait to see if the symptoms will go away. Get medical help right away. Call your local emergency services (911 in the [place] Do not drive yourself to the hospital. Summary  Hyperglycemia is when the sugar (glucose) level in your blood is too high.  High blood sugar can happen to people who do or do not have diabetes.  Make sure you drink enough fluids, eat healthy foods, and exercise regularly.  Contact your doctor if you have problems keeping your blood sugar in your target range. This information is not intended to replace advice given to you by your health care provider. Make sure you discuss any questions you have with your health care provider. Document Released: 08/10/2009 Document Revised: 06/30/2016 Document Reviewed: 06/30/2016 Elsevier Interactive Patient Education  2019 Elsevier Inc.  

## 2019-01-11 ENCOUNTER — Other Ambulatory Visit: Payer: Self-pay

## 2019-01-11 ENCOUNTER — Inpatient Hospital Stay: Payer: Medicaid Other

## 2019-01-11 VITALS — BP 136/74 | HR 89 | Temp 97.8°F | Resp 18

## 2019-01-11 DIAGNOSIS — I871 Compression of vein: Secondary | ICD-10-CM

## 2019-01-11 DIAGNOSIS — Z5112 Encounter for antineoplastic immunotherapy: Secondary | ICD-10-CM | POA: Diagnosis not present

## 2019-01-11 DIAGNOSIS — C8198 Hodgkin lymphoma, unspecified, lymph nodes of multiple sites: Secondary | ICD-10-CM

## 2019-01-11 DIAGNOSIS — Z7189 Other specified counseling: Secondary | ICD-10-CM

## 2019-01-11 MED ORDER — PEGFILGRASTIM INJECTION 6 MG/0.6ML ~~LOC~~
PREFILLED_SYRINGE | SUBCUTANEOUS | Status: AC
  Filled 2019-01-11: qty 0.6

## 2019-01-11 MED ORDER — PEGFILGRASTIM-CBQV 6 MG/0.6ML ~~LOC~~ SOSY
PREFILLED_SYRINGE | SUBCUTANEOUS | Status: AC
  Filled 2019-01-11: qty 0.6

## 2019-01-11 MED ORDER — PEGFILGRASTIM INJECTION 6 MG/0.6ML ~~LOC~~
6.0000 mg | PREFILLED_SYRINGE | Freq: Once | SUBCUTANEOUS | Status: AC
  Administered 2019-01-11: 6 mg via SUBCUTANEOUS

## 2019-01-13 ENCOUNTER — Other Ambulatory Visit: Payer: Self-pay | Admitting: Medical

## 2019-01-13 DIAGNOSIS — R739 Hyperglycemia, unspecified: Secondary | ICD-10-CM

## 2019-01-14 ENCOUNTER — Inpatient Hospital Stay: Payer: Medicaid Other

## 2019-01-14 ENCOUNTER — Other Ambulatory Visit: Payer: Medicaid Other

## 2019-01-14 ENCOUNTER — Inpatient Hospital Stay: Payer: Medicaid Other | Admitting: Medical

## 2019-01-17 ENCOUNTER — Inpatient Hospital Stay: Payer: Medicaid Other

## 2019-01-20 NOTE — Progress Notes (Signed)
HEMATOLOGY/ONCOLOGY CLINIC NOTE  Date of Service: 01/24/2019  Patient Care Team: Patient, No Pcp Per as PCP - General (General Practice)  CHIEF COMPLAINTS/PURPOSE OF CONSULTATION:  F/u for Mx of Hodgkins lymphoma  HISTORY OF PRESENTING ILLNESS:   Cassandra Clark is a wonderful 38 y.o. female who has been referred to [place] by Dr. 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   Patient has a mild anemia and presented to the emergency room with new onset fevers chills and a 2-week history of worsening night sweats dry cough fatigue and anorexia.  Patient notes that she initially started feeling unwell from late June when she had noted increasing fatigue that required her to rest for longer periods of time.  She also noted that she at times felt lightheaded and dizzy when she bent forward.  She notes a decreased appetite.  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 She notes that she has not been able to do much of anything else other than rest at home and work to some degree at her job.  No chest pain no palpitations no diaphoresis no lower extremity edema.  No abdominal pain no nausea no vomiting no diarrhea.  No rectal bleeding. Notes that her periods have been regular.  In the emergency room the patient was noted to have a fever of 102.4 F and tachycardia related to this.  Blood counts showed that the patient was anemic with a hemoglobin around 8 with normal platelets and a WBC count of 3.6k.  Magnesium 1.9 and phosphorus of 2. She was also noted to be vitamin B12 deficient.  Most recent lab results (08/04/18) of CBC w/diff and CMP is as follows: all values are WNL except for WBC at 3.0k RBC at 3.64, HGB at 8.3, HCT at 82.7, MCV at 78.8, MCH at 22.8, MCHC at 28.9, RDW at 18.1, PLT at 459k, Lymphs abs at 300, Calcium at  8.0, Albumin at 2.7. 08/04/18 LDH at 203 08/04/18 Vitamin B12 at 135  X-ray of the chest done on 08/03/2018 showed Right paratracheal and left AP window masses likely representing lymphadenopathy. Consider CT chest for further characterization.  CT of the chest with contrast was subsequently done which showed 1. 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 In addition there are multiple nodular lesions in the spleen and possibly in the liver, as well as permeative destruction of the sternal manubrium with tumor rind anterior and posterior to the sternum. 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 Left-sided small cell lung cancer might give a similar appearance. Aggressive germ-cell tumor or thymic carcinoma or less likely differential diagnostic considerations. Tissue diagnosis recommended. 2. The anterior mediastinal mass is causing narrowing of the SVC, and mild invasion of the SVC with slight marginal thrombosis is not readily excluded. No large pulmonary embolus is identified.  Patient had additional lab testing which showed a near normal LDH was 203. Sedimentation rate was done subsequently and was noted to be elevated.  Urine pregnancy test was within normal limits.  Patient was noted to have significant headaches over the last 4 to 6 weeks and had a CT of the head without contrast in the ED on 07/04/2018 which showed  no acute intracranial normalities.  On review of systems, pt reports fevers, unquantified weight loss, shortness of breath, night sweats and denies overt chest pain, rashes.   Interval History:   [name] returns today for management, evaluation and C6D15 of her A-AVD treatment of her Hodgkin's Lymphoma. The patient's last visit with [place] was on 01/10/19. The pt reports that she is doing well overall.   The  pt reports that her fasting glucose has been around 266 in the morning, down to 178 after she was told to increase to 24 units insulin. The pt is not taking any other medications. She notes that her blurry vision has improved since beginning insulin. The pt denies developing any other concerns in the interim. The pt notes that her weight has increased about 20 pounds throughout treatment.  The pt denies any concerns for infections at this time.  Lab results today (01/24/19) of CBC is as follows: all values are WNL except for RBC at 3.44, HGB at 10.4, HCT at 33.8, RDW at 20.2.  On review of systems, pt reports stable energy levels, eating well, weight gain, moving her bowels well, and denies mouth sores, concerns for infections, problems with picc line, abdominal pains, difficulty urinating, and any other symptoms.   MEDICAL HISTORY:  Past Medical History:  Diagnosis Date  . Anemia     SURGICAL HISTORY: Past Surgical History:  Procedure Laterality Date  . PICC LINE INSERTION Right 08/18/2018    SOCIAL HISTORY: Social History   Socioeconomic History  . Marital status: Legally Separated    Spouse name: Not on file  . Number of children: Not on file  . Years of education: Not on file  . Highest education level: Not on file  Occupational History  . Not on file  Social Needs  . Financial resource strain: Not on file  . Food insecurity:    Worry: Not on file    Inability: Not on file  . Transportation needs:    Medical: Not on file    Non-medical: Not on file  Tobacco Use  . Smoking status: Never Smoker  . Smokeless tobacco: Never Used  Substance and Sexual Activity  . Alcohol use: No  . Drug use: No    Comment: Drinks tea  . Sexual activity: Yes  Lifestyle  . Physical activity:    Days per week: Not on file    Minutes per session: Not on file  . Stress: Not on file  Relationships  . Social connections:    Talks on phone: Not on file    Gets together: Not on file     Attends religious service: Not on file    Active member of club or organization: Not on file    Attends meetings of clubs or organizations: Not on file    Relationship status: Not on file  . Intimate partner violence:    Fear of current or ex partner: Not on file    Emotionally abused: Not on file    Physically abused: Not on file    Forced sexual activity: Not on file  Other Topics Concern  . Not on file  Social History Narrative  . Not on file    FAMILY HISTORY: Family History  Problem Relation Age of Onset  . Diabetes Mellitus II Father   . Hypertension Father   . Congestive Heart Failure Maternal Grandmother   . Diabetes Mellitus II Maternal Grandmother   . Diabetes Mellitus II Maternal Uncle   .  Congestive Heart Failure Maternal Uncle     ALLERGIES:  is allergic to penicillins.  MEDICATIONS:  Current Outpatient Medications  Medication Sig Dispense Refill  . acetaminophen (TYLENOL) 325 MG tablet Take 2 tablets (650 mg total) by mouth every 6 (six) hours as needed for mild pain, fever or headache. 30 tablet 1  . albuterol (PROVENTIL) (2.5 MG/3ML) 0.083% nebulizer solution Take 3 mLs (2.5 mg total) by nebulization every 6 (six) hours as needed for wheezing or shortness of breath. 75 mL 12  . B Complex-C (B-COMPLEX WITH VITAMIN C) tablet Take 1 tablet by mouth daily. 30 tablet 3  . Cyanocobalamin (B-12) 1000 MCG SUBL Place 2,000 mcg under the tongue daily. 60 each 3  . dexamethasone (DECADRON) 4 MG tablet Take 2 tablets by mouth once a day on the day after chemotherapy and then take 2 tablets two times a day for 2 days. Take with food. 30 tablet 1  . ferrous sulfate 325 (65 FE) MG tablet Take 1 tablet (325 mg total) by mouth daily. (Patient not taking: Reported on 08/23/2018) 14 tablet 0  . guaiFENesin (MUCINEX) 600 MG 12 hr tablet Take 1 tablet (600 mg total) by mouth 2 (two) times daily. (Patient not taking: Reported on 11/01/2018) 20 tablet o  . HYDROcodone-acetaminophen  (NORCO) 5-325 MG tablet Take 1-2 tablets by mouth every 6 (six) hours as needed for moderate pain or severe pain. 30 tablet 0  . lidocaine-prilocaine (EMLA) cream Apply to affected area once 30 g 3  . loratadine (CLARITIN) 10 MG tablet Take 10 mg by mouth daily.    Marland Kitchen LORazepam (ATIVAN) 0.5 MG tablet Take 1 tablet (0.5 mg total) by mouth every 6 (six) hours as needed (Nausea or vomiting). 30 tablet 0  . nystatin (MYCOSTATIN) 100000 UNIT/ML suspension Use as directed 5 mLs (500,000 Units total) in the mouth or throat 4 (four) times daily. (Patient not taking: Reported on 11/01/2018) 60 mL 0  . ondansetron (ZOFRAN) 8 MG tablet Take 1 tablet (8 mg total) by mouth 2 (two) times daily as needed. Start on the third day after chemotherapy. 30 tablet 1  . prochlorperazine (COMPAZINE) 10 MG tablet Take 1 tablet (10 mg total) by mouth every 6 (six) hours as needed (Nausea or vomiting). 30 tablet 1  . rivaroxaban (XARELTO) 10 MG TABS tablet Take 1 tablet (10 mg total) by mouth daily. 30 tablet 1   No current facility-administered medications for this visit.     REVIEW OF SYSTEMS:    A 10+ POINT REVIEW OF SYSTEMS WAS OBTAINED including neurology, dermatology, psychiatry, cardiac, respiratory, lymph, extremities, GI, GU, Musculoskeletal, constitutional, breasts, reproductive, HEENT.  All pertinent positives are noted in the HPI.  All others are negative.   PHYSICAL EXAMINATION:  ECOG PERFORMANCE STATUS: 1 - Symptomatic but completely ambulatory  Vitals:   01/24/19 0951  BP: 122/75  Pulse: 98  Resp: 18  Temp: 97.8 F (36.6 C)  SpO2: 100%   Filed Weights   01/24/19 0951  Weight: 171 lb 9.6 oz (77.8 kg)   .Body mass index is 26.88 kg/m.  GENERAL:alert, in no acute distress and comfortable SKIN: no acute rashes, no significant lesions EYES: conjunctiva are pink and non-injected, sclera anicteric OROPHARYNX: MMM, no exudates, no oropharyngeal erythema or ulceration NECK: supple, no JVD LYMPH:  no  palpable lymphadenopathy in the cervical, axillary or inguinal regions LUNGS: clear to auscultation b/l with normal respiratory effort HEART: regular rate & rhythm ABDOMEN:  normoactive bowel sounds , non  tender, not distended. No palpable hepatosplenomegaly.  Extremity: no pedal edema PSYCH: alert & oriented x 3 with fluent speech NEURO: no focal motor/sensory deficits   LABORATORY DATA:  I have reviewed the data as listed  CBC Latest Ref Rng & Units 01/24/2019 01/10/2019 01/03/2019  WBC 4.0 - 10.5 K/uL 9.6 6.0 3.9(L)  Hemoglobin 12.0 - 15.0 g/dL 10.4(L) 11.0(L) 11.0(L)  Hematocrit 36.0 - 46.0 % 33.8(L) 34.5(L) 34.3(L)  Platelets 150 - 400 K/uL 358 253 161    . CMP Latest Ref Rng & Units 01/10/2019 01/03/2019 12/27/2018  Glucose 70 - 99 mg/dL 476(H) 494(H) 273(H)  BUN 6 - 20 mg/dL 5(L) 5(L) 10  Creatinine 0.44 - 1.00 mg/dL 0.88 0.93 0.80  Sodium 135 - 145 mmol/L 136 135 138  Potassium 3.5 - 5.1 mmol/L 4.0 3.5 3.9  Chloride 98 - 111 mmol/L 101 97(L) 102  CO2 22 - 32 mmol/L 21(L) 19(L) 23  Calcium 8.9 - 10.3 mg/dL 8.9 8.8(L) 9.0  Total Protein 6.5 - 8.1 g/dL 7.3 7.5 7.2  Total Bilirubin 0.3 - 1.2 mg/dL 0.2(L) 0.3 <0.2(L)  Alkaline Phos 38 - 126 U/L 152(H) 150(H) 142(H)  AST 15 - 41 U/L 29 27 21   ALT 0 - 44 U/L 62(H) 45(H) 45(H)    08/04/18 Left Cervical Core Biopsy:   08/06/18 BM Bx:    RADIOGRAPHIC STUDIES: I have personally reviewed the radiological images as listed and agreed with the findings in the report. No results found.  ASSESSMENT & PLAN:   38 y.o. 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 LDH - no significantly elevated. 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 With  cytopenias concern for BM involvement with Hodgkins lymphoma  08/05/18 ECHO revealed LV EF of 65-70%. No pericardialeffusion.   08/06/18 BM Bx revealed involvement by Hodgkin's lymphoma  08/13/18 PET/CT revealed Large hypermetabolic mass within the anterior mediastinum identified compatible with lymphoma, Deauville criteria 5. There is extensive hypermetabolic left supraclavicular, left axillary, left hilar and right iliac adenopathy, Deauville criteria 4 and 5. Additional lesions are identified within the caudate lobe of liver and spleen, Deauville criteria 4. Multifocal hypermetabolic bone lesions are also identified, Deauville criteria 5   Pt began C1 AAVD on 08/09/18  -Delayed C3D15 for pneumonia admission on 10/16/18 to 10/19/18   2. Liver and splenic lesions concerning for involvement withCHL  3. S/p SVC compression due to anterior mediastinal mass - no upper extremity , neck or face swelling or change in visiion to suggest overt SVC syndrome  4. Microcytic anemia - normal iron. Likely anemia or chronic disease  5. B12 deficiency due to Pernicious anemia  PLAN:  -Discussed pt labwork today, 01/24/19; blood counts are stable -The pt has no prohibitive toxicities from continuing C6D15 A-AVD treatment, at this time. -Decreased to 8mg  Decadron from 12mg  -Advised taking a 10-15 minute walk after meals, and increasing physical activity in general. -Expect insulin requirements to decrease as pt is completing treatment which has involved steroids -New onset DM vs treatment related hyperglycemia- following with PCP with insulin. -Will complete PET/CT in 5 weeks to evaluate treatment efficacy and for further treatment planning including possible RT -Salt and baking mouthwashes 4-5 times a day -Continue Vitamin B12 replacement2062mcg SL daily -Hydrocodone for as needed use with Udenycha related bone  pains -No live virus vaccines for 2 years -Will order picc line removal after  treatment completion today -Will see the pt back in 6 weeks   PET/CT in 5 weeks RTC with Dr Irene Limbo with labs in 6 weeks   All of the patients questions were answered with apparent satisfaction. The patient knows to call the clinic with any problems, questions or concerns.  The total time spent in the appt was 25 minutes and more than 50% was on counseling and direct patient cares.    Sullivan Lone MD MS AAHIVMS [facility] Hematology/Oncology Physician [facility]  (Office):       [phone] (Work cell):  [phone] (Fax):           [phone]  01/24/2019 10:25 AM  I, Baldwin [place], am acting as a scribe for Dr. Sullivan Lone.   .I have reviewed the above documentation for accuracy and completeness, and I agree with the above. Brunetta Genera MD

## 2019-01-21 ENCOUNTER — Inpatient Hospital Stay: Payer: Medicaid Other

## 2019-01-21 ENCOUNTER — Other Ambulatory Visit: Payer: Self-pay

## 2019-01-21 DIAGNOSIS — Z5112 Encounter for antineoplastic immunotherapy: Secondary | ICD-10-CM | POA: Diagnosis not present

## 2019-01-21 DIAGNOSIS — Z7189 Other specified counseling: Secondary | ICD-10-CM

## 2019-01-21 DIAGNOSIS — I871 Compression of vein: Secondary | ICD-10-CM

## 2019-01-21 DIAGNOSIS — C8198 Hodgkin lymphoma, unspecified, lymph nodes of multiple sites: Secondary | ICD-10-CM

## 2019-01-21 DIAGNOSIS — Z95828 Presence of other vascular implants and grafts: Secondary | ICD-10-CM

## 2019-01-21 MED ORDER — SODIUM CHLORIDE 0.9% FLUSH
10.0000 mL | INTRAVENOUS | Status: DC | PRN
  Administered 2019-01-21: 10 mL
  Filled 2019-01-21: qty 10

## 2019-01-21 MED ORDER — HEPARIN SOD (PORK) LOCK FLUSH 100 UNIT/ML IV SOLN
250.0000 [IU] | Freq: Once | INTRAVENOUS | Status: AC | PRN
  Administered 2019-01-21: 250 [IU]
  Filled 2019-01-21: qty 5

## 2019-01-24 ENCOUNTER — Inpatient Hospital Stay: Payer: Medicaid Other

## 2019-01-24 ENCOUNTER — Other Ambulatory Visit: Payer: Self-pay

## 2019-01-24 ENCOUNTER — Inpatient Hospital Stay (HOSPITAL_BASED_OUTPATIENT_CLINIC_OR_DEPARTMENT_OTHER): Payer: Medicaid Other | Admitting: Hematology

## 2019-01-24 ENCOUNTER — Telehealth: Payer: Self-pay | Admitting: Hematology

## 2019-01-24 VITALS — BP 122/75 | HR 98 | Temp 97.8°F | Resp 18 | Ht 67.0 in | Wt 171.6 lb

## 2019-01-24 DIAGNOSIS — D510 Vitamin B12 deficiency anemia due to intrinsic factor deficiency: Secondary | ICD-10-CM

## 2019-01-24 DIAGNOSIS — C8190 Hodgkin lymphoma, unspecified, unspecified site: Secondary | ICD-10-CM

## 2019-01-24 DIAGNOSIS — C8198 Hodgkin lymphoma, unspecified, lymph nodes of multiple sites: Secondary | ICD-10-CM

## 2019-01-24 DIAGNOSIS — I871 Compression of vein: Secondary | ICD-10-CM

## 2019-01-24 DIAGNOSIS — D649 Anemia, unspecified: Secondary | ICD-10-CM

## 2019-01-24 DIAGNOSIS — Z7189 Other specified counseling: Secondary | ICD-10-CM

## 2019-01-24 DIAGNOSIS — Z95828 Presence of other vascular implants and grafts: Secondary | ICD-10-CM

## 2019-01-24 DIAGNOSIS — Z5111 Encounter for antineoplastic chemotherapy: Secondary | ICD-10-CM

## 2019-01-24 DIAGNOSIS — Z5112 Encounter for antineoplastic immunotherapy: Secondary | ICD-10-CM | POA: Diagnosis not present

## 2019-01-24 LAB — CMP (CANCER CENTER ONLY)
ALT: 109 U/L — ABNORMAL HIGH (ref 0–44)
AST: 36 U/L (ref 15–41)
Albumin: 3.6 g/dL (ref 3.5–5.0)
Alkaline Phosphatase: 131 U/L — ABNORMAL HIGH (ref 38–126)
Anion gap: 12 (ref 5–15)
BUN: 9 mg/dL (ref 6–20)
CO2: 24 mmol/L (ref 22–32)
Calcium: 8.5 mg/dL — ABNORMAL LOW (ref 8.9–10.3)
Chloride: 104 mmol/L (ref 98–111)
Creatinine: 0.73 mg/dL (ref 0.44–1.00)
GFR, Est AFR Am: >60 mL/min (ref >60)
GFR, Estimated: >60 mL/min (ref >60)
Glucose, Bld: 257 mg/dL — ABNORMAL HIGH (ref 70–99)
POTASSIUM: 3.6 mmol/L (ref 3.5–5.1)
Sodium: 140 mmol/L (ref 135–145)
Total Bilirubin: 0.2 mg/dL — ABNORMAL LOW (ref 0.3–1.2)
Total Protein: 6.7 g/dL (ref 6.5–8.1)

## 2019-01-24 LAB — CBC WITH DIFFERENTIAL/PLATELET
Abs Immature Granulocytes: 0.74 10*3/uL — ABNORMAL HIGH (ref 0.00–0.07)
BASOS PCT: 0 %
Basophils Absolute: 0 10*3/uL (ref 0.0–0.1)
Eosinophils Absolute: 0 10*3/uL (ref 0.0–0.5)
Eosinophils Relative: 0 %
HCT: 33.8 % — ABNORMAL LOW (ref 36.0–46.0)
Hemoglobin: 10.4 g/dL — ABNORMAL LOW (ref 12.0–15.0)
Immature Granulocytes: 8 %
Lymphocytes Relative: 14 %
Lymphs Abs: 1.3 10*3/uL (ref 0.7–4.0)
MCH: 30.2 pg (ref 26.0–34.0)
MCHC: 30.8 g/dL (ref 30.0–36.0)
MCV: 98.3 fL (ref 80.0–100.0)
MONOS PCT: 10 %
Monocytes Absolute: 0.9 10*3/uL (ref 0.1–1.0)
Neutro Abs: 6.6 10*3/uL (ref 1.7–7.7)
Neutrophils Relative %: 68 %
Platelets: 358 10*3/uL (ref 150–400)
RBC: 3.44 MIL/uL — ABNORMAL LOW (ref 3.87–5.11)
RDW: 20.2 % — AB (ref 11.5–15.5)
WBC: 9.6 10*3/uL (ref 4.0–10.5)
nRBC: 0 % (ref 0.0–0.2)

## 2019-01-24 LAB — MAGNESIUM: Magnesium: 1.7 mg/dL (ref 1.7–2.4)

## 2019-01-24 MED ORDER — DIPHENHYDRAMINE HCL 25 MG PO TABS
25.0000 mg | ORAL_TABLET | Freq: Once | ORAL | Status: AC
  Administered 2019-01-24: 25 mg via ORAL
  Filled 2019-01-24: qty 1

## 2019-01-24 MED ORDER — SODIUM CHLORIDE 0.9 % IV SOLN
Freq: Once | INTRAVENOUS | Status: AC
  Administered 2019-01-24: 12:00:00 via INTRAVENOUS
  Filled 2019-01-24: qty 5

## 2019-01-24 MED ORDER — SODIUM CHLORIDE 0.9% FLUSH
10.0000 mL | INTRAVENOUS | Status: DC | PRN
  Administered 2019-01-24: 10 mL
  Filled 2019-01-24: qty 10

## 2019-01-24 MED ORDER — PALONOSETRON HCL INJECTION 0.25 MG/5ML
0.2500 mg | Freq: Once | INTRAVENOUS | Status: AC
  Administered 2019-01-24: 0.25 mg via INTRAVENOUS

## 2019-01-24 MED ORDER — ACETAMINOPHEN 325 MG PO TABS
650.0000 mg | ORAL_TABLET | Freq: Once | ORAL | Status: AC
  Administered 2019-01-24: 650 mg via ORAL

## 2019-01-24 MED ORDER — INSULIN REGULAR HUMAN 100 UNIT/ML IJ SOLN
4.0000 [IU] | Freq: Once | INTRAMUSCULAR | Status: AC
  Administered 2019-01-24: 4 [IU] via SUBCUTANEOUS
  Filled 2019-01-24: qty 10

## 2019-01-24 MED ORDER — SODIUM CHLORIDE 0.9 % IV SOLN
1.2000 mg/kg | Freq: Once | INTRAVENOUS | Status: AC
  Administered 2019-01-24: 85 mg via INTRAVENOUS
  Filled 2019-01-24: qty 17

## 2019-01-24 MED ORDER — SODIUM CHLORIDE 0.9 % IV SOLN
Freq: Once | INTRAVENOUS | Status: DC
  Filled 2019-01-24: qty 250

## 2019-01-24 MED ORDER — DIPHENHYDRAMINE HCL 25 MG PO CAPS
ORAL_CAPSULE | ORAL | Status: AC
  Filled 2019-01-24: qty 1

## 2019-01-24 MED ORDER — SODIUM CHLORIDE 0.9 % IV SOLN
Freq: Once | INTRAVENOUS | Status: DC
  Filled 2019-01-24: qty 5

## 2019-01-24 MED ORDER — ACETAMINOPHEN 325 MG PO TABS
ORAL_TABLET | ORAL | Status: AC
  Filled 2019-01-24: qty 2

## 2019-01-24 MED ORDER — INSULIN ASPART 100 UNIT/ML ~~LOC~~ SOLN
4.0000 [IU] | Freq: Once | SUBCUTANEOUS | Status: DC
  Filled 2019-01-24: qty 0.04

## 2019-01-24 MED ORDER — DOXORUBICIN HCL CHEMO IV INJECTION 2 MG/ML
25.0000 mg/m2 | Freq: Once | INTRAVENOUS | Status: AC
  Administered 2019-01-24: 46 mg via INTRAVENOUS
  Filled 2019-01-24: qty 23

## 2019-01-24 MED ORDER — PALONOSETRON HCL INJECTION 0.25 MG/5ML
INTRAVENOUS | Status: AC
  Filled 2019-01-24: qty 5

## 2019-01-24 MED ORDER — VINBLASTINE SULFATE CHEMO INJECTION 1 MG/ML
6.0000 mg/m2 | Freq: Once | INTRAVENOUS | Status: AC
  Administered 2019-01-24: 11 mg via INTRAVENOUS
  Filled 2019-01-24: qty 11

## 2019-01-24 MED ORDER — SODIUM CHLORIDE 0.9 % IV SOLN
375.0000 mg/m2 | Freq: Once | INTRAVENOUS | Status: AC
  Administered 2019-01-24: 690 mg via INTRAVENOUS
  Filled 2019-01-24: qty 69

## 2019-01-24 NOTE — Patient Instructions (Signed)
[facility]charge Instructions for Patients Receiving Chemotherapy  Today you received the following chemotherapy agents Adriamycin, Velban, DTIC, Adcentris  To help prevent nausea and vomiting after your treatment, we encourage you to take your nausea medication as directed. No Zofran for 3 days. Take Compazine instead.    If you develop nausea and vomiting that is not controlled by your nausea medication, call the clinic.   BELOW ARE SYMPTOMS THAT SHOULD BE REPORTED IMMEDIATELY:  *FEVER GREATER THAN 100.5 F  *CHILLS WITH OR WITHOUT FEVER  NAUSEA AND VOMITING THAT IS NOT CONTROLLED WITH YOUR NAUSEA MEDICATION  *UNUSUAL SHORTNESS OF BREATH  *UNUSUAL BRUISING OR BLEEDING  TENDERNESS IN MOUTH AND THROAT WITH OR WITHOUT PRESENCE OF ULCERS  *URINARY PROBLEMS  *BOWEL PROBLEMS  UNUSUAL RASH Items with * indicate a potential emergency and should be followed up as soon as possible.  Feel free to call the clinic should you have any questions or concerns. The clinic phone number is [phone].  Please show the [facility] at check-in to the Emergency Department and triage nurse.   Hyperglycemia Hyperglycemia is when the sugar (glucose) level in your blood is too high. It may not cause symptoms. If you do have symptoms, they may include warning signs, such as:  Feeling more thirsty than normal.  Hunger.  Feeling tired.  Needing to pee (urinate) more than normal.  Blurry eyesight (vision). You may get other symptoms as it gets worse, such as:  Dry mouth.  Not being hungry (loss of appetite).  Fruity-smelling breath.  Weakness.  Weight gain or loss that is not planned. Weight loss may be fast.  A tingling or numb feeling in your hands or feet.  Headache.  Skin that does not bounce back quickly when it is lightly pinched and released (poor skin turgor).  Pain in your belly (abdomen).  Cuts or bruises that heal slowly. High blood sugar  can happen to people who do or do not have diabetes. High blood sugar can happen slowly or quickly, and it can be an emergency. Follow these instructions at home: General instructions  Take over-the-counter and prescription medicines only as told by your doctor.  Do not use products that contain nicotine or tobacco, such as cigarettes and e-cigarettes. If you need help quitting, ask your doctor.  Limit alcohol intake to no more than 1 drink per day for nonpregnant women and 2 drinks per day for men. One drink equals 12 oz of beer, 5 oz of wine, or 1 oz of hard liquor.  Manage stress. If you need help with this, ask your doctor.  Keep all follow-up visits as told by your doctor. This is important. Eating and drinking   Stay at a healthy weight.  Exercise regularly, as told by your doctor.  Drink enough fluid, especially when you: ? Exercise. ? Get sick. ? Are in hot temperatures.  Eat healthy foods, such as: ? Low-fat (lean) proteins. ? Complex carbs (complex carbohydrates), such as whole wheat bread or brown rice. ? Fresh fruits and vegetables. ? Low-fat dairy products. ? Healthy fats.  Drink enough fluid to keep your pee (urine) clear or pale yellow. If you have diabetes:   Make sure you know the symptoms of hyperglycemia.  Follow your diabetes management plan, as told by your doctor. Make sure you: ? Take insulin and medicines as told. ? Follow your exercise plan. ? Follow your meal plan. Eat on time. Do not skip meals. ? Check  your blood sugar as often as told. Make sure to check before and after exercise. If you exercise longer or in a different way than you normally do, check your blood sugar more often. ? Follow your sick day plan whenever you cannot eat or drink normally. Make this plan ahead of time with your doctor.  Share your diabetes management plan with people in your workplace, school, and household.  Check your urine for ketones when you are ill and as  told by your doctor.  Carry a card or wear jewelry that says that you have diabetes. Contact a doctor if:  Your blood sugar level is higher than 240 mg/dL (13.3 mmol/L) for 2 days in a row.  You have problems keeping your blood sugar in your target range.  High blood sugar happens often for you. Get help right away if:  You have trouble breathing.  You have a change in how you think, feel, or act (mental status).  You feel sick to your stomach (nauseous), and that feeling does not go away.  You cannot stop throwing up (vomiting). These symptoms may be an emergency. Do not wait to see if the symptoms will go away. Get medical help right away. Call your local emergency services (911 in the [place] Do not drive yourself to the hospital. Summary  Hyperglycemia is when the sugar (glucose) level in your blood is too high.  High blood sugar can happen to people who do or do not have diabetes.  Make sure you drink enough fluids, eat healthy foods, and exercise regularly.  Contact your doctor if you have problems keeping your blood sugar in your target range. This information is not intended to replace advice given to you by your health care provider. Make sure you discuss any questions you have with your health care provider. Document Released: 08/10/2009 Document Revised: 06/30/2016 Document Reviewed: 06/30/2016 Elsevier Interactive Patient Education  2019 [employer].    Hyperglycemia Hyperglycemia is when the sugar (glucose) level in your blood is too high. It may not cause symptoms. If you do have symptoms, they may include warning signs, such as:  Feeling more thirsty than normal.  Hunger.  Feeling tired.  Needing to pee (urinate) more than normal.  Blurry eyesight (vision). You may get other symptoms as it gets worse, such as:  Dry mouth.  Not being hungry (loss of appetite).  Fruity-smelling breath.  Weakness.  Weight gain or loss that is not planned. Weight  loss may be fast.  A tingling or numb feeling in your hands or feet.  Headache.  Skin that does not bounce back quickly when it is lightly pinched and released (poor skin turgor).  Pain in your belly (abdomen).  Cuts or bruises that heal slowly. High blood sugar can happen to people who do or do not have diabetes. High blood sugar can happen slowly or quickly, and it can be an emergency. Follow these instructions at home: General instructions  Take over-the-counter and prescription medicines only as told by your doctor.  Do not use products that contain nicotine or tobacco, such as cigarettes and e-cigarettes. If you need help quitting, ask your doctor.  Limit alcohol intake to no more than 1 drink per day for nonpregnant women and 2 drinks per day for men. One drink equals 12 oz of beer, 5 oz of wine, or 1 oz of hard liquor.  Manage stress. If you need help with this, ask your doctor.  Keep all follow-up visits as  told by your doctor. This is important. Eating and drinking   Stay at a healthy weight.  Exercise regularly, as told by your doctor.  Drink enough fluid, especially when you: ? Exercise. ? Get sick. ? Are in hot temperatures.  Eat healthy foods, such as: ? Low-fat (lean) proteins. ? Complex carbs (complex carbohydrates), such as whole wheat bread or brown rice. ? Fresh fruits and vegetables. ? Low-fat dairy products. ? Healthy fats.  Drink enough fluid to keep your pee (urine) clear or pale yellow. If you have diabetes:   Make sure you know the symptoms of hyperglycemia.  Follow your diabetes management plan, as told by your doctor. Make sure you: ? Take insulin and medicines as told. ? Follow your exercise plan. ? Follow your meal plan. Eat on time. Do not skip meals. ? Check your blood sugar as often as told. Make sure to check before and after exercise. If you exercise longer or in a different way than you normally do, check your blood sugar more  often. ? Follow your sick day plan whenever you cannot eat or drink normally. Make this plan ahead of time with your doctor.  Share your diabetes management plan with people in your workplace, school, and household.  Check your urine for ketones when you are ill and as told by your doctor.  Carry a card or wear jewelry that says that you have diabetes. Contact a doctor if:  Your blood sugar level is higher than 240 mg/dL (13.3 mmol/L) for 2 days in a row.  You have problems keeping your blood sugar in your target range.  High blood sugar happens often for you. Get help right away if:  You have trouble breathing.  You have a change in how you think, feel, or act (mental status).  You feel sick to your stomach (nauseous), and that feeling does not go away.  You cannot stop throwing up (vomiting). These symptoms may be an emergency. Do not wait to see if the symptoms will go away. Get medical help right away. Call your local emergency services (911 in the [place] Do not drive yourself to the hospital. Summary  Hyperglycemia is when the sugar (glucose) level in your blood is too high.  High blood sugar can happen to people who do or do not have diabetes.  Make sure you drink enough fluids, eat healthy foods, and exercise regularly.  Contact your doctor if you have problems keeping your blood sugar in your target range. This information is not intended to replace advice given to you by your health care provider. Make sure you discuss any questions you have with your health care provider. Document Released: 08/10/2009 Document Revised: 06/30/2016 Document Reviewed: 06/30/2016 Elsevier Interactive Patient Education  2019 [employer].

## 2019-01-24 NOTE — Progress Notes (Signed)
Okay to treat today with ALT 109. Per Dr. Irene Limbo. New orders for 4units novolog for glucose of 259.

## 2019-01-24 NOTE — Telephone Encounter (Signed)
Scheduled appt per 3/30 los.  [facility] will contact patient about the PET scan.

## 2019-01-25 ENCOUNTER — Inpatient Hospital Stay: Payer: Medicaid Other

## 2019-01-26 ENCOUNTER — Other Ambulatory Visit: Payer: Self-pay

## 2019-01-26 ENCOUNTER — Other Ambulatory Visit: Payer: Self-pay | Admitting: Hematology

## 2019-01-26 ENCOUNTER — Inpatient Hospital Stay: Payer: Medicaid Other

## 2019-01-26 ENCOUNTER — Inpatient Hospital Stay: Payer: Medicaid Other | Attending: Hematology

## 2019-01-26 VITALS — BP 132/76 | HR 96 | Resp 18

## 2019-01-26 DIAGNOSIS — Z7189 Other specified counseling: Secondary | ICD-10-CM

## 2019-01-26 DIAGNOSIS — Z5189 Encounter for other specified aftercare: Secondary | ICD-10-CM | POA: Diagnosis present

## 2019-01-26 DIAGNOSIS — C8198 Hodgkin lymphoma, unspecified, lymph nodes of multiple sites: Secondary | ICD-10-CM

## 2019-01-26 DIAGNOSIS — D649 Anemia, unspecified: Secondary | ICD-10-CM | POA: Diagnosis not present

## 2019-01-26 DIAGNOSIS — I871 Compression of vein: Secondary | ICD-10-CM

## 2019-01-26 MED ORDER — PEGFILGRASTIM INJECTION 6 MG/0.6ML ~~LOC~~
6.0000 mg | PREFILLED_SYRINGE | Freq: Once | SUBCUTANEOUS | Status: AC
  Administered 2019-01-26: 6 mg via SUBCUTANEOUS

## 2019-01-26 MED ORDER — PEGFILGRASTIM INJECTION 6 MG/0.6ML ~~LOC~~
PREFILLED_SYRINGE | SUBCUTANEOUS | Status: AC
  Filled 2019-01-26: qty 0.6

## 2019-01-26 NOTE — Patient Instructions (Signed)
PICC Removal, Adult, Care After This sheet gives you information about how to care for yourself after your procedure. Your health care provider may also give you more specific instructions. If you have problems or questions, contact your health care provider. What can I expect after the procedure? After your procedure, it is common to have:  Tenderness or soreness.  Redness, swelling, or a scab where the PICC was removed (exit site). Follow these instructions at home: For the first 24 hours after the procedure   Keep the bandage (dressing) on the exit site clean and dry. Do not remove the dressing until your health care provider tells you to do so.  Check your arm often for signs and symptoms of an infection. Check for: ? A red streak that spreads away from the dressing. ? Blood or fluid that you can see on the dressing. ? More redness or swelling.  Do not lift anything heavy or do activities that require great effort until your health care provider says it is okay. You should avoid: ? Lifting weights. ? Yard work. ? Any physical activity with repetitive arm movement.  Watch closely for any signs of an air bubble in the vein (air embolism). This is a rare but serious complication. If you have signs of air embolism, call 911 immediately and lie down on your left side to keep the air from moving into the lungs. Signs of an air embolism include: ? Difficulty breathing. ? Chest pain. ? Coughing or wheezing. ? Skin that is pale, blue, cold, or clammy. ? Rapid pulse. ? Rapid breathing. ? Fainting. After 24 Hours have passed:  Remove your dressing as told by your health care provider. Make sure you wash your hands with soap and water before and after you change the dressing. If soap and water are not available, use hand sanitizer.  Return to your normal activities as told by your health care provider.  A small scab may develop over the exit site. Do not pick at the scab.  When  bathing or showering, gently wash the exit site with soap and water. Pat it dry.  Watch for signs of infection, such as: ? Fever or chills. ? Swollen glands under the arm. ? More redness, swelling, or soreness in the arm. ? Blood, fluid, or pus coming from the exit site. ? Warmth or a bad smell at the exit site. ? A red streak spreading away from the exit site. General instructions  Take over-the-counter and prescription medicines only as told by your health care provider. Do not take any new medicines without checking with your health care provider first.  If you were prescribed an antibiotic medicine, apply or take it as told by your health care provider. Do not stop using the antibiotic even if your condition improves.  Keep all follow-up visits as told by your health care provider. This is important. Contact a health care provider if:  You have a fever or chills.  You have soreness, redness, or swelling on your exit site, and it gets worse.  You have swollen glands under your arm.  You have any of the following symptoms at your exit site: ? Blood, fluid, or pus. ? Unusual warmth. ? A bad smell. ? A red streak spreading away from the exit site. Get help right away if:  You have numbness or tingling in your fingers, hand, or arm.  Your arm looks blue and feels cold.  You have signs of an air embolism, such   A bad smell.  ? A red streak spreading away from the exit site.  Get help right away if:  · You have numbness or tingling in your fingers, hand, or arm.  · Your arm looks blue and feels cold.  · You have signs of an air embolism, such as:  ? Difficulty breathing.  ? Chest pain.  ? Coughing or wheezing.  ? Skin that is pale, blue, cold, or clammy.  ? Rapid pulse.  ? Rapid breathing.  ? Fainting.  These symptoms may represent a serious problem that is an emergency. Do not wait to see if the symptoms will go away. Get medical help right away. Call your local emergency services (911 in the [place] Do not drive yourself to the hospital.  Summary  · After your procedure, it is common to have tenderness or soreness, redness, swelling, or a scab at the exit site.  · Keep the dressing over the exit site clean and dry.  Do not remove the dressing until your health care provider tells you to do so.  · Do not lift anything heavy or do activities that require great effort until your health care provider says it is okay.  · Watch closely for any signs of an air embolism. If you have signs of air embolism, call 911 immediately and lie down on your left side.  This information is not intended to replace advice given to you by your health care provider. Make sure you discuss any questions you have with your health care provider.  Document Released: 10/18/2013 Document Revised: 12/09/2016 Document Reviewed: 12/09/2016  Elsevier Interactive Patient Education © 2019 [employer]

## 2019-01-28 ENCOUNTER — Inpatient Hospital Stay: Payer: Medicaid Other

## 2019-02-24 ENCOUNTER — Ambulatory Visit: Payer: Medicaid Other | Admitting: Family Medicine

## 2019-02-25 ENCOUNTER — Encounter: Payer: Self-pay | Admitting: General Practice

## 2019-02-25 NOTE — Progress Notes (Signed)
[facility] Team contacted patient to assess for food insecurity and other psychosocial needs during current COVID19 pandemic.  Has family support, no concerns.   Patient/family expressed no needs at this time.  Support Team member encouraged patient to call if changes occur or they have any other questions/concerns.   Beverely Pace, [place]

## 2019-02-28 ENCOUNTER — Ambulatory Visit (HOSPITAL_COMMUNITY): Payer: Medicaid Other

## 2019-03-01 ENCOUNTER — Other Ambulatory Visit: Payer: Self-pay

## 2019-03-01 ENCOUNTER — Ambulatory Visit (HOSPITAL_COMMUNITY)
Admission: RE | Admit: 2019-03-01 | Discharge: 2019-03-01 | Disposition: A | Discharge status: Home / Self Care | Payer: Medicaid Other | Source: Ambulatory Visit | Attending: Hematology | Admitting: Hematology

## 2019-03-01 DIAGNOSIS — C8190 Hodgkin lymphoma, unspecified, unspecified site: Secondary | ICD-10-CM | POA: Insufficient documentation

## 2019-03-01 LAB — GLUCOSE, CAPILLARY: Glucose-Capillary: 85 mg/dL (ref 70–99)

## 2019-03-01 MED ORDER — FLUDEOXYGLUCOSE F - 18 (FDG) INJECTION
8.5500 | Freq: Once | INTRAVENOUS | Status: AC | PRN
  Administered 2019-03-01: 8.55 via INTRAVENOUS

## 2019-03-04 NOTE — Progress Notes (Signed)
HEMATOLOGY/ONCOLOGY CLINIC NOTE  Date of Service: 03/07/2019  Patient Care Team: Patient, No Pcp Per as PCP - General (General Practice)  CHIEF COMPLAINTS/PURPOSE OF CONSULTATION:  F/u for Mx of Hodgkins lymphoma  HISTORY OF PRESENTING ILLNESS:   Cassandra Clark is a wonderful 38 y.o. female who has been referred to [place] by Dr. 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   Patient has a mild anemia and presented to the emergency room with new onset fevers chills and a 2-week history of worsening night sweats dry cough fatigue and anorexia.  Patient notes that she initially started feeling unwell from late June when she had noted increasing fatigue that required her to rest for longer periods of time.  She also noted that she at times felt lightheaded and dizzy when she bent forward.  She notes a decreased appetite.  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 She notes that she has not been able to do much of anything else other than rest at home and work to some degree at her job.  No chest pain no palpitations no diaphoresis no lower extremity edema.  No abdominal pain no nausea no vomiting no diarrhea.  No rectal bleeding. Notes that her periods have been regular.  In the emergency room the patient was noted to have a fever of 102.4 F and tachycardia related to this.  Blood counts showed that the patient was anemic with a hemoglobin around 8 with normal platelets and a WBC count of 3.6k.  Magnesium 1.9 and phosphorus of 2. She was also noted to be vitamin B12 deficient.  Most recent lab results (08/04/18) of CBC w/diff and CMP is as follows: all values are WNL except for WBC at 3.0k RBC at 3.64, HGB at 8.3, HCT at 82.7, MCV at 78.8, MCH at 22.8, MCHC at 28.9, RDW at 18.1, PLT at 459k, Lymphs abs at 300, Calcium at  8.0, Albumin at 2.7. 08/04/18 LDH at 203 08/04/18 Vitamin B12 at 135  X-ray of the chest done on 08/03/2018 showed Right paratracheal and left AP window masses likely representing lymphadenopathy. Consider CT chest for further characterization.  CT of the chest with contrast was subsequently done which showed 1. 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 In addition there are multiple nodular lesions in the spleen and possibly in the liver, as well as permeative destruction of the sternal manubrium with tumor rind anterior and posterior to the sternum. 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 Left-sided small cell lung cancer might give a similar appearance. Aggressive germ-cell tumor or thymic carcinoma or less likely differential diagnostic considerations. Tissue diagnosis recommended. 2. The anterior mediastinal mass is causing narrowing of the SVC, and mild invasion of the SVC with slight marginal thrombosis is not readily excluded. No large pulmonary embolus is identified.  Patient had additional lab testing which showed a near normal LDH was 203. Sedimentation rate was done subsequently and was noted to be elevated.  Urine pregnancy test was within normal limits.  Patient was noted to have significant headaches over the last 4 to 6 weeks and had a CT of the head without contrast in the ED on 07/04/2018 which showed  no acute intracranial normalities.  On review of systems, pt reports fevers, unquantified weight loss, shortness of breath, night sweats and denies overt chest pain, rashes.   Interval History:   Cassandra Clark returns today for management, evaluation after completing 6 planned cycles of her A-AVD treatment of her Hodgkin's Lymphoma. The patient's last visit with [place] was on 01/24/19. The pt reports that she is doing  well overall.  The pt reports that she has been discussing her DM management further with her PCP and has been able to wean herself off of insulin, and is continuing to check her sugars at home.   The pt notes that she has been breathing well and endorses good energy levels. She denies leg swelling, abdominal pains, or concerns for infections. She notes that she has occasional tingling in her fingertips, but not persistent. The pt notes that she has occasional hot flashes, but her periods have not returned yet.  Of note since the patient's last visit, pt has had a PET/CT completed on 03/01/19 with results revealing "A small focus of Deauville 4 activity along the left margin of the sternal manubrium is noted with maximum SUV of 6.0. This could reflect a small amount of recurrent tumor in this vicinity. The patient also has scattered new hypermetabolic brown fat in the neck and upper chest, and alternative possibility is that this represents a small focus of misregistered hypermetabolic but benign brown fat adjacent to the sternum. Surveillance of this region is recommended. 2. Otherwise stable Deauville 3 activity in the bandlike residual density in the anterior mediastinum, and overall 2 activity in the small residual left axillary lymph nodes. No hypermetabolic activity in the abdomen. 3. Mature left ovarian dermoid. 4. Prior diffuse osseous activity has intervally resolved."  Lab results today (03/07/19) of CBC w/diff is as follows: all values are WNL except for RBC at 3.80, HGB at 11.3, RDW at 17.0. 03/07/19 CMP is pending 03/07/19 Sed Rate is pending 03/07/19 Magnesium is pending  On review of systems, pt reports good energy levels, eating well, breathing well, occasional tingling in fingertips, and denies concerns for infections, abdominal pains, leg swelling, lower abdominal pains, neck swelling, and any other symptoms.   MEDICAL HISTORY:  Past Medical History:  Diagnosis Date   Anemia      SURGICAL HISTORY: Past Surgical History:  Procedure Laterality Date   PICC LINE INSERTION Right 08/18/2018    SOCIAL HISTORY: Social History   Socioeconomic History   Marital status: Legally Separated    Spouse name: Not on file   Number of children: Not on file   Years of education: Not on file   Highest education level: Not on file  Occupational History   Not on file  Social Needs   Financial resource strain: Not on file   Food insecurity:    Worry: Not on file    Inability: Not on file   Transportation needs:    Medical: Not on file    Non-medical: Not on file  Tobacco Use   Smoking status: Never Smoker   Smokeless tobacco: Never Used  Substance and Sexual Activity   Alcohol use: No   Drug use: No    Comment: Drinks tea   Sexual activity: Yes  Lifestyle   Physical activity:    Days per week: Not on file    Minutes per session: Not on file   Stress: Not on file  Relationships   Social connections:    Talks on phone:  Not on file    Gets together: Not on file    Attends religious service: Not on file    Active member of club or organization: Not on file    Attends meetings of clubs or organizations: Not on file    Relationship status: Not on file   Intimate partner violence:    Fear of current or ex partner: Not on file    Emotionally abused: Not on file    Physically abused: Not on file    Forced sexual activity: Not on file  Other Topics Concern   Not on file  Social History Narrative   Not on file    FAMILY HISTORY: Family History  Problem Relation Age of Onset   Diabetes Mellitus II Father    Hypertension Father    Congestive Heart Failure Maternal Grandmother    Diabetes Mellitus II Maternal Grandmother    Diabetes Mellitus II Maternal Uncle    Congestive Heart Failure Maternal Uncle     ALLERGIES:  is allergic to penicillins.  MEDICATIONS:  Current Outpatient Medications  Medication Sig Dispense Refill    acetaminophen (TYLENOL) 325 MG tablet Take 2 tablets (650 mg total) by mouth every 6 (six) hours as needed for mild pain, fever or headache. 30 tablet 1   albuterol (PROVENTIL) (2.5 MG/3ML) 0.083% nebulizer solution Take 3 mLs (2.5 mg total) by nebulization every 6 (six) hours as needed for wheezing or shortness of breath. 75 mL 12   B Complex-C (B-COMPLEX WITH VITAMIN C) tablet Take 1 tablet by mouth daily. 30 tablet 3   Cyanocobalamin (B-12) 1000 MCG SUBL Place 2,000 mcg under the tongue daily. 60 each 3   dexamethasone (DECADRON) 4 MG tablet Take 2 tablets by mouth once a day on the day after chemotherapy and then take 2 tablets two times a day for 2 days. Take with food. 30 tablet 1   ferrous sulfate 325 (65 FE) MG tablet Take 1 tablet (325 mg total) by mouth daily. (Patient not taking: Reported on 08/23/2018) 14 tablet 0   guaiFENesin (MUCINEX) 600 MG 12 hr tablet Take 1 tablet (600 mg total) by mouth 2 (two) times daily. (Patient not taking: Reported on 11/01/2018) 20 tablet o   HYDROcodone-acetaminophen (NORCO) 5-325 MG tablet Take 1-2 tablets by mouth every 6 (six) hours as needed for moderate pain or severe pain. 30 tablet 0   lidocaine-prilocaine (EMLA) cream Apply to affected area once 30 g 3   loratadine (CLARITIN) 10 MG tablet Take 10 mg by mouth daily.     LORazepam (ATIVAN) 0.5 MG tablet Take 1 tablet (0.5 mg total) by mouth every 6 (six) hours as needed (Nausea or vomiting). 30 tablet 0   nystatin (MYCOSTATIN) 100000 UNIT/ML suspension Use as directed 5 mLs (500,000 Units total) in the mouth or throat 4 (four) times daily. (Patient not taking: Reported on 11/01/2018) 60 mL 0   ondansetron (ZOFRAN) 8 MG tablet Take 1 tablet (8 mg total) by mouth 2 (two) times daily as needed. Start on the third day after chemotherapy. 30 tablet 1   prochlorperazine (COMPAZINE) 10 MG tablet Take 1 tablet (10 mg total) by mouth every 6 (six) hours as needed (Nausea or vomiting). 30 tablet 1    rivaroxaban (XARELTO) 10 MG TABS tablet Take 1 tablet (10 mg total) by mouth daily. 30 tablet 1   Current Facility-Administered Medications  Medication Dose Route Frequency Provider Last Rate Last Dose   pneumococcal 13-valent conjugate vaccine (PREVNAR 13) injection  0.5 mL  0.5 mL Intramuscular Once Brunetta Genera, MD        REVIEW OF SYSTEMS:    A 10+ POINT REVIEW OF SYSTEMS WAS OBTAINED including neurology, dermatology, psychiatry, cardiac, respiratory, lymph, extremities, GI, GU, Musculoskeletal, constitutional, breasts, reproductive, HEENT.  All pertinent positives are noted in the HPI.  All others are negative.   PHYSICAL EXAMINATION:  ECOG PERFORMANCE STATUS: 1 - Symptomatic but completely ambulatory  Vitals:   03/07/19 1049  BP: 128/74  Pulse: 98  Resp: 18  Temp: 98.1 F (36.7 C)  SpO2: 97%   Filed Weights   03/07/19 1049  Weight: 167 lb 6.4 oz (75.9 kg)   .Body mass index is 26.22 kg/m.  GENERAL:alert, in no acute distress and comfortable SKIN: no acute rashes, no significant lesions EYES: conjunctiva are pink and non-injected, sclera anicteric OROPHARYNX: MMM, no exudates, no oropharyngeal erythema or ulceration NECK: supple, no JVD LYMPH:  no palpable lymphadenopathy in the cervical, axillary or inguinal regions LUNGS: clear to auscultation b/l with normal respiratory effort HEART: regular rate & rhythm ABDOMEN:  normoactive bowel sounds , non tender, not distended. No palpable hepatosplenomegaly.  Extremity: no pedal edema PSYCH: alert & oriented x 3 with fluent speech NEURO: no focal motor/sensory deficits   LABORATORY DATA:  I have reviewed the data as listed  CBC Latest Ref Rng & Units 03/07/2019 01/24/2019 01/10/2019  WBC 4.0 - 10.5 K/uL 4.1 9.6 6.0  Hemoglobin 12.0 - 15.0 g/dL 11.3(L) 10.4(L) 11.0(L)  Hematocrit 36.0 - 46.0 % 36.9 33.8(L) 34.5(L)  Platelets 150 - 400 K/uL 347 358 253    . CMP Latest Ref Rng & Units 01/24/2019 01/10/2019  01/03/2019  Glucose 70 - 99 mg/dL 257(H) 476(H) 494(H)  BUN 6 - 20 mg/dL 9 5(L) 5(L)  Creatinine 0.44 - 1.00 mg/dL 0.73 0.88 0.93  Sodium 135 - 145 mmol/L 140 136 135  Potassium 3.5 - 5.1 mmol/L 3.6 4.0 3.5  Chloride 98 - 111 mmol/L 104 101 97(L)  CO2 22 - 32 mmol/L 24 21(L) 19(L)  Calcium 8.9 - 10.3 mg/dL 8.5(L) 8.9 8.8(L)  Total Protein 6.5 - 8.1 g/dL 6.7 7.3 7.5  Total Bilirubin 0.3 - 1.2 mg/dL 0.2(L) 0.2(L) 0.3  Alkaline Phos 38 - 126 U/L 131(H) 152(H) 150(H)  AST 15 - 41 U/L 36 29 27  ALT 0 - 44 U/L 109(H) 62(H) 45(H)    08/04/18 Left Cervical Core Biopsy:   08/06/18 BM Bx:    RADIOGRAPHIC STUDIES: I have personally reviewed the radiological images as listed and agreed with the findings in the report. Nm Pet Image Restag (ps) Skull Base To Thigh  Result Date: 03/01/2019 CLINICAL DATA:  Subsequent treatment strategy for Hodgkin's lymphoma. EXAM: NUCLEAR MEDICINE PET SKULL BASE TO THIGH TECHNIQUE: 8 point sick mCi F-18 FDG was injected intravenously. Full-ring PET imaging was performed from the skull base to thigh after the radiotracer. CT data was obtained and used for attenuation correction and anatomic localization. Fasting blood glucose: 85 mg/dl COMPARISON:  Multiple exams, including 11/22/2018 FINDINGS: Mediastinal blood pool activity: SUV max 1.9 Background liver activity: SUV max 3.0 NECK: Hypermetabolic brown fat activity in the suboccipital, paraspinal, and supraclavicular fatty tissues. No pathologically enlarged or hypermetabolic adenopathy in the neck. Incidental CT findings: none CHEST: Bandlike density in the anterior mediastinum corresponding to prior nodal activity, current maximum SUV of this bandlike density is 2.0 (Deauville 3), previously 2.5. Morphologically this bandlike density is similar to 1/20 04/2019 and prominently reduced in volume  from 08/13/2018. As before, the left axillary lymph nodes which were previously enlarged and hypermetabolic on 19/14/7829 remain  small in size, including a 0.7 cm left axillary lymph node on image 56/4 with maximum SUV of 0.9, Deauville 2. No new abnormal hypermetabolic at adenopathy. A small focus of accentuated activity in the left lower para-aortic region with maximum SUV 5.0 is thought to be hypermetabolic brown fat or a small amount of venous radiopharmaceutical, and is not thought to represent a significant lesion. Incidental CT findings: none ABDOMEN/PELVIS: No splenomegaly or focal splenic lesion. No adenopathy or significant hypermetabolic lesion. Incidental CT findings: Mature left ovarian dermoid, unchanged. SKELETON: There is some mixed density in the sternal manubrium, as before, along with a small focus of accentuated metabolic activity along the left margin of the sternal manubrium, maximum SUV 6.0 (Deauville 4). This is of uncertain significance in the context of the otherwise reduced diffuse background bony activity. It is possible that this actually represents brown fat tissue adjacent to the sternum. Incidental CT findings: none IMPRESSION: 1. A small focus of Deauville 4 activity along the left margin of the sternal manubrium is noted with maximum SUV of 6.0. This could reflect a small amount of recurrent tumor in this vicinity. The patient also has scattered new hypermetabolic brown fat in the neck and upper chest, and alternative possibility is that this represents a small focus of misregistered hypermetabolic but benign brown fat adjacent to the sternum. Surveillance of this region is recommended. 2. Otherwise stable Deauville 3 activity in the bandlike residual density in the anterior mediastinum, and overall 2 activity in the small residual left axillary lymph nodes. No hypermetabolic activity in the abdomen. 3. Mature left ovarian dermoid. 4. Prior diffuse osseous activity has intervally resolved. Electronically Signed   By: Van Clines M.D.   On: 03/01/2019 13:25    ASSESSMENT & PLAN:   38 y.o. 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 LDH - no significantly elevated. 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 With cytopenias concern for BM involvement with Hodgkins lymphoma  08/05/18 ECHO revealed LV EF of 65-70%. No pericardialeffusion.   08/06/18 BM Bx revealed involvement by Hodgkin's lymphoma  08/13/18 PET/CT revealed Large hypermetabolic mass within the anterior mediastinum identified compatible with lymphoma, Deauville criteria 5. There is extensive hypermetabolic left supraclavicular, left axillary, left hilar and right iliac adenopathy, Deauville criteria 4 and 5. Additional lesions are identified within the caudate lobe of liver and spleen, Deauville criteria 4. Multifocal hypermetabolic bone lesions are also identified, Deauville criteria 5   Pt began C1 AAVD on 08/09/18  Delayed C3D15 for pneumonia admission on 10/16/18 to 10/19/18  11/22/18 PET/CT revealed "Reduction in volume and no significant metabolic activity within anterior mediastinal nodes and LEFT axillary nodes ( Deauville 1). 2. No focal activity within the liver or spleen ( Deauville 1) 3. No new or progressive adenopathy. 4. Marked increase in marrow activity throughout the axillary and appendicular skeleton consistent with GCSF type response. 5. 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 Liver and splenic lesions concerning for involvement withCHL  3. S/p SVC compression due to anterior mediastinal mass - no upper extremity , neck or face swelling  or change in visiion to suggest overt SVC syndrome  4. Microcytic anemia - normal iron. Likely anemia or chronic  disease  5. B12 deficiency due to Pernicious anemia  PLAN:  -Discussed pt labwork today, 03/07/19; blood counts stable -03/07/19 Sed rate is pending -Discussed the 03/01/19 PET/CT which revealed "A small focus of Deauville 4 activity along the left margin of the sternal manubrium is noted with maximum SUV of 6.0. This could reflect a small amount of recurrent tumor in this vicinity. The patient also has scattered new hypermetabolic brown fat in the neck and upper chest, and alternative possibility is that this represents a small focus of misregistered hypermetabolic but benign brown fat adjacent to the sternum. Surveillance of this region is recommended. 2. Otherwise stable Deauville 3 activity in the bandlike residual density in the anterior mediastinum, and overall 2 activity in the small residual left axillary lymph nodes. No hypermetabolic activity in the abdomen. 3. Mature left ovarian dermoid. 4. Prior diffuse osseous activity has intervally resolved." -Discussed that from my perspective, the pt has obtained complete response to treatment -Discussed that I would recommend repeating a PET/CT scan in 3 months for interval study given the above findings -Recommend pt have discussion with Rad Onc in the setting of having had a bulky initial tumor for consideration of RT vs watchful observation -Recommend returning to care with OBGYN for management of ovarian dermoid  -Recommended staying up to date with annual flu vaccine and the every 5 year pneumonia vaccines Prevnar and Pneumovax -Will give Prevnar vaccine in clinic today, 03/07/19 -No live virus vaccines for 2 years -Continue Vitamin B12 replacement2037mcg SL daily -Will see the pt back in 3 months   Prevnar injection today PET/CT in 10 weeks Referral to radiation oncology to consider role of ISRT for mediastinal bulky disease RTC with Dr Irene Limbo with labs and port flush appointment in 12 weeks Pneumovax inj in 12weeks with upcoming clinic  visit   All of the patients questions were answered with apparent satisfaction. The patient knows to call the clinic with any problems, questions or concerns.  The total time spent in the appt was 30 minutes and more than 50% was on counseling and direct patient cares.    Sullivan Lone MD MS AAHIVMS [facility] Hematology/Oncology Physician [facility]  (Office):       [phone] (Work cell):  [phone] (Fax):           [phone]  03/07/2019 11:41 AM  I, Baldwin [place], am acting as a scribe for Dr. Sullivan Lone.   .I have reviewed the above documentation for accuracy and completeness, and I agree with the above. Brunetta Genera MD

## 2019-03-07 ENCOUNTER — Inpatient Hospital Stay: Payer: Medicaid Other

## 2019-03-07 ENCOUNTER — Inpatient Hospital Stay: Payer: Medicaid Other | Attending: Hematology | Admitting: Hematology

## 2019-03-07 ENCOUNTER — Other Ambulatory Visit: Payer: Self-pay

## 2019-03-07 ENCOUNTER — Other Ambulatory Visit: Payer: Medicaid Other

## 2019-03-07 VITALS — BP 128/74 | HR 98 | Temp 98.1°F | Resp 18 | Ht 67.0 in | Wt 167.4 lb

## 2019-03-07 DIAGNOSIS — C8194 Hodgkin lymphoma, unspecified, lymph nodes of axilla and upper limb: Secondary | ICD-10-CM | POA: Diagnosis not present

## 2019-03-07 DIAGNOSIS — C7889 Secondary malignant neoplasm of other digestive organs: Secondary | ICD-10-CM

## 2019-03-07 DIAGNOSIS — D509 Iron deficiency anemia, unspecified: Secondary | ICD-10-CM

## 2019-03-07 DIAGNOSIS — E538 Deficiency of other specified B group vitamins: Secondary | ICD-10-CM | POA: Insufficient documentation

## 2019-03-07 DIAGNOSIS — R51 Headache: Secondary | ICD-10-CM | POA: Diagnosis not present

## 2019-03-07 DIAGNOSIS — Z833 Family history of diabetes mellitus: Secondary | ICD-10-CM | POA: Insufficient documentation

## 2019-03-07 DIAGNOSIS — Z8249 Family history of ischemic heart disease and other diseases of the circulatory system: Secondary | ICD-10-CM

## 2019-03-07 DIAGNOSIS — E119 Type 2 diabetes mellitus without complications: Secondary | ICD-10-CM | POA: Diagnosis not present

## 2019-03-07 DIAGNOSIS — Z23 Encounter for immunization: Secondary | ICD-10-CM | POA: Diagnosis not present

## 2019-03-07 DIAGNOSIS — E559 Vitamin D deficiency, unspecified: Secondary | ICD-10-CM | POA: Insufficient documentation

## 2019-03-07 DIAGNOSIS — R5383 Other fatigue: Secondary | ICD-10-CM | POA: Diagnosis not present

## 2019-03-07 DIAGNOSIS — Z880 Allergy status to penicillin: Secondary | ICD-10-CM | POA: Diagnosis not present

## 2019-03-07 DIAGNOSIS — C8198 Hodgkin lymphoma, unspecified, lymph nodes of multiple sites: Secondary | ICD-10-CM

## 2019-03-07 DIAGNOSIS — Z95828 Presence of other vascular implants and grafts: Secondary | ICD-10-CM

## 2019-03-07 DIAGNOSIS — R42 Dizziness and giddiness: Secondary | ICD-10-CM | POA: Insufficient documentation

## 2019-03-07 DIAGNOSIS — R232 Flushing: Secondary | ICD-10-CM | POA: Insufficient documentation

## 2019-03-07 DIAGNOSIS — C787 Secondary malignant neoplasm of liver and intrahepatic bile duct: Secondary | ICD-10-CM

## 2019-03-07 DIAGNOSIS — C8190 Hodgkin lymphoma, unspecified, unspecified site: Secondary | ICD-10-CM

## 2019-03-07 LAB — CMP (CANCER CENTER ONLY)
ALT: 23 U/L (ref 0–44)
AST: 21 U/L (ref 15–41)
Albumin: 3.6 g/dL (ref 3.5–5.0)
Alkaline Phosphatase: 124 U/L (ref 38–126)
Anion gap: 9 (ref 5–15)
BUN: 17 mg/dL (ref 6–20)
CO2: 26 mmol/L (ref 22–32)
Calcium: 9.3 mg/dL (ref 8.9–10.3)
Chloride: 106 mmol/L (ref 98–111)
Creatinine: 0.77 mg/dL (ref 0.44–1.00)
GFR, Est AFR Am: >60 mL/min (ref >60)
GFR, Estimated: >60 mL/min (ref >60)
Glucose, Bld: 99 mg/dL (ref 70–99)
Potassium: 4.3 mmol/L (ref 3.5–5.1)
Sodium: 141 mmol/L (ref 135–145)
Total Bilirubin: 0.2 mg/dL — ABNORMAL LOW (ref 0.3–1.2)
Total Protein: 7.1 g/dL (ref 6.5–8.1)

## 2019-03-07 LAB — CBC WITH DIFFERENTIAL/PLATELET
Abs Immature Granulocytes: 0 10*3/uL (ref 0.00–0.07)
Basophils Absolute: 0 10*3/uL (ref 0.0–0.1)
Basophils Relative: 1 %
Eosinophils Absolute: 0.2 10*3/uL (ref 0.0–0.5)
Eosinophils Relative: 5 %
HCT: 36.9 % (ref 36.0–46.0)
Hemoglobin: 11.3 g/dL — ABNORMAL LOW (ref 12.0–15.0)
Immature Granulocytes: 0 %
Lymphocytes Relative: 28 %
Lymphs Abs: 1.2 10*3/uL (ref 0.7–4.0)
MCH: 29.7 pg (ref 26.0–34.0)
MCHC: 30.6 g/dL (ref 30.0–36.0)
MCV: 97.1 fL (ref 80.0–100.0)
Monocytes Absolute: 0.3 10*3/uL (ref 0.1–1.0)
Monocytes Relative: 7 %
Neutro Abs: 2.4 10*3/uL (ref 1.7–7.7)
Neutrophils Relative %: 59 %
Platelets: 347 10*3/uL (ref 150–400)
RBC: 3.8 MIL/uL — ABNORMAL LOW (ref 3.87–5.11)
RDW: 17 % — ABNORMAL HIGH (ref 11.5–15.5)
WBC: 4.1 10*3/uL (ref 4.0–10.5)
nRBC: 0 % (ref 0.0–0.2)

## 2019-03-07 LAB — SEDIMENTATION RATE: Sed Rate: 36 mm/hr — ABNORMAL HIGH (ref 0–22)

## 2019-03-07 LAB — MAGNESIUM: Magnesium: 1.7 mg/dL (ref 1.7–2.4)

## 2019-03-07 MED ORDER — PNEUMOCOCCAL 13-VAL CONJ VACC IM SUSP
0.5000 mL | Freq: Once | INTRAMUSCULAR | Status: AC
  Administered 2019-03-07: 12:00:00 0.5 mL via INTRAMUSCULAR
  Filled 2019-03-07: qty 0.5

## 2019-03-08 ENCOUNTER — Telehealth: Payer: Self-pay | Admitting: Hematology

## 2019-03-08 NOTE — Telephone Encounter (Signed)
Scheduled appt per 5/11 los. ° °A calendar will be mailed out. °

## 2019-03-09 ENCOUNTER — Ambulatory Visit
Admission: RE | Admit: 2019-03-09 | Discharge: 2019-03-09 | Disposition: A | Discharge status: Home / Self Care | Payer: Medicaid Other | Source: Ambulatory Visit

## 2019-03-09 ENCOUNTER — Ambulatory Visit
Admission: RE | Admit: 2019-03-09 | Discharge: 2019-03-09 | Disposition: A | Discharge status: Home / Self Care | Payer: Medicaid Other | Source: Ambulatory Visit | Attending: Radiation Oncology | Admitting: Radiation Oncology

## 2019-03-09 ENCOUNTER — Encounter: Payer: Self-pay | Admitting: Radiation Oncology

## 2019-03-09 ENCOUNTER — Other Ambulatory Visit: Payer: Self-pay

## 2019-03-09 VITALS — Ht 67.0 in | Wt 167.0 lb

## 2019-03-09 DIAGNOSIS — I871 Compression of vein: Secondary | ICD-10-CM

## 2019-03-09 DIAGNOSIS — C8198 Hodgkin lymphoma, unspecified, lymph nodes of multiple sites: Secondary | ICD-10-CM

## 2019-03-09 NOTE — Progress Notes (Signed)
See progress note under physician encounter. 

## 2019-03-09 NOTE — Progress Notes (Signed)
Radiation Oncology         [phone]  Initial Outpatient Consultation - Conducted via telephone due to current COVID-19 concerns for limiting patient exposure  I spoke with the patient to conduct this consult visit via telephone to spare the patient unnecessary potential exposure in the healthcare setting during the current COVID-19 pandemic. The patient was notified in advance and was offered a [place] meeting to allow for face to face communication but unfortunately reported that they did not have the appropriate resources/technology to support such a visit and instead preferred to proceed with a telephone consult.  ________________________________  Name: Cassandra Clark        MRN: [id number]  Date of Service: 03/09/2019 DOB: 1981/09/14  EX:[id number], No Pcp Per  Brunetta Genera, MD     REFERRING PHYSICIAN: Brunetta Genera, MD   DIAGNOSIS: The encounter diagnosis was Hodgkin lymphoma of lymph nodes of multiple regions, unspecified Hodgkin lymphoma type ([place]).   HISTORY OF PRESENT ILLNESS: Cassandra Clark is a 38 y.o. female originally  seen in October 2019 as an inpatient at the request of Dr. Alfredia Ferguson for a mediastinal mass concerning for lymphoma. The patient was symptomatic with a fever for several days and was concerned because she had a coughing episode at work and episode of emesis during that coughing episode. She was seen in the ED on 08/03/18 and a CXR revealed concerns for a possible mediastinal mass. A CT of the chest revealed a 10 x 6 cm mass in the mediastinum  Extending aroudn the aortic arch and in the anterior part of the mediastinum there was mass effect on the SVC, internal mammary arteries course through the tumor and involves the AP window.  There is pathologic supraclavicular adenopathy measuring 1.2 cm, and a lower level 4 node was 1.4 cm, right supraclavicular node 1 cm, pathologic left axillary and subpectoral adenopathy measuring 2.2 cm was noted.  An upper  mediastinal node below the thyroid gland measured 1.9 cm, and in the lungs the tumor in her mediastinum extending around the left upper lobe tracheobronchial tree centrally, with concerns for lowering the cross-sectional area of the vena cava 2.55 cm. She did undergo a biopsy of her left cervical adenopathy, and findings on 08/04/2017 revealed classic Hodgkin lymphoma.  Given these findings, she proceeded with steroids under the care of Dr. Irene Limbo.  Pet imaging on 08/13/2018 revealed her large hypermetabolic mass within the anterior mediastinum with extensive hypermetabolic left supraclavicular, axillary, left hilar and right iliac adenopathy.  Additional lesions were also seen in the caudate lobe of the liver and multifocal hypermetabolism was seen in the skeletal system.  She began AAVD systemic therapy on 08/09/2018.  She completed a total of 6 cycles as of 01/26/2019.  There had been a decrease in her volume of disease seen on interval imaging, and recent PET scan on 03/01/2019 revealed a small focus of Douville for activity in the left margin of the sternal manubrium with an SUV of 6, as well as scattered new hypermetabolic brown fat in the neck and upper chest.  There was otherwise stable Douville 3 activity in the bandlike residual density in the anterior mediastinum, level 2 activity in the residual left axillary nodes, no activity in the abdomen, persistent left ovarian dermoid, and her previously diffuse osseous disease had also resolved.  As her treatment has completed, she is asked to be evaluated to discuss options of initial site radiotherapy coverage given her bulky disease.  She plans  to have repeat pet imaging in approximately 10 weeks time.     PREVIOUS RADIATION THERAPY: No   PAST MEDICAL HISTORY:  Past Medical History:  Diagnosis Date   Anemia        PAST SURGICAL HISTORY: Past Surgical History:  Procedure Laterality Date   PICC LINE INSERTION Right 08/18/2018     FAMILY  HISTORY:  Family History  Problem Relation Age of Onset   Diabetes Mellitus II Father    Hypertension Father    Congestive Heart Failure Maternal Grandmother    Diabetes Mellitus II Maternal Grandmother    Diabetes Mellitus II Maternal Uncle    Congestive Heart Failure Maternal Uncle      SOCIAL HISTORY:  reports that she has never smoked. She has never used smokeless tobacco. She reports that she does not drink alcohol or use drugs.  The patient is legally separated.  She has 2 teenage children.  She is currently receiving unemployment as her disability was denied at the onset of her diagnosis.  She lives in [place], [place].   ALLERGIES: Penicillins   MEDICATIONS:  Current Outpatient Medications  Medication Sig Dispense Refill   acetaminophen (TYLENOL) 325 MG tablet Take 2 tablets (650 mg total) by mouth every 6 (six) hours as needed for mild pain, fever or headache. 30 tablet 1   albuterol (PROVENTIL) (2.5 MG/3ML) 0.083% nebulizer solution Take 3 mLs (2.5 mg total) by nebulization every 6 (six) hours as needed for wheezing or shortness of breath. 75 mL 12   B Complex-C (B-COMPLEX WITH VITAMIN C) tablet Take 1 tablet by mouth daily. 30 tablet 3   Cyanocobalamin (B-12) 1000 MCG SUBL Place 2,000 mcg under the tongue daily. 60 each 3   dexamethasone (DECADRON) 4 MG tablet Take 2 tablets by mouth once a day on the day after chemotherapy and then take 2 tablets two times a day for 2 days. Take with food. 30 tablet 1   ferrous sulfate 325 (65 FE) MG tablet Take 1 tablet (325 mg total) by mouth daily. (Patient not taking: Reported on 08/23/2018) 14 tablet 0   guaiFENesin (MUCINEX) 600 MG 12 hr tablet Take 1 tablet (600 mg total) by mouth 2 (two) times daily. (Patient not taking: Reported on 11/01/2018) 20 tablet o   HYDROcodone-acetaminophen (NORCO) 5-325 MG tablet Take 1-2 tablets by mouth every 6 (six) hours as needed for moderate pain or severe pain. 30 tablet 0    lidocaine-prilocaine (EMLA) cream Apply to affected area once 30 g 3   loratadine (CLARITIN) 10 MG tablet Take 10 mg by mouth daily.     LORazepam (ATIVAN) 0.5 MG tablet Take 1 tablet (0.5 mg total) by mouth every 6 (six) hours as needed (Nausea or vomiting). 30 tablet 0   nystatin (MYCOSTATIN) 100000 UNIT/ML suspension Use as directed 5 mLs (500,000 Units total) in the mouth or throat 4 (four) times daily. (Patient not taking: Reported on 11/01/2018) 60 mL 0   ondansetron (ZOFRAN) 8 MG tablet Take 1 tablet (8 mg total) by mouth 2 (two) times daily as needed. Start on the third day after chemotherapy. 30 tablet 1   prochlorperazine (COMPAZINE) 10 MG tablet Take 1 tablet (10 mg total) by mouth every 6 (six) hours as needed (Nausea or vomiting). 30 tablet 1   rivaroxaban (XARELTO) 10 MG TABS tablet Take 1 tablet (10 mg total) by mouth daily. 30 tablet 1   No current facility-administered medications for this encounter.      REVIEW  OF SYSTEMS: On review of systems, the patient reports that she is doing well overall.  She reports she was significantly tired during chemotherapy.  Currently she states that she is breathing well and denies any chest pain, shortness of breath fevers or chills.  She did gain approximately 20 pounds in the midst of treatment which she attributes to steroids.  She denies any fevers or chills.  A complete review of systems is obtained and is otherwise negative.     PHYSICAL EXAM:  Wt Readings from Last 3 Encounters:  03/07/19 167 lb 6.4 oz (75.9 kg)  01/24/19 171 lb 9.6 oz (77.8 kg)  01/10/19 172 lb 8 oz (78.2 kg)   Temp Readings from Last 3 Encounters:  03/07/19 98.1 F (36.7 C) (Oral)  01/26/19 98.7 F (37.1 C) (Oral)  01/24/19 97.8 F (36.6 C) (Oral)   BP Readings from Last 3 Encounters:  03/07/19 128/74  01/26/19 122/72  01/26/19 132/76   Pulse Readings from Last 3 Encounters:  03/07/19 98  01/26/19 (!) 106  01/26/19 96    /10  Unable to assess  due to encounter type   ECOG = 0  0 - Asymptomatic (Fully active, able to carry on all predisease activities without restriction)  1 - Symptomatic but completely ambulatory (Restricted in physically strenuous activity but ambulatory and able to carry out work of a light or sedentary nature. For example, light housework, office work)  2 - Symptomatic, <50% in bed during the day (Ambulatory and capable of all self care but unable to carry out any work activities. Up and about more than 50% of waking hours)  3 - Symptomatic, >50% in bed, but not bedbound (Capable of only limited self-care, confined to bed or chair 50% or more of waking hours)  4 - Bedbound (Completely disabled. Cannot carry on any self-care. Totally confined to bed or chair)  5 - Death   Eustace Pen MM, Creech RH, Tormey DC, et al. [phone]). "Toxicity and response criteria of the [facility] Group". [place] Oncol. 5 (6): 649-55    LABORATORY DATA:  Lab Results  Component Value Date   WBC 4.1 03/07/2019   HGB 11.3 (L) 03/07/2019   HCT 36.9 03/07/2019   MCV 97.1 03/07/2019   PLT 347 03/07/2019   Lab Results  Component Value Date   NA 141 03/07/2019   K 4.3 03/07/2019   CL 106 03/07/2019   CO2 26 03/07/2019   Lab Results  Component Value Date   ALT 23 03/07/2019   AST 21 03/07/2019   ALKPHOS 124 03/07/2019   BILITOT 0.2 (L) 03/07/2019      RADIOGRAPHY: Nm Pet Image Restag (ps) Skull Base To Thigh  Result Date: 03/01/2019 CLINICAL DATA:  Subsequent treatment strategy for Hodgkin's lymphoma. EXAM: NUCLEAR MEDICINE PET SKULL BASE TO THIGH TECHNIQUE: 8 point sick mCi F-18 FDG was injected intravenously. Full-ring PET imaging was performed from the skull base to thigh after the radiotracer. CT data was obtained and used for attenuation correction and anatomic localization. Fasting blood glucose: 85 mg/dl COMPARISON:  Multiple exams, including 11/22/2018 FINDINGS: Mediastinal blood pool activity: SUV  max 1.9 Background liver activity: SUV max 3.0 NECK: Hypermetabolic brown fat activity in the suboccipital, paraspinal, and supraclavicular fatty tissues. No pathologically enlarged or hypermetabolic adenopathy in the neck. Incidental CT findings: none CHEST: Bandlike density in the anterior mediastinum corresponding to prior nodal activity, current maximum SUV of this bandlike density is 2.0 (Deauville 3), previously 2.5. Morphologically  this bandlike density is similar to 1/20 04/2019 and prominently reduced in volume from 08/13/2018. As before, the left axillary lymph nodes which were previously enlarged and hypermetabolic on 84/13/2440 remain small in size, including a 0.7 cm left axillary lymph node on image 56/4 with maximum SUV of 0.9, Deauville 2. No new abnormal hypermetabolic at adenopathy. A small focus of accentuated activity in the left lower para-aortic region with maximum SUV 5.0 is thought to be hypermetabolic brown fat or a small amount of venous radiopharmaceutical, and is not thought to represent a significant lesion. Incidental CT findings: none ABDOMEN/PELVIS: No splenomegaly or focal splenic lesion. No adenopathy or significant hypermetabolic lesion. Incidental CT findings: Mature left ovarian dermoid, unchanged. SKELETON: There is some mixed density in the sternal manubrium, as before, along with a small focus of accentuated metabolic activity along the left margin of the sternal manubrium, maximum SUV 6.0 (Deauville 4). This is of uncertain significance in the context of the otherwise reduced diffuse background bony activity. It is possible that this actually represents brown fat tissue adjacent to the sternum. Incidental CT findings: none IMPRESSION: 1. A small focus of Deauville 4 activity along the left margin of the sternal manubrium is noted with maximum SUV of 6.0. This could reflect a small amount of recurrent tumor in this vicinity. The patient also has scattered new hypermetabolic  brown fat in the neck and upper chest, and alternative possibility is that this represents a small focus of misregistered hypermetabolic but benign brown fat adjacent to the sternum. Surveillance of this region is recommended. 2. Otherwise stable Deauville 3 activity in the bandlike residual density in the anterior mediastinum, and overall 2 activity in the small residual left axillary lymph nodes. No hypermetabolic activity in the abdomen. 3. Mature left ovarian dermoid. 4. Prior diffuse osseous activity has intervally resolved. Electronically Signed   By: Van Clines M.D.   On: 03/01/2019 13:25       IMPRESSION/PLAN: 1. Stage IVB Classical Hodgkin's lymphoma with near complete response to systemic therapy.  Dr. Lisbeth Renshaw has reviewed the patient's recent imaging, as well as her course to date.  He would like to offer the patient the option to consider initial site radiotherapy over the course of 3 weeks to the chest where her original disease was the bulkiest.  We discussed the risks, benefits, short and long-term effects of therapy and the goals for local control.  At the conclusion of our discussion, the patient would like to have a break from treatment, and repeat her PET scan with Dr. Irene Limbo approximately 10 weeks from now.  She did state that if she were to proceed with radiotherapy, she would prefer to have this performed here in [place] as we did discuss her proximity to our cancer center.  We will follow-up with the results of her next PET, and touch base by phone after she is had a chance to see Dr. Irene Limbo as well  Given current concerns for patient exposure during the COVID-19 pandemic, this encounter was conducted via telephone.  The patient has given verbal consent for this type of encounter. The time spent during this encounter was 20 minutes and 50% of that time was spent in the coordination of his care. The attendants for this meeting include Shona Simpson, [facility] and Annett Gula    During the encounter, Shona Simpson [facility] was located at [facility] Radiation Oncology Department.  Cassandra Clark  was located at home. The above documentation reflects  reflects my direct findings during this shared patient visit. Please see the separate note by Dr. Lisbeth Renshaw on this date for the remainder of the patient's plan of care.    Carola Rhine, PAC

## 2019-03-31 ENCOUNTER — Telehealth: Payer: Self-pay | Admitting: *Deleted

## 2019-03-31 ENCOUNTER — Encounter: Payer: Self-pay | Admitting: *Deleted

## 2019-03-31 NOTE — Telephone Encounter (Signed)
Per Dr. Irene Limbo it is not recommended that she share a room, so a letter can be supplied. Contacted patient.sshe asked to be called when it's ready as she has to turn it in by 6/10.

## 2019-03-31 NOTE — Telephone Encounter (Signed)
Called to state she has secured housing. They want her to share a bedroom with her 40 y/o daughter, with her son in the other bedroom. She was informed that if she provides a letter from her MD stating it would be a risk to her health if she shares a bedroom, they have a 3 bedroom available.

## 2019-05-29 NOTE — Progress Notes (Signed)
HEMATOLOGY/ONCOLOGY CLINIC NOTE  Date of Service: 05/30/2019  Patient Care Team: Patient, No Pcp Per as PCP - General (General Practice)  CHIEF COMPLAINTS/PURPOSE OF CONSULTATION:  F/u for Mx of Hodgkins lymphoma  HISTORY OF PRESENTING ILLNESS:   Cassandra Clark is a wonderful 38 y.o. female who has been referred to [place] by Dr. 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   Patient has a mild anemia and presented to the emergency room with new onset fevers chills and a 2-week history of worsening night sweats dry cough fatigue and anorexia.  Patient notes that she initially started feeling unwell from late June when she had noted increasing fatigue that required her to rest for longer periods of time.  She also noted that she at times felt lightheaded and dizzy when she bent forward.  She notes a decreased appetite.  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 She notes that she has not been able to do much of anything else other than rest at home and work to some degree at her job.  No chest pain no palpitations no diaphoresis no lower extremity edema.  No abdominal pain no nausea no vomiting no diarrhea.  No rectal bleeding. Notes that her periods have been regular.  In the emergency room the patient was noted to have a fever of 102.4 F and tachycardia related to this.  Blood counts showed that the patient was anemic with a hemoglobin around 8 with normal platelets and a WBC count of 3.6k.  Magnesium 1.9 and phosphorus of 2. She was also noted to be vitamin B12 deficient.  Most recent lab results (08/04/18) of CBC w/diff and CMP is as follows: all values are WNL except for WBC at 3.0k RBC at 3.64, HGB at 8.3, HCT at 82.7, MCV at 78.8, MCH at 22.8, MCHC at 28.9, RDW at 18.1, PLT at 459k, Lymphs abs at 300, Calcium at  8.0, Albumin at 2.7. 08/04/18 LDH at 203 08/04/18 Vitamin B12 at 135  X-ray of the chest done on 08/03/2018 showed Right paratracheal and left AP window masses likely representing lymphadenopathy. Consider CT chest for further characterization.  CT of the chest with contrast was subsequently done which showed 1. 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 In addition there are multiple nodular lesions in the spleen and possibly in the liver, as well as permeative destruction of the sternal manubrium with tumor rind anterior and posterior to the sternum. 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 Left-sided small cell lung cancer might give a similar appearance. Aggressive germ-cell tumor or thymic carcinoma or less likely differential diagnostic considerations. Tissue diagnosis recommended. 2. The anterior mediastinal mass is causing narrowing of the SVC, and mild invasion of the SVC with slight marginal thrombosis is not readily excluded. No large pulmonary embolus is identified.  Patient had additional lab testing which showed a near normal LDH was 203. Sedimentation rate was done subsequently and was noted to be elevated.  Urine pregnancy test was within normal limits.  Patient was noted to have significant headaches over the last 4 to 6 weeks and had a CT of the head without contrast in the ED on 07/04/2018 which showed  no acute intracranial normalities.  On review of systems, pt reports fevers, unquantified weight loss, shortness of breath, night sweats and denies overt chest pain, rashes.   Interval History:   Cassandra Clark returns today for management, evaluation after completing 6 planned cycles of her A-AVD treatment of her Hodgkin's Lymphoma. The patient's last visit with [place] was on 03/07/2019. The pt reports that she is  doing well overall.  The pt reports that she is feeling good. She notes that her knees have been popping and feel like they are going to give out. They are not red or swollen and she has not been walking more than usual. She notes that it is worse when she walks up steps or stands up from a sitting position.   The pt is eating well. She reports hot flashes and denies fevers and chills. She stopped taking her blood thinners a few weeks after she completed treatment, but she had been on them for 6 months.  Lab results today (05/30/2019) of CBC w/diff and CMP is as follows: all values are WNL except for WBC at 2.9k, 05/30/2019 CMP is pending 05/30/2019 Magnesium is pending  On review of systems, pt reports eating well, knee pain, and denies fevers, bleeding concerns, leg swelling, hand pain, night sweats, chills, and any other symptoms.   MEDICAL HISTORY:  Past Medical History:  Diagnosis Date  . Anemia     SURGICAL HISTORY: Past Surgical History:  Procedure Laterality Date  . PICC LINE INSERTION Right 08/18/2018    SOCIAL HISTORY: Social History   Socioeconomic History  . Marital status: Legally Separated    Spouse name: Not on file  . Number of children: 2  . Years of education: Not on file  . Highest education level: Not on file  Occupational History    Comment: no presently working  Social Needs  . Financial resource strain: Not on file  . Food insecurity    Worry: Not on file    Inability: Not on file  . Transportation needs    Medical: Not on file    Non-medical: Not on file  Tobacco Use  . Smoking status: Never Smoker  . Smokeless tobacco: Never Used  Substance and Sexual Activity  . Alcohol use: No  . Drug use: No    Comment: Drinks tea  . Sexual activity: Yes  Lifestyle  . Physical activity    Days per week: Not on file    Minutes per session: Not on file  . Stress: Not on file  Relationships  . Social Herbalist on phone: Not on file    Gets  together: Not on file    Attends religious service: Not on file    Active member of club or organization: Not on file    Attends meetings of clubs or organizations: Not on file    Relationship status: Not on file  . Intimate partner violence    Fear of current or ex partner: Not on file    Emotionally abused: Not on file    Physically abused: Not on file    Forced sexual activity: Not on file  Other Topics Concern  . Not on file  Social History Narrative  . Not on file    FAMILY HISTORY: Family History  Problem Relation Age of Onset  . Diabetes Mellitus II Father   . Hypertension Father   . Congestive Heart Failure Maternal Grandmother   . Diabetes Mellitus II Maternal Grandmother   .  Diabetes Mellitus II Maternal Uncle   . Congestive Heart Failure Maternal Uncle   . Cancer Neg Hx     ALLERGIES:  is allergic to penicillins.  MEDICATIONS:  Current Outpatient Medications  Medication Sig Dispense Refill  . acetaminophen (TYLENOL) 325 MG tablet Take 2 tablets (650 mg total) by mouth every 6 (six) hours as needed for mild pain, fever or headache. (Patient not taking: Reported on 03/09/2019) 30 tablet 1  . albuterol (PROVENTIL) (2.5 MG/3ML) 0.083% nebulizer solution Take 3 mLs (2.5 mg total) by nebulization every 6 (six) hours as needed for wheezing or shortness of breath. (Patient not taking: Reported on 03/09/2019) 75 mL 12  . B Complex-C (B-COMPLEX WITH VITAMIN C) tablet Take 1 tablet by mouth daily. 30 tablet 3  . Cyanocobalamin (B-12) 1000 MCG SUBL Place 2,000 mcg under the tongue daily. 60 each 3  . dexamethasone (DECADRON) 4 MG tablet Take 2 tablets by mouth once a day on the day after chemotherapy and then take 2 tablets two times a day for 2 days. Take with food. (Patient not taking: Reported on 03/09/2019) 30 tablet 1  . ferrous sulfate 325 (65 FE) MG tablet Take 1 tablet (325 mg total) by mouth daily. (Patient not taking: Reported on 08/23/2018) 14 tablet 0  . guaiFENesin  (MUCINEX) 600 MG 12 hr tablet Take 1 tablet (600 mg total) by mouth 2 (two) times daily. (Patient not taking: Reported on 11/01/2018) 20 tablet o  . HYDROcodone-acetaminophen (NORCO) 5-325 MG tablet Take 1-2 tablets by mouth every 6 (six) hours as needed for moderate pain or severe pain. (Patient not taking: Reported on 03/09/2019) 30 tablet 0  . lidocaine-prilocaine (EMLA) cream Apply to affected area once (Patient not taking: Reported on 03/09/2019) 30 g 3  . loratadine (CLARITIN) 10 MG tablet Take 10 mg by mouth daily.    Marland Kitchen LORazepam (ATIVAN) 0.5 MG tablet Take 1 tablet (0.5 mg total) by mouth every 6 (six) hours as needed (Nausea or vomiting). (Patient not taking: Reported on 03/09/2019) 30 tablet 0  . nystatin (MYCOSTATIN) 100000 UNIT/ML suspension Use as directed 5 mLs (500,000 Units total) in the mouth or throat 4 (four) times daily. (Patient not taking: Reported on 11/01/2018) 60 mL 0  . ondansetron (ZOFRAN) 8 MG tablet Take 1 tablet (8 mg total) by mouth 2 (two) times daily as needed. Start on the third day after chemotherapy. (Patient not taking: Reported on 03/09/2019) 30 tablet 1  . prochlorperazine (COMPAZINE) 10 MG tablet Take 1 tablet (10 mg total) by mouth every 6 (six) hours as needed (Nausea or vomiting). (Patient not taking: Reported on 03/09/2019) 30 tablet 1  . rivaroxaban (XARELTO) 10 MG TABS tablet Take 1 tablet (10 mg total) by mouth daily. (Patient not taking: Reported on 03/09/2019) 30 tablet 1   No current facility-administered medications for this visit.     REVIEW OF SYSTEMS:    A 10+ POINT REVIEW OF SYSTEMS WAS OBTAINED including neurology, dermatology, psychiatry, cardiac, respiratory, lymph, extremities, GI, GU, Musculoskeletal, constitutional, breasts, reproductive, HEENT.  All pertinent positives are noted in the HPI.  All others are negative.   PHYSICAL EXAMINATION:  ECOG PERFORMANCE STATUS: 1 - Symptomatic but completely ambulatory  Vitals:   05/30/19 1410  BP: 114/76   Pulse: 85  Resp: 18  Temp: 98 F (36.7 C)  SpO2: 99%   Filed Weights   05/30/19 1410  Weight: 164 lb 6.4 oz (74.6 kg)   .Body mass index is 25.75 kg/m.  GENERAL:alert, in no acute distress and comfortable SKIN: no acute rashes, no significant lesions EYES: conjunctiva are pink and non-injected, sclera anicteric OROPHARYNX: MMM, no exudates, no oropharyngeal erythema or ulceration NECK: supple, no JVD LYMPH:  no palpable lymphadenopathy in the cervical, axillary or inguinal regions LUNGS: clear to auscultation b/l with normal respiratory effort HEART: regular rate & rhythm ABDOMEN:  normoactive bowel sounds , non tender, not distended. No palpable hepatosplenomegaly.  Extremity: no pedal edema PSYCH: alert & oriented x 3 with fluent speech NEURO: no focal motor/sensory deficits   LABORATORY DATA:  I have reviewed the data as listed  CBC Latest Ref Rng & Units 05/30/2019 03/07/2019 01/24/2019  WBC 4.0 - 10.5 K/uL 2.9(L) 4.1 9.6  Hemoglobin 12.0 - 15.0 g/dL 12.6 11.3(L) 10.4(L)  Hematocrit 36.0 - 46.0 % 39.4 36.9 33.8(L)  Platelets 150 - 400 K/uL 301 347 358    . CMP Latest Ref Rng & Units 05/30/2019 03/07/2019 01/24/2019  Glucose 70 - 99 mg/dL 95 99 257(H)  BUN 6 - 20 mg/dL 21(H) 17 9  Creatinine 0.44 - 1.00 mg/dL 0.76 0.77 0.73  Sodium 135 - 145 mmol/L 142 141 140  Potassium 3.5 - 5.1 mmol/L 4.0 4.3 3.6  Chloride 98 - 111 mmol/L 107 106 104  CO2 22 - 32 mmol/L 28 26 24   Calcium 8.9 - 10.3 mg/dL 9.7 9.3 8.5(L)  Total Protein 6.5 - 8.1 g/dL 7.4 7.1 6.7  Total Bilirubin 0.3 - 1.2 mg/dL 0.3 0.2(L) 0.2(L)  Alkaline Phos 38 - 126 U/L 92 124 131(H)  AST 15 - 41 U/L 18 21 36  ALT 0 - 44 U/L 15 23 109(H)    08/04/18 Left Cervical Core Biopsy:   08/06/18 BM Bx:    RADIOGRAPHIC STUDIES: I have personally reviewed the radiological images as listed and agreed with the findings in the report. No results found.  ASSESSMENT & PLAN:   38 y.o. 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 LDH - no significantly elevated. 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 With cytopenias concern for BM involvement with Hodgkins lymphoma  08/05/18 ECHO revealed LV EF of 65-70%. No pericardialeffusion.   08/06/18 BM Bx revealed involvement by Hodgkin's lymphoma  08/13/18 PET/CT revealed Large hypermetabolic mass within the anterior mediastinum identified compatible with lymphoma, Deauville criteria 5. There is extensive hypermetabolic left supraclavicular, left axillary, left hilar and right iliac adenopathy, Deauville criteria 4 and 5. Additional lesions are identified within the caudate lobe of liver and spleen, Deauville criteria 4. Multifocal hypermetabolic bone lesions are also identified, Deauville criteria 5   Pt began C1 AAVD on 08/09/18  Delayed C3D15 for pneumonia admission on 10/16/18 to 10/19/18  11/22/18 PET/CT revealed "Reduction in volume and no significant metabolic activity within anterior mediastinal nodes and LEFT axillary nodes ( Deauville 1). 2. No focal activity within the liver or spleen ( Deauville 1) 3. No new or progressive adenopathy. 4. Marked increase in marrow activity throughout the axillary and appendicular skeleton consistent with GCSF type response. 5. 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 This could reflect a small amount of recurrent tumor in this vicinity. The patient also has scattered new hypermetabolic brown fat in the neck and upper chest, and  alternative possibility is that this represents a small focus of misregistered hypermetabolic but benign brown fat adjacent to the sternum. Surveillance of this region is recommended. 2. Otherwise stable Deauville 3 activity in the bandlike residual density in the anterior mediastinum, and overall 2 activity in the small residual left axillary lymph nodes. No hypermetabolic activity in the abdomen. 3. Mature left ovarian dermoid. 4. Prior diffuse osseous activity has intervally resolved."  2. Liver and splenic lesions concerning for involvement withCHL  3. S/p SVC compression due to anterior mediastinal mass - no upper extremity , neck or face swelling or change in visiion to suggest overt SVC syndrome  4. Microcytic anemia - normal iron. Likely anemia or chronic disease  5. B12 deficiency due to Pernicious anemia  PLAN:  -Discussed pt labwork today, 05/30/2019; pt is not anemic anymore, white counts are slightly low -Recommend pt have discussion with Rad Onc in the setting of having had a bulky initial tumor for consideration of RT vs watchful observation -Recommended staying up to date with annual flu vaccine and the every 5 year pneumonia vaccines Prevnar and Pneumovax -No live virus vaccines for 2 years -Continue Vitamin B12 replacement2015mcg SL daily -Schedule PET scan in the next week -Will see the pt back by phone to discuss results of PET scan   PET/CT in 1 week Phone visit in 2 weeks   All of the patients questions were answered with apparent satisfaction. The patient knows to call the clinic with any problems, questions or concerns.  The total time spent in the appt was 15 minutes and more than 50% was on counseling and direct patient cares.   Sullivan Lone MD [place] AAHIVMS [facility] [employer] [facility] [employer] Hematology/Oncology Physician [facility]  (Office):       [phone] (Work cell):  [phone] (Fax):           [phone]  05/30/2019 2:32 PM  I, De Burrs, am  acting as a scribe for Dr. Irene Limbo  .I have reviewed the above documentation for accuracy and completeness, and I agree with the above. Brunetta Genera MD

## 2019-05-30 ENCOUNTER — Inpatient Hospital Stay: Payer: Medicaid Other

## 2019-05-30 ENCOUNTER — Inpatient Hospital Stay (HOSPITAL_BASED_OUTPATIENT_CLINIC_OR_DEPARTMENT_OTHER): Payer: Medicaid Other | Admitting: Hematology

## 2019-05-30 ENCOUNTER — Other Ambulatory Visit: Payer: Self-pay

## 2019-05-30 ENCOUNTER — Inpatient Hospital Stay: Payer: Medicaid Other | Attending: Hematology

## 2019-05-30 VITALS — BP 114/76 | HR 85 | Temp 98.0°F | Resp 18 | Ht 67.0 in | Wt 164.4 lb

## 2019-05-30 DIAGNOSIS — C8198 Hodgkin lymphoma, unspecified, lymph nodes of multiple sites: Secondary | ICD-10-CM | POA: Diagnosis not present

## 2019-05-30 DIAGNOSIS — C8194 Hodgkin lymphoma, unspecified, lymph nodes of axilla and upper limb: Secondary | ICD-10-CM | POA: Diagnosis not present

## 2019-05-30 DIAGNOSIS — Z23 Encounter for immunization: Secondary | ICD-10-CM | POA: Diagnosis present

## 2019-05-30 DIAGNOSIS — R232 Flushing: Secondary | ICD-10-CM | POA: Insufficient documentation

## 2019-05-30 DIAGNOSIS — E538 Deficiency of other specified B group vitamins: Secondary | ICD-10-CM | POA: Insufficient documentation

## 2019-05-30 DIAGNOSIS — C8190 Hodgkin lymphoma, unspecified, unspecified site: Secondary | ICD-10-CM

## 2019-05-30 LAB — CBC WITH DIFFERENTIAL/PLATELET
Abs Immature Granulocytes: 0 10*3/uL (ref 0.00–0.07)
Basophils Absolute: 0 10*3/uL (ref 0.0–0.1)
Basophils Relative: 0 %
Eosinophils Absolute: 0 10*3/uL (ref 0.0–0.5)
Eosinophils Relative: 1 %
HCT: 39.4 % (ref 36.0–46.0)
Hemoglobin: 12.6 g/dL (ref 12.0–15.0)
Immature Granulocytes: 0 %
Lymphocytes Relative: 33 %
Lymphs Abs: 1 10*3/uL (ref 0.7–4.0)
MCH: 28.1 pg (ref 26.0–34.0)
MCHC: 32 g/dL (ref 30.0–36.0)
MCV: 87.8 fL (ref 80.0–100.0)
Monocytes Absolute: 0.2 10*3/uL (ref 0.1–1.0)
Monocytes Relative: 8 %
Neutro Abs: 1.7 10*3/uL (ref 1.7–7.7)
Neutrophils Relative %: 58 %
Platelets: 301 10*3/uL (ref 150–400)
RBC: 4.49 MIL/uL (ref 3.87–5.11)
RDW: 13.8 % (ref 11.5–15.5)
WBC: 2.9 10*3/uL — ABNORMAL LOW (ref 4.0–10.5)
nRBC: 0 % (ref 0.0–0.2)

## 2019-05-30 LAB — CMP (CANCER CENTER ONLY)
ALT: 15 U/L (ref 0–44)
AST: 18 U/L (ref 15–41)
Albumin: 3.9 g/dL (ref 3.5–5.0)
Alkaline Phosphatase: 92 U/L (ref 38–126)
Anion gap: 7 (ref 5–15)
BUN: 21 mg/dL — ABNORMAL HIGH (ref 6–20)
CO2: 28 mmol/L (ref 22–32)
Calcium: 9.7 mg/dL (ref 8.9–10.3)
Chloride: 107 mmol/L (ref 98–111)
Creatinine: 0.76 mg/dL (ref 0.44–1.00)
GFR, Est AFR Am: >60 mL/min (ref >60)
GFR, Estimated: >60 mL/min (ref >60)
Glucose, Bld: 95 mg/dL (ref 70–99)
Potassium: 4 mmol/L (ref 3.5–5.1)
Sodium: 142 mmol/L (ref 135–145)
Total Bilirubin: 0.3 mg/dL (ref 0.3–1.2)
Total Protein: 7.4 g/dL (ref 6.5–8.1)

## 2019-05-30 LAB — MAGNESIUM: Magnesium: 1.8 mg/dL (ref 1.7–2.4)

## 2019-05-30 MED ORDER — PNEUMOCOCCAL VAC POLYVALENT 25 MCG/0.5ML IJ INJ
0.5000 mL | INJECTION | Freq: Once | INTRAMUSCULAR | Status: AC
  Administered 2019-05-30: 0.5 mL via INTRAMUSCULAR
  Filled 2019-05-30: qty 0.5

## 2019-05-31 ENCOUNTER — Telehealth: Payer: Self-pay | Admitting: Hematology

## 2019-05-31 NOTE — Telephone Encounter (Signed)
Scheduled appt per 8/3 los.  Spoke with patient and she is aware of her appt date and time.

## 2019-06-06 ENCOUNTER — Ambulatory Visit (HOSPITAL_COMMUNITY): Admission: RE | Admit: 2019-06-06 | Payer: Medicaid Other | Source: Ambulatory Visit

## 2019-06-13 ENCOUNTER — Ambulatory Visit (HOSPITAL_COMMUNITY): Payer: Medicaid Other

## 2019-06-16 ENCOUNTER — Other Ambulatory Visit: Payer: Self-pay

## 2019-06-16 ENCOUNTER — Ambulatory Visit: Payer: Medicaid Other | Admitting: Hematology

## 2019-06-16 ENCOUNTER — Other Ambulatory Visit: Payer: Self-pay | Admitting: Hematology

## 2019-06-16 ENCOUNTER — Telehealth: Payer: Self-pay | Admitting: *Deleted

## 2019-06-16 ENCOUNTER — Telehealth: Payer: Self-pay | Admitting: Hematology

## 2019-06-16 DIAGNOSIS — C8198 Hodgkin lymphoma, unspecified, lymph nodes of multiple sites: Secondary | ICD-10-CM

## 2019-06-16 NOTE — Telephone Encounter (Signed)
Dr. Irene Limbo asked that patient be contacted with following info:  He has cancelled today's phone appt as PET was not completed due to insurance not authorizing PET. He has ordered CT c/a/p ordered in 1 month and a phone visit scheduled after those scans. He encouraged patient to continue follow up with Radiation Oncology during this time. Patient verbalized understanding. Schedule message sent for appt.

## 2019-06-16 NOTE — Telephone Encounter (Signed)
Scheduled appt per 8/20 sch message - unable to reach pt . Left message with appt date and time  [facility] to contact with scan appt . Left message with their nu,ber for patient to contact if they have not heard anything from them

## 2019-07-05 ENCOUNTER — Telehealth: Payer: Self-pay | Admitting: *Deleted

## 2019-07-05 NOTE — Telephone Encounter (Signed)
Attempted to contact patient - LVM on named VM: CT scan originally scheduled on 9/9 has been cancelled. Not authorized by insurance at this time. Insurance is requiring further clinical information prior to considering authorization. Encouraged to contact office for any questions.

## 2019-07-06 ENCOUNTER — Other Ambulatory Visit: Payer: Self-pay | Admitting: Hematology

## 2019-07-06 ENCOUNTER — Ambulatory Visit (HOSPITAL_COMMUNITY): Admission: RE | Admit: 2019-07-06 | Payer: Medicaid Other | Source: Ambulatory Visit

## 2019-07-12 ENCOUNTER — Encounter: Payer: Self-pay | Admitting: *Deleted

## 2019-07-12 ENCOUNTER — Telehealth: Payer: Self-pay | Admitting: *Deleted

## 2019-07-12 NOTE — Telephone Encounter (Signed)
Patient requested letter in support of having a cat to help with her anxiety. Per Dr. Irene Limbo: cannot certify the cat as an ESA animal - can give letter that the animal will likely help with her anxiety related to her significant medical condition. Patient in agreement. Letter prepared for patient.

## 2019-07-14 ENCOUNTER — Telehealth: Payer: Self-pay | Admitting: Hematology

## 2019-07-18 ENCOUNTER — Ambulatory Visit (HOSPITAL_COMMUNITY): Admission: RE | Admit: 2019-07-18 | Payer: Medicaid Other | Source: Ambulatory Visit

## 2019-07-20 ENCOUNTER — Telehealth: Payer: Self-pay | Admitting: Hematology

## 2019-07-20 NOTE — Telephone Encounter (Signed)
Contacted patient to verify telephone visit for pre reg °

## 2019-07-21 ENCOUNTER — Telehealth: Payer: Self-pay | Admitting: Hematology

## 2019-07-21 ENCOUNTER — Inpatient Hospital Stay: Payer: Medicaid Other | Admitting: Hematology

## 2019-07-21 NOTE — Telephone Encounter (Signed)
Scheduled appt per 9/24 sch message - unable to reach pt left message with appt date and time   

## 2019-07-25 NOTE — Progress Notes (Signed)
This encounter was created in error - please disregard.

## 2019-07-28 ENCOUNTER — Other Ambulatory Visit: Payer: Self-pay

## 2019-07-28 ENCOUNTER — Ambulatory Visit (HOSPITAL_COMMUNITY)
Admission: RE | Admit: 2019-07-28 | Discharge: 2019-07-28 | Disposition: A | Discharge status: Home / Self Care | Payer: Medicaid Other | Source: Ambulatory Visit | Attending: Hematology | Admitting: Hematology

## 2019-07-28 DIAGNOSIS — C8198 Hodgkin lymphoma, unspecified, lymph nodes of multiple sites: Secondary | ICD-10-CM | POA: Diagnosis not present

## 2019-07-28 MED ORDER — IOHEXOL 300 MG/ML  SOLN
30.0000 mL | Freq: Once | INTRAMUSCULAR | Status: AC | PRN
  Administered 2019-07-28: 30 mL via ORAL

## 2019-07-28 MED ORDER — SODIUM CHLORIDE (PF) 0.9 % IJ SOLN
INTRAMUSCULAR | Status: AC
  Filled 2019-07-28: qty 50

## 2019-07-28 MED ORDER — IOHEXOL 300 MG/ML  SOLN
100.0000 mL | Freq: Once | INTRAMUSCULAR | Status: AC | PRN
  Administered 2019-07-28: 100 mL via INTRAVENOUS

## 2019-07-29 NOTE — Progress Notes (Signed)
HEMATOLOGY/ONCOLOGY CLINIC NOTE  Date of Service: 08/03/2019  Patient Care Team: Patient, No Pcp Per as PCP - General (General Practice)  CHIEF COMPLAINTS/PURPOSE OF CONSULTATION:  F/u for Mx of Hodgkins lymphoma  HISTORY OF PRESENTING ILLNESS:   Cassandra Clark is a wonderful 38 y.o. female who has been referred to [place] by Dr. Alfredia Clark for evaluation and management of Mediastinal Mass.   Patient has a mild anemia and presented to the emergency room with new onset fevers chills and a 2-week history of worsening night sweats dry cough fatigue and anorexia.  Patient notes that she initially started feeling unwell from late June when she had noted increasing fatigue that required her to rest for longer periods of time.  She also noted that she at times felt lightheaded and dizzy when she bent forward.  She notes a decreased appetite.  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 She notes that she has not been able to do much of anything else other than rest at home and work to some degree at her job.  No chest pain no palpitations no diaphoresis no lower extremity edema.  No abdominal pain no nausea no vomiting no diarrhea.  No rectal bleeding. Notes that her periods have been regular.  In the emergency room the patient was noted to have a fever of 102.4 F and tachycardia related to this.  Blood counts showed that the patient was anemic with a hemoglobin around 8 with normal platelets and a WBC count of 3.6k.  Magnesium 1.9 and phosphorus of 2. She was also noted to be vitamin B12 deficient.  Most recent lab results (08/04/18) of CBC w/diff and CMP is as follows: all values are WNL except for WBC at 3.0k RBC at 3.64, HGB at 8.3, HCT at 82.7, MCV at 78.8, MCH at 22.8, MCHC at 28.9, RDW at 18.1, PLT at 459k, Lymphs abs at 300, Calcium at  8.0, Albumin at 2.7. 08/04/18 LDH at 203 08/04/18 Vitamin B12 at 135  X-ray of the chest done on 08/03/2018 showed Right paratracheal and left AP window masses likely representing lymphadenopathy. Consider CT chest for further characterization.  CT of the chest with contrast was subsequently done which showed 1. 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 In addition there are multiple nodular lesions in the spleen and possibly in the liver, as well as permeative destruction of the sternal manubrium with tumor rind anterior and posterior to the sternum. 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 Left-sided small cell lung cancer might give a similar appearance. Aggressive germ-cell tumor or thymic carcinoma or less likely differential diagnostic considerations. Tissue diagnosis recommended. 2. The anterior mediastinal mass is causing narrowing of the SVC, and mild invasion of the SVC with slight marginal thrombosis is not readily excluded. No large pulmonary embolus is identified.  Patient had additional lab testing which showed a near normal LDH was 203. Sedimentation rate was done subsequently and was noted to be elevated.  Urine pregnancy test was within normal limits.  Patient was noted to have significant headaches over the last 4 to 6 weeks and had a CT of the head without contrast in the ED on 07/04/2018 which showed  no acute intracranial normalities.  On review of systems, pt reports fevers, unquantified weight loss, shortness of breath, night sweats and denies overt chest pain, rashes.   Interval History:   I connected with  Cassandra Clark on 08/03/19 by telephone and verified that I am speaking with the correct person using two identifiers.   I discussed the limitations of evaluation and management by telemedicine. The  patient expressed understanding and agreed to proceed.  Other persons participating in the visit and their role in the encounter:     -Cassandra Clark, Medical Scribe  Patient's location: Home Provider's location: [facility] returns today for management, evaluation after completing 6 planned cycles of her A-AVD treatment of her Hodgkin's Lymphoma. The patient's last visit with [place] was on 05/30/2019. The pt reports that she is doing well overall.  The pt reports that she has been doing well and denies any new symptoms. She has not gotten her menstrual cycle back yet. Pt notes that her night sweats have stopped. Pt reports that she did have allergy symptoms last year and she had a runny nose yesterday. She also has occasional hot flashes.   Of note since the patient's last visit, pt has had CT C/A/P ([id number]) ([id number]) completed on 07/28/2019 with results revealing "1. There has been interval decrease in thickness and fullness of anterior mediastinal and AP window soft tissue, measuring 3.7 x 1.2 cm, previously 4.0 x 1.5 cm when measured similarly (series 2, image 17). 2. There is new soft tissue along the inner table of the left aspect of the manubrium measuring 2.1 x 1.3 cm with subtle adjacent erosion and sclerosis of the bone (series 2, image 15). Adjacent anterior mediastinal lymph node appears newly enlarged compared to prior examination although was very difficult to discretely appreciate on prior due to soft tissue stranding, measuring approximately 1.3 x 1.0 cm (series 2, image 14). A left axillary lymph node is slightly enlarged and more full, measuring 1.6 x 0.9 cm, previously 1.4 x 0.7 cm (series 2, image 17). These findings are in keeping with abnormal FDG avidity identified on prior PET-CT and consistent with recurrent disease. 3. Remaining small axillary and subpectoral lymph nodes are unchanged. No abnormally enlarged lymph nodes in the  abdomen or pelvis. 4.  Large left ovarian dermoid."  On review of systems, pt reports runny nose, hot flashes and denies night sweats, cough, fever, chills and any other symptoms.    MEDICAL HISTORY:  Past Medical History:  Diagnosis Date   Anemia     SURGICAL HISTORY: Past Surgical History:  Procedure Laterality Date   PICC LINE INSERTION Right 08/18/2018    SOCIAL HISTORY: Social History   Socioeconomic History   Marital status: Legally Separated    Spouse name: Not on file   Number of children: 2   Years of education: Not on file   Highest education level: Not on file  Occupational History    Comment: no presently working  Scientist, product/process development strain: Not on file   Food insecurity    Worry: Not on file    Inability: Not on file   Transportation needs    Medical: Not on file    Non-medical: Not on file  Tobacco Use   Smoking status: Never Smoker   Smokeless tobacco: Never Used  Substance and Sexual Activity   Alcohol use: No   Drug use: No    Comment:  Drinks tea   Sexual activity: Yes  Lifestyle   Physical activity    Days per week: Not on file    Minutes per session: Not on file   Stress: Not on file  Relationships   Social connections    Talks on phone: Not on file    Gets together: Not on file    Attends religious service: Not on file    Active member of club or organization: Not on file    Attends meetings of clubs or organizations: Not on file    Relationship status: Not on file   Intimate partner violence    Fear of current or ex partner: Not on file    Emotionally abused: Not on file    Physically abused: Not on file    Forced sexual activity: Not on file  Other Topics Concern   Not on file  Social History Narrative   Not on file    FAMILY HISTORY: Family History  Problem Relation Age of Onset   Diabetes Mellitus II Father    Hypertension Father    Congestive Heart Failure Maternal Grandmother     Diabetes Mellitus II Maternal Grandmother    Diabetes Mellitus II Maternal Uncle    Congestive Heart Failure Maternal Uncle    Cancer Neg Hx     ALLERGIES:  is allergic to penicillins.  MEDICATIONS:  Current Outpatient Medications  Medication Sig Dispense Refill   acetaminophen (TYLENOL) 325 MG tablet Take 2 tablets (650 mg total) by mouth every 6 (six) hours as needed for mild pain, fever or headache. (Patient not taking: Reported on 03/09/2019) 30 tablet 1   albuterol (PROVENTIL) (2.5 MG/3ML) 0.083% nebulizer solution Take 3 mLs (2.5 mg total) by nebulization every 6 (six) hours as needed for wheezing or shortness of breath. (Patient not taking: Reported on 03/09/2019) 75 mL 12   B Complex-C (B-COMPLEX WITH VITAMIN C) tablet Take 1 tablet by mouth daily. 30 tablet 3   Cyanocobalamin (B-12) 1000 MCG SUBL Place 2,000 mcg under the tongue daily. 60 each 3   dexamethasone (DECADRON) 4 MG tablet Take 2 tablets by mouth once a day on the day after chemotherapy and then take 2 tablets two times a day for 2 days. Take with food. (Patient not taking: Reported on 03/09/2019) 30 tablet 1   ferrous sulfate 325 (65 FE) MG tablet Take 1 tablet (325 mg total) by mouth daily. (Patient not taking: Reported on 08/23/2018) 14 tablet 0   guaiFENesin (MUCINEX) 600 MG 12 hr tablet Take 1 tablet (600 mg total) by mouth 2 (two) times daily. (Patient not taking: Reported on 11/01/2018) 20 tablet o   HYDROcodone-acetaminophen (NORCO) 5-325 MG tablet Take 1-2 tablets by mouth every 6 (six) hours as needed for moderate pain or severe pain. (Patient not taking: Reported on 03/09/2019) 30 tablet 0   lidocaine-prilocaine (EMLA) cream Apply to affected area once (Patient not taking: Reported on 03/09/2019) 30 g 3   loratadine (CLARITIN) 10 MG tablet Take 10 mg by mouth daily.     LORazepam (ATIVAN) 0.5 MG tablet Take 1 tablet (0.5 mg total) by mouth every 6 (six) hours as needed (Nausea or vomiting). (Patient not  taking: Reported on 03/09/2019) 30 tablet 0   nystatin (MYCOSTATIN) 100000 UNIT/ML suspension Use as directed 5 mLs (500,000 Units total) in the mouth or throat 4 (four) times daily. (Patient not taking: Reported on 11/01/2018) 60 mL 0   ondansetron (ZOFRAN) 8 MG tablet Take 1 tablet (8  mg total) by mouth 2 (two) times daily as needed. Start on the third day after chemotherapy. (Patient not taking: Reported on 03/09/2019) 30 tablet 1   prochlorperazine (COMPAZINE) 10 MG tablet Take 1 tablet (10 mg total) by mouth every 6 (six) hours as needed (Nausea or vomiting). (Patient not taking: Reported on 03/09/2019) 30 tablet 1   rivaroxaban (XARELTO) 10 MG TABS tablet Take 1 tablet (10 mg total) by mouth daily. (Patient not taking: Reported on 03/09/2019) 30 tablet 1   No current facility-administered medications for this visit.     REVIEW OF SYSTEMS:    A 10+ POINT REVIEW OF SYSTEMS WAS OBTAINED including neurology, dermatology, psychiatry, cardiac, respiratory, lymph, extremities, GI, GU, Musculoskeletal, constitutional, breasts, reproductive, HEENT.  All pertinent positives are noted in the HPI.  All others are negative.   PHYSICAL EXAMINATION:  ECOG PERFORMANCE STATUS: 1 - Symptomatic but completely ambulatory  There were no vitals filed for this visit. There were no vitals filed for this visit. .There is no height or weight on file to calculate BMI.  Phone visit  LABORATORY DATA:  I have reviewed the data as listed  CBC Latest Ref Rng & Units 05/30/2019 03/07/2019 01/24/2019  WBC 4.0 - 10.5 K/uL 2.9(L) 4.1 9.6  Hemoglobin 12.0 - 15.0 g/dL 12.6 11.3(L) 10.4(L)  Hematocrit 36.0 - 46.0 % 39.4 36.9 33.8(L)  Platelets 150 - 400 K/uL 301 347 358    . CMP Latest Ref Rng & Units 05/30/2019 03/07/2019 01/24/2019  Glucose 70 - 99 mg/dL 95 99 257(H)  BUN 6 - 20 mg/dL 21(H) 17 9  Creatinine 0.44 - 1.00 mg/dL 0.76 0.77 0.73  Sodium 135 - 145 mmol/L 142 141 140  Potassium 3.5 - 5.1 mmol/L 4.0 4.3 3.6    Chloride 98 - 111 mmol/L 107 106 104  CO2 22 - 32 mmol/L 28 26 24   Calcium 8.9 - 10.3 mg/dL 9.7 9.3 8.5(L)  Total Protein 6.5 - 8.1 g/dL 7.4 7.1 6.7  Total Bilirubin 0.3 - 1.2 mg/dL 0.3 0.2(L) 0.2(L)  Alkaline Phos 38 - 126 U/L 92 124 131(H)  AST 15 - 41 U/L 18 21 36  ALT 0 - 44 U/L 15 23 109(H)    08/04/18 Left Cervical Core Biopsy:   08/06/18 BM Bx:    RADIOGRAPHIC STUDIES: I have personally reviewed the radiological images as listed and agreed with the findings in the report. Ct Chest W Contrast  Result Date: 07/28/2019 CLINICAL DATA:  Hodgkin's lymphoma, assess for treatment response EXAM: CT CHEST, ABDOMEN, AND PELVIS WITH CONTRAST TECHNIQUE: Multidetector CT imaging of the chest, abdomen and pelvis was performed following the standard protocol during bolus administration of intravenous contrast. CONTRAST:  128mL OMNIPAQUE IOHEXOL 300 MG/ML SOLN, additional oral enteric contrast COMPARISON:  PET-CT, 03/01/2019, 11/22/2018, CT chest, 08/04/2018 FINDINGS: CT CHEST FINDINGS Cardiovascular: No significant vascular findings. Normal heart size. No pericardial effusion. Mediastinum/Nodes: There has been interval decrease in thickness and fullness of anterior mediastinal and AP window soft tissue, measuring 3.7 x 1.2 cm, previously 4.0 x 1.5 cm when measured similarly (series 2, image 17). There is new soft tissue along the inner table of the left aspect of the manubrium measuring 2.1 x 1.3 cm with subtle adjacent erosion and sclerosis of the bone (series 2, image 15). Adjacent anterior mediastinal lymph node appears newly enlarged compared to prior examination although was very difficult to discretely appreciate on prior due to soft tissue stranding, measuring approximately 1.3 x 1.0 cm (series 2, image 14).  A left axillary lymph node is slightly enlarged and more full, measuring 1.6 x 0.9 cm, previously 1.4 x 0.7 cm (series 2, image 17). Remaining small axillary and subpectoral lymph nodes are  unchanged. Thyroid gland, trachea, and esophagus demonstrate no significant findings. Lungs/Pleura: Lungs are clear. No pleural effusion or pneumothorax. Musculoskeletal: No chest wall mass or suspicious bone lesions identified. CT ABDOMEN PELVIS FINDINGS Hepatobiliary: No solid liver abnormality is seen. No gallstones, gallbladder wall thickening, or biliary dilatation. Pancreas: Unremarkable. No pancreatic ductal dilatation or surrounding inflammatory changes. Spleen: Normal in size without significant abnormality. Adrenals/Urinary Tract: Adrenal glands are unremarkable. Kidneys are normal, without renal calculi, solid lesion, or hydronephrosis. Bladder is unremarkable. Stomach/Bowel: Stomach is within normal limits. Appendix appears normal. No evidence of bowel wall thickening, distention, or inflammatory changes. Vascular/Lymphatic: No significant vascular findings are present. No enlarged abdominal or pelvic lymph nodes. Reproductive: Large fat and calcium containing left ovarian dermoid measuring 7.1 x 4.6 cm (series 2, image 102). Other: No abdominal wall hernia or abnormality. No abdominopelvic ascites. Musculoskeletal: No acute or significant osseous findings. IMPRESSION: 1. There has been interval decrease in thickness and fullness of anterior mediastinal and AP window soft tissue, measuring 3.7 x 1.2 cm, previously 4.0 x 1.5 cm when measured similarly (series 2, image 17). 2. There is new soft tissue along the inner table of the left aspect of the manubrium measuring 2.1 x 1.3 cm with subtle adjacent erosion and sclerosis of the bone (series 2, image 15). Adjacent anterior mediastinal lymph node appears newly enlarged compared to prior examination although was very difficult to discretely appreciate on prior due to soft tissue stranding, measuring approximately 1.3 x 1.0 cm (series 2, image 14). A left axillary lymph node is slightly enlarged and more full, measuring 1.6 x 0.9 cm, previously 1.4 x 0.7 cm  (series 2, image 17). These findings are in keeping with abnormal FDG avidity identified on prior PET-CT and consistent with recurrent disease. 3. Remaining small axillary and subpectoral lymph nodes are unchanged. No abnormally enlarged lymph nodes in the abdomen or pelvis. 4.  Large left ovarian dermoid. Electronically Signed   By: Eddie Candle M.D.   On: 07/28/2019 13:17   Ct Abdomen Pelvis W Contrast  Result Date: 07/28/2019 CLINICAL DATA:  Hodgkin's lymphoma, assess for treatment response EXAM: CT CHEST, ABDOMEN, AND PELVIS WITH CONTRAST TECHNIQUE: Multidetector CT imaging of the chest, abdomen and pelvis was performed following the standard protocol during bolus administration of intravenous contrast. CONTRAST:  169mL OMNIPAQUE IOHEXOL 300 MG/ML SOLN, additional oral enteric contrast COMPARISON:  PET-CT, 03/01/2019, 11/22/2018, CT chest, 08/04/2018 FINDINGS: CT CHEST FINDINGS Cardiovascular: No significant vascular findings. Normal heart size. No pericardial effusion. Mediastinum/Nodes: There has been interval decrease in thickness and fullness of anterior mediastinal and AP window soft tissue, measuring 3.7 x 1.2 cm, previously 4.0 x 1.5 cm when measured similarly (series 2, image 17). There is new soft tissue along the inner table of the left aspect of the manubrium measuring 2.1 x 1.3 cm with subtle adjacent erosion and sclerosis of the bone (series 2, image 15). Adjacent anterior mediastinal lymph node appears newly enlarged compared to prior examination although was very difficult to discretely appreciate on prior due to soft tissue stranding, measuring approximately 1.3 x 1.0 cm (series 2, image 14). A left axillary lymph node is slightly enlarged and more full, measuring 1.6 x 0.9 cm, previously 1.4 x 0.7 cm (series 2, image 17). Remaining small axillary and subpectoral lymph nodes are  unchanged. Thyroid gland, trachea, and esophagus demonstrate no significant findings. Lungs/Pleura: Lungs are  clear. No pleural effusion or pneumothorax. Musculoskeletal: No chest wall mass or suspicious bone lesions identified. CT ABDOMEN PELVIS FINDINGS Hepatobiliary: No solid liver abnormality is seen. No gallstones, gallbladder wall thickening, or biliary dilatation. Pancreas: Unremarkable. No pancreatic ductal dilatation or surrounding inflammatory changes. Spleen: Normal in size without significant abnormality. Adrenals/Urinary Tract: Adrenal glands are unremarkable. Kidneys are normal, without renal calculi, solid lesion, or hydronephrosis. Bladder is unremarkable. Stomach/Bowel: Stomach is within normal limits. Appendix appears normal. No evidence of bowel wall thickening, distention, or inflammatory changes. Vascular/Lymphatic: No significant vascular findings are present. No enlarged abdominal or pelvic lymph nodes. Reproductive: Large fat and calcium containing left ovarian dermoid measuring 7.1 x 4.6 cm (series 2, image 102). Other: No abdominal wall hernia or abnormality. No abdominopelvic ascites. Musculoskeletal: No acute or significant osseous findings. IMPRESSION: 1. There has been interval decrease in thickness and fullness of anterior mediastinal and AP window soft tissue, measuring 3.7 x 1.2 cm, previously 4.0 x 1.5 cm when measured similarly (series 2, image 17). 2. There is new soft tissue along the inner table of the left aspect of the manubrium measuring 2.1 x 1.3 cm with subtle adjacent erosion and sclerosis of the bone (series 2, image 15). Adjacent anterior mediastinal lymph node appears newly enlarged compared to prior examination although was very difficult to discretely appreciate on prior due to soft tissue stranding, measuring approximately 1.3 x 1.0 cm (series 2, image 14). A left axillary lymph node is slightly enlarged and more full, measuring 1.6 x 0.9 cm, previously 1.4 x 0.7 cm (series 2, image 17). These findings are in keeping with abnormal FDG avidity identified on prior PET-CT and  consistent with recurrent disease. 3. Remaining small axillary and subpectoral lymph nodes are unchanged. No abnormally enlarged lymph nodes in the abdomen or pelvis. 4.  Large left ovarian dermoid. Electronically Signed   By: Eddie Candle M.D.   On: 07/28/2019 13:17    ASSESSMENT & PLAN:   38 y.o. 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 LDH - no significantly elevated. 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 With cytopenias concern for BM involvement with Hodgkins lymphoma  08/05/18 ECHO revealed LV EF of 65-70%. No pericardialeffusion.   08/06/18 BM Bx revealed involvement by Hodgkin's lymphoma  08/13/18 PET/CT revealed Large hypermetabolic mass within the anterior mediastinum identified compatible with lymphoma, Deauville criteria 5. There is extensive hypermetabolic left supraclavicular, left axillary, left hilar and right iliac adenopathy, Deauville criteria 4 and 5. Additional lesions are identified within the caudate lobe of liver and spleen, Deauville criteria 4. Multifocal hypermetabolic bone lesions are also identified, Deauville criteria 5   Pt began C1 AAVD on 08/09/18  Delayed C3D15 for pneumonia admission on 10/16/18 to 10/19/18  11/22/18 PET/CT revealed "Reduction in volume and no significant metabolic activity within anterior mediastinal nodes and LEFT axillary nodes ( Deauville 1). 2. No focal activity within the liver or spleen ( Deauville 1) 3. No new or progressive adenopathy. 4. Marked increase in marrow activity throughout the axillary and appendicular skeleton consistent with GCSF type response. 5. 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 This could reflect a small amount of recurrent tumor in this vicinity. The patient also has scattered new hypermetabolic brown fat in the neck and upper chest, and alternative possibility is that this represents a small focus of misregistered hypermetabolic but benign brown fat adjacent to the sternum. Surveillance of this region is recommended. 2. Otherwise stable Deauville 3 activity in the bandlike residual density in the anterior mediastinum, and overall 2 activity in the small residual left axillary lymph nodes. No hypermetabolic activity in the abdomen. 3. Mature left ovarian dermoid. 4. Prior diffuse osseous activity has intervally resolved."  2. Liver and splenic lesions concerning for involvement withCHL  3. S/p SVC compression due to anterior mediastinal mass - no upper extremity , neck or face swelling or change in visiion to suggest overt SVC syndrome  4. Microcytic anemia - normal iron. Likely anemia or chronic disease  5. B12 deficiency due to Pernicious anemia  PLAN:  -Discussed pt labwork, 05/30/19; all values are WNL except for WBC at 2.9K, BUN at 21. 05/30/2019 Magnesium at 1.8 -Discussed 07/28/2019 CT C/A/P ([id number]) ([id number]) which revealed "1. There has been interval decrease in thickness and fullness of anterior mediastinal and AP window soft tissue, measuring 3.7 x 1.2 cm, previously 4.0 x 1.5 cm when measured similarly (series 2, image 17). 2. There is new soft tissue along the inner table of the left aspect of the manubrium measuring 2.1 x 1.3 cm with subtle adjacent erosion and sclerosis of the bone (series 2, image 15). Adjacent anterior mediastinal lymph node appears newly enlarged compared to prior examination although was very difficult to discretely appreciate on prior due to soft tissue  stranding, measuring approximately 1.3 x 1.0 cm (series 2, image 14). A left axillary lymph node is slightly enlarged and more full, measuring 1.6 x 0.9 cm, previously 1.4 x 0.7 cm (series 2, image 17). These findings are in keeping with abnormal FDG avidity identified on prior PET-CT and consistent with recurrent disease. 3. Remaining small axillary and subpectoral lymph nodes are unchanged. No abnormally enlarged lymph nodes in the abdomen or pelvis. 4. Large left ovarian dermoid." -Recommended staying up to date with annual flu vaccine and the every 5 year pneumonia vaccines Prevnar and Pneumovax -No live virus vaccines for 2 years -Continue Vitamin B12 replacement205mcg SL daily -Findings concerning for recurrent lymphoma due to newly enlarged mediastinal lymph node & borderline left axillary lymph node  -Advised pt that it is difficult to know if active lymphoma w/o PET Scan -Recommended that the pt continue to eat well, drink at least 48-64 oz of water each day, and walk 20-30 minutes each day.  -Plan for repeat PET/CT Scan & BM Bx of the left axillary lymph node  -Will order PET/CT in 1 week -Will see back in 2 weeks    FOLLOW UP: PET/CT in 1 week with labs RTC with Dr Irene Limbo in 2 weeks  The total time spent in the appt was 20 minutes and more than 50% was on counseling and direct patient cares.  All of the patient's questions were answered with apparent satisfaction. The patient knows to call the clinic with any problems, questions or concerns.    Sullivan Lone MD [place] AAHIVMS [facility] Hematology/Oncology Physician [facility]  (Office):       [phone] (Work cell):  [phone] (Fax):  [phone]  08/03/2019 3:01 PM  I, Cassandra Clark, am acting as a Education administrator for Dr. Sullivan Lone.   .I have reviewed the above documentation for accuracy and completeness, and I agree with the above. Brunetta Genera MD

## 2019-08-03 ENCOUNTER — Inpatient Hospital Stay: Payer: Medicaid Other | Attending: Hematology | Admitting: Hematology

## 2019-08-03 DIAGNOSIS — Z7901 Long term (current) use of anticoagulants: Secondary | ICD-10-CM | POA: Insufficient documentation

## 2019-08-03 DIAGNOSIS — D649 Anemia, unspecified: Secondary | ICD-10-CM

## 2019-08-03 DIAGNOSIS — C8198 Hodgkin lymphoma, unspecified, lymph nodes of multiple sites: Secondary | ICD-10-CM | POA: Insufficient documentation

## 2019-08-03 DIAGNOSIS — D509 Iron deficiency anemia, unspecified: Secondary | ICD-10-CM | POA: Insufficient documentation

## 2019-08-03 DIAGNOSIS — Z79899 Other long term (current) drug therapy: Secondary | ICD-10-CM | POA: Insufficient documentation

## 2019-08-03 DIAGNOSIS — E538 Deficiency of other specified B group vitamins: Secondary | ICD-10-CM | POA: Insufficient documentation

## 2019-08-04 ENCOUNTER — Telehealth: Payer: Self-pay | Admitting: Hematology

## 2019-08-04 NOTE — Telephone Encounter (Signed)
Scheduled appt per 10/7 los. ° °Spoke with pt and she is aware of the appt date and time. °

## 2019-08-08 IMAGING — CT NM PET TUM IMG INITIAL (PI) SKULL BASE T - THIGH
1 of 9 series · 1 of 25 positions shown · non-contrast
Comparison: None.

CLINICAL DATA: Initial treatment strategy for Hodgkin's lymphoma.

EXAM:
NUCLEAR MEDICINE PET SKULL BASE TO THIGH
TECHNIQUE: 8.0 mCi F-18 FDG was injected intravenously. Full-ring PET imaging
was performed from the skull base to thigh after the radiotracer. CT
data was obtained and used for attenuation correction and anatomic
localization.
Fasting blood glucose: 118 mg/dl

[Series 4: ct sk_thigh 5.0 b31f · axial · 5.0mm · 0.98mm/px · 1 of 227 slices shown]
[im 227/227  brain]
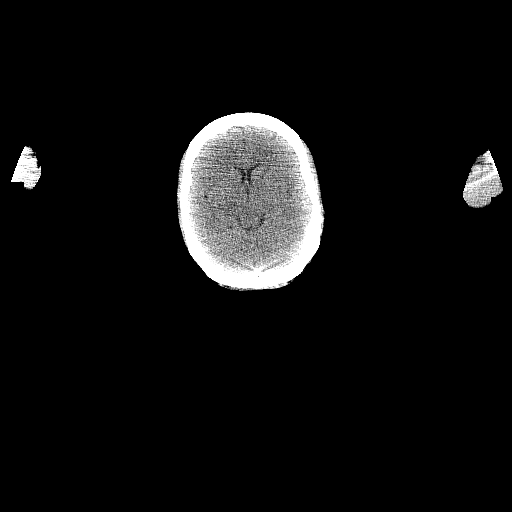

[1 of 25 positions shown; findings below may reference images not displayed]

FINDINGS: Mediastinal blood pool activity: SUV max

Liver activity: SUV max 4.06.

NECK: No hypermetabolic lymph nodes in the neck.

Incidental CT findings: none

CHEST: Multiple hypermetabolic left supraclavicular and left
axillary lymph nodes.

-index left supraclavicular lymph node has an SUV max of 5.04,
[HOSPITAL] criteria 4.

-index left axillary lymph node measures 1.4 cm and has an SUV max
of 9.09, [HOSPITAL] criteria 5

-index hypermetabolic mass within the anterior mediastinum measures
5.6 cm and has an SUV max of 8.65, [HOSPITAL] criteria 5

-index lesion within the left hilum measures 3.4 cm and has an SUV
max of 11.95, [HOSPITAL] criteria 5.

No hypermetabolic pulmonary nodules.

Incidental CT findings: none

ABDOMEN/PELVIS: Several areas of abnormal uptake identified within
the abdomen and pelvis.

-index lesion within the caudate lobe of liver measures
approximately 1.5 cm and has an SUV max of 6.31, [HOSPITAL] criteria
4.

-index lesion within the lateral aspect of the left mid spleen
measures approximately 1.5 cm within SUV max of 5.5, [HOSPITAL]
criteria 4.

-Mild right iliac adenopathy identified. Index right common iliac
node measures 1.1 cm and has an SUV max of 7.34, [HOSPITAL] criteria
5.

Normal appearance of the adrenal glands. Pancreas unremarkable. No
hypermetabolic abdominal adenopathy.

Incidental CT findings: Large dermoid within the left posterior
pelvis measures 8.3 cm.

SKELETON: Mild diffuse increased radiotracer uptake is identified
throughout the bone marrow.

Multifocal areas of moderate to marked increased uptake are also
noted.

-index lesion with soft tissue component involving the manubrium
measures 4.2 cm within SUV max of 17.4, [HOSPITAL] criteria 5. There
are associated mixed lytic and sclerotic changes involving the
manubrium.

-index lesion within the right sacrum measures 3 cm and has an SUV
max of 10.48, [HOSPITAL] criteria 5.

Incidental CT findings: none
IMPRESSION: 1. Large hypermetabolic mass within the anterior mediastinum
identified compatible with lymphoma, [HOSPITAL] criteria 5. There is
extensive hypermetabolic left supraclavicular, left axillary, left
hilar and right iliac adenopathy, [HOSPITAL] criteria 4 and 5.
Additional lesions are identified within the caudate lobe of liver
and spleen, [HOSPITAL] criteria 4. Multifocal hypermetabolic bone
lesions are also identified, [HOSPITAL] criteria 5.

## 2019-08-09 ENCOUNTER — Telehealth: Payer: Self-pay | Admitting: Hematology

## 2019-08-09 NOTE — Telephone Encounter (Signed)
Returned patient's phone call regarding rescheduling an appointment, per patient's request 10/14 has moved to 10/15 for PET scan.

## 2019-08-10 ENCOUNTER — Inpatient Hospital Stay: Payer: Medicaid Other

## 2019-08-11 ENCOUNTER — Ambulatory Visit (HOSPITAL_COMMUNITY): Payer: Medicaid Other

## 2019-08-11 ENCOUNTER — Inpatient Hospital Stay: Payer: Medicaid Other

## 2019-08-11 ENCOUNTER — Other Ambulatory Visit: Payer: Self-pay

## 2019-08-11 DIAGNOSIS — C8190 Hodgkin lymphoma, unspecified, unspecified site: Secondary | ICD-10-CM

## 2019-08-11 DIAGNOSIS — D509 Iron deficiency anemia, unspecified: Secondary | ICD-10-CM | POA: Diagnosis not present

## 2019-08-11 DIAGNOSIS — E538 Deficiency of other specified B group vitamins: Secondary | ICD-10-CM | POA: Diagnosis not present

## 2019-08-11 DIAGNOSIS — Z7901 Long term (current) use of anticoagulants: Secondary | ICD-10-CM | POA: Diagnosis not present

## 2019-08-11 DIAGNOSIS — Z79899 Other long term (current) drug therapy: Secondary | ICD-10-CM | POA: Diagnosis not present

## 2019-08-11 DIAGNOSIS — C8198 Hodgkin lymphoma, unspecified, lymph nodes of multiple sites: Secondary | ICD-10-CM | POA: Diagnosis not present

## 2019-08-11 LAB — CBC WITH DIFFERENTIAL/PLATELET
Abs Immature Granulocytes: 0 10*3/uL (ref 0.00–0.07)
Basophils Absolute: 0 10*3/uL (ref 0.0–0.1)
Basophils Relative: 0 %
Eosinophils Absolute: 0 10*3/uL (ref 0.0–0.5)
Eosinophils Relative: 1 %
HCT: 38.4 % (ref 36.0–46.0)
Hemoglobin: 12.5 g/dL (ref 12.0–15.0)
Immature Granulocytes: 0 %
Lymphocytes Relative: 28 %
Lymphs Abs: 0.9 10*3/uL (ref 0.7–4.0)
MCH: 29.4 pg (ref 26.0–34.0)
MCHC: 32.6 g/dL (ref 30.0–36.0)
MCV: 90.4 fL (ref 80.0–100.0)
Monocytes Absolute: 0.2 10*3/uL (ref 0.1–1.0)
Monocytes Relative: 7 %
Neutro Abs: 2 10*3/uL (ref 1.7–7.7)
Neutrophils Relative %: 64 %
Platelets: 308 10*3/uL (ref 150–400)
RBC: 4.25 MIL/uL (ref 3.87–5.11)
RDW: 15 % (ref 11.5–15.5)
WBC: 3.2 10*3/uL — ABNORMAL LOW (ref 4.0–10.5)
nRBC: 0 % (ref 0.0–0.2)

## 2019-08-11 LAB — CMP (CANCER CENTER ONLY)
ALT: 15 U/L (ref 0–44)
AST: 16 U/L (ref 15–41)
Albumin: 3.7 g/dL (ref 3.5–5.0)
Alkaline Phosphatase: 91 U/L (ref 38–126)
Anion gap: 10 (ref 5–15)
BUN: 27 mg/dL — ABNORMAL HIGH (ref 6–20)
CO2: 26 mmol/L (ref 22–32)
Calcium: 9.1 mg/dL (ref 8.9–10.3)
Chloride: 107 mmol/L (ref 98–111)
Creatinine: 0.72 mg/dL (ref 0.44–1.00)
GFR, Est AFR Am: >60 mL/min (ref >60)
GFR, Estimated: >60 mL/min (ref >60)
Glucose, Bld: 97 mg/dL (ref 70–99)
Potassium: 4 mmol/L (ref 3.5–5.1)
Sodium: 143 mmol/L (ref 135–145)
Total Bilirubin: 0.3 mg/dL (ref 0.3–1.2)
Total Protein: 7.1 g/dL (ref 6.5–8.1)

## 2019-08-11 LAB — MAGNESIUM: Magnesium: 1.8 mg/dL (ref 1.7–2.4)

## 2019-08-13 IMAGING — XA IR PICC >5YO
1 series · 1 of 1 positions shown · IV contrast (agent unspecified)
Comparison: none

Addendum:
INDICATION: Patient in need of venous access for chemotherapy administration.
Has a short midline access in place. Request made for exchange for
PICC as SVC compression improving after treatment.

EXAM:
ULTRASOUND AND FLUOROSCOPIC GUIDED PICC LINE INSERTION
MEDICATIONS:
1 mL 1% lidocaine
CONTRAST:  None
FLUOROSCOPY TIME:  30 seconds
COMPLICATIONS:
None immediate.
TECHNIQUE: The procedure, risks, benefits, and alternatives were explained to
the patient and informed written consent was obtained. A timeout was
performed prior to the initiation of the procedure.

[Series 300: line placements · 1 of 1 slices shown]
[im 1/1]
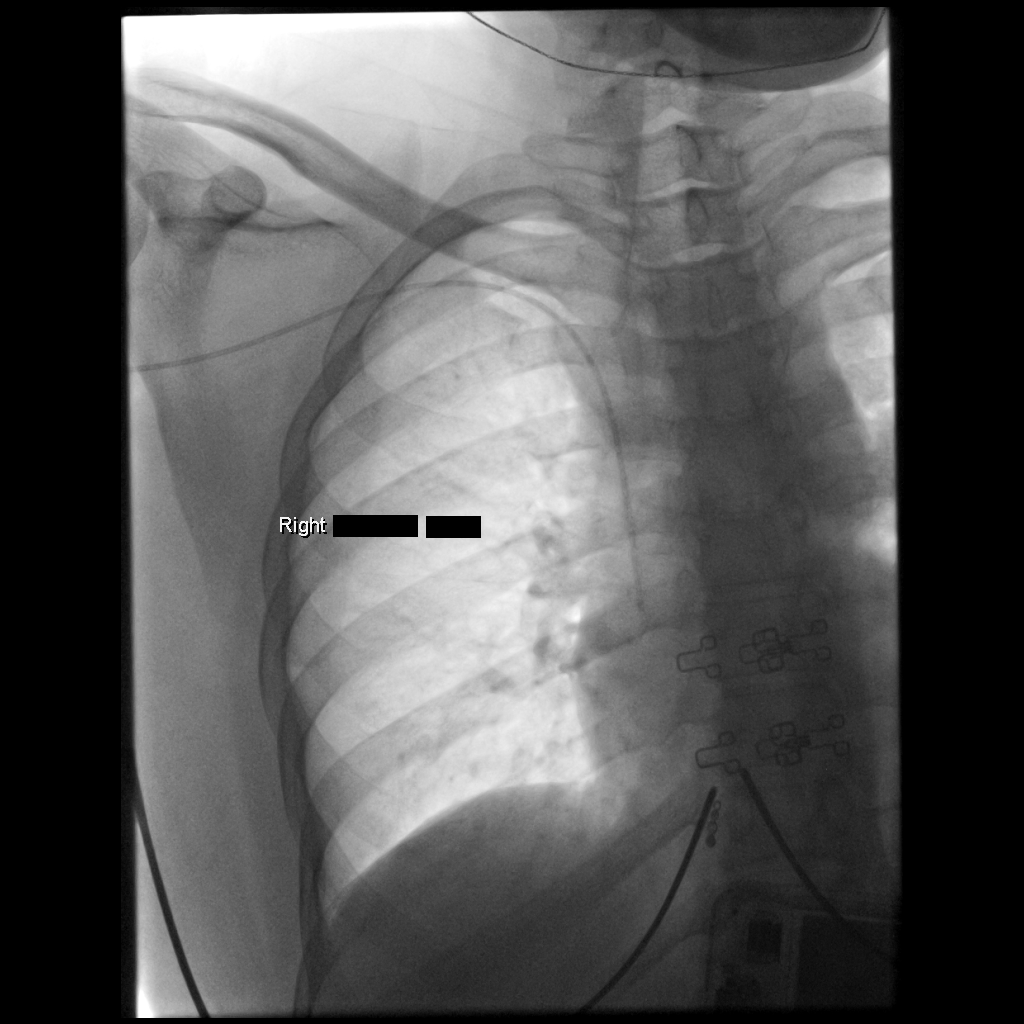

[1 of 1 positions shown; findings below may reference images not displayed]

The right upper extremity was prepped with chlorhexidine in a
sterile fashion, and a sterile drape was applied covering the
operative field. Maximum barrier sterile technique with sterile
gowns and gloves were used for the procedure. A timeout was
performed prior to the initiation of the procedure. Local anesthesia
was provided with 1% lidocaine.

A guidewire was advanced through the existing midline to the level
of the superior caval-atrial junction for measurement purposes and
the PICC line was cut to length. A peel-away sheath was placed and a
40 cm, 5 French, dual lumen was inserted to level of the superior
caval-atrial junction. A post procedure spot fluoroscopic was
obtained. The catheter easily aspirated and flushed and was sutured
in place. A dressing was placed. The patient tolerated the procedure
well without immediate post procedural complication.
FINDINGS: After catheter placement, the tip lies within the superior
cavoatrial junction. The catheter aspirates and flushes normally and
is ready for immediate use.
IMPRESSION: Successful fluoroscopic guided placement of a right basilic vein
approach, 40 cm, 5 French, dual lumen PICC with tip at the superior
caval-atrial junction. The PICC line is ready for immediate use.

ADDENDUM:
Ultrasound was utilized to confirm patency of the right basilic
vein. Ultrasound image documentation was performed.

*** End of Addendum ***

## 2019-08-16 ENCOUNTER — Ambulatory Visit (HOSPITAL_COMMUNITY): Payer: Medicaid Other

## 2019-08-17 ENCOUNTER — Telehealth: Payer: Self-pay | Admitting: Hematology

## 2019-08-17 ENCOUNTER — Other Ambulatory Visit: Payer: Self-pay | Admitting: Hematology

## 2019-08-17 ENCOUNTER — Inpatient Hospital Stay: Payer: Medicaid Other | Admitting: Hematology

## 2019-08-17 DIAGNOSIS — C8198 Hodgkin lymphoma, unspecified, lymph nodes of multiple sites: Secondary | ICD-10-CM

## 2019-08-17 NOTE — Telephone Encounter (Signed)
Called Ms. Cassandra Clark and discussed her CT chest abdomen pelvis findings.  PET scan denied by the insurance company. Discussed importance of repeat tissue sampling.  Will attempt ultrasound-guided core needle biopsy of her left axillary lymph node that appears to be increasing in size.  If this is nondiagnostic might need to consider IR or surgical biopsy of one of her enlarging mediastinal lymph nodes. Patient is agreeable with this plan.  Ultrasound-guided left axilla lymph node biopsy has been requested.

## 2019-08-22 ENCOUNTER — Encounter (HOSPITAL_COMMUNITY): Payer: Self-pay

## 2019-08-22 NOTE — Progress Notes (Signed)
[name], 38 y.o., 1981/01/26 MRN:  [id number] Phone:  [phone] Cassandra Clark) PCP:  Patient, No Pcp Per Coverage:  Medicaid [place]/Medicaid [place] Access  RE: Biopsy Received: 4 days ago Message Contents  Greggory Keen, MD  [facility] E  Getting repeat PET first. Hold until PET done   Ts   Previous Messages  ----- Message -----  From: Lenore Cordia  Sent: 08/18/2019  8:39 AM EDT  To: Ir Procedure Requests  Subject: Biopsy                      Procedure Requested: US guided core needle biopsy of left axillary LN     Reason for Procedure: patient with a h/o treated hodgkins lymphoma with increasing left axillary and mediastinal LNadenopathy-- requesting   Provider Requesting: Brunetta Genera  Provider Telephone: [phone]    Other Info: Rad info in Epic

## 2019-08-26 ENCOUNTER — Encounter (HOSPITAL_COMMUNITY): Payer: Self-pay

## 2019-08-26 NOTE — Progress Notes (Signed)
Cassandra Clark Female, 38 y.o., 02/19/1981 MRN:  [id number] Phone:  [phone] [name]) PCP:  Patient, No Pcp Per Coverage:  Medicaid [place]/Medicaid [place] Access Next Appt With Radiology ([facility]) 08/30/2019 at 1:00 PM  RE: Biopsy Received: 2 days ago Message Contents  Greggory Keen, MD  Anastasia Fiedler, MD  Attempt [place] bx left axillary node. Nodes are still small and she is obese. Sampling may be challenging and non DX   PET was denied   TS   Previous Messages  ----- Message -----  From: Lenore Cordia  Sent: 08/23/2019  3:09 PM EDT  To: Greggory Keen, MD  Subject: FW: Biopsy                    Please see Dr Grier Mitts comments.  ----- Message -----  From: Brunetta Genera, MD  Sent: 08/23/2019 11:22 AM EDT  To: Christin Fudge, RN  Subject: RE: Biopsy                    PET/CT was denied by insurance -- will hope to proceed with Biopsy.  Thx  GK  ----- Message -----  From: Lenore Cordia  Sent: 08/22/2019  1:31 PM EDT  To: Rolland Bimler, RN, Brunetta Genera, MD  Subject: Melton Alar: Biopsy                     ----- Message -----  From: Greggory Keen, MD  Sent: 08/18/2019  9:11 AM EDT  To: Lenore Cordia  Subject: RE: Biopsy                    Getting repeat PET first. Hold until PET done   Ts   ----- Message -----  From: Lenore Cordia  Sent: 08/18/2019  8:39 AM EDT  To: Ir Procedure Requests  Subject: Biopsy                      Procedure Requested: US guided core needle biopsy of left axillary LN     Reason for Procedure: patient with a h/o treated hodgkins lymphoma with increasing left axillary and mediastinal LNadenopathy-- requesting   Provider Requesting: Brunetta Genera  Provider Telephone: [phone]    Other Info: Rad info in Epic

## 2019-08-28 ENCOUNTER — Other Ambulatory Visit: Payer: Self-pay | Admitting: Radiology

## 2019-08-29 ENCOUNTER — Other Ambulatory Visit: Payer: Self-pay | Admitting: Radiology

## 2019-08-30 ENCOUNTER — Encounter (HOSPITAL_COMMUNITY): Payer: Self-pay

## 2019-08-30 ENCOUNTER — Ambulatory Visit (HOSPITAL_COMMUNITY)
Admission: RE | Admit: 2019-08-30 | Discharge: 2019-08-30 | Disposition: A | Discharge status: Home / Self Care | Payer: Medicaid Other | Source: Ambulatory Visit | Attending: Hematology | Admitting: Hematology

## 2019-08-30 ENCOUNTER — Ambulatory Visit (HOSPITAL_COMMUNITY)
Admission: RE | Admit: 2019-08-30 | Discharge: 2019-08-30 | Disposition: A | Discharge status: Home / Self Care | Payer: Medicaid Other | Source: Ambulatory Visit

## 2019-08-30 ENCOUNTER — Other Ambulatory Visit: Payer: Self-pay

## 2019-08-30 DIAGNOSIS — R599 Enlarged lymph nodes, unspecified: Secondary | ICD-10-CM | POA: Diagnosis not present

## 2019-08-30 DIAGNOSIS — C8198 Hodgkin lymphoma, unspecified, lymph nodes of multiple sites: Secondary | ICD-10-CM | POA: Insufficient documentation

## 2019-08-30 DIAGNOSIS — N83292 Other ovarian cyst, left side: Secondary | ICD-10-CM | POA: Insufficient documentation

## 2019-08-30 DIAGNOSIS — Z79899 Other long term (current) drug therapy: Secondary | ICD-10-CM | POA: Insufficient documentation

## 2019-08-30 HISTORY — DX: Malignant (primary) neoplasm, unspecified: C80.1

## 2019-08-30 LAB — CBC
HCT: 39.8 % (ref 36.0–46.0)
Hemoglobin: 12.6 g/dL (ref 12.0–15.0)
MCH: 29.6 pg (ref 26.0–34.0)
MCHC: 31.7 g/dL (ref 30.0–36.0)
MCV: 93.4 fL (ref 80.0–100.0)
Platelets: 320 10*3/uL (ref 150–400)
RBC: 4.26 MIL/uL (ref 3.87–5.11)
RDW: 14.7 % (ref 11.5–15.5)
WBC: 4.9 10*3/uL (ref 4.0–10.5)
nRBC: 0 % (ref 0.0–0.2)

## 2019-08-30 LAB — PROTIME-INR
INR: 1.1 (ref 0.8–1.2)
Prothrombin Time: 14.1 seconds (ref 11.4–15.2)

## 2019-08-30 MED ORDER — MIDAZOLAM HCL 2 MG/2ML IJ SOLN
INTRAMUSCULAR | Status: AC | PRN
  Administered 2019-08-30 (×2): 1 mg via INTRAVENOUS

## 2019-08-30 MED ORDER — FENTANYL CITRATE (PF) 100 MCG/2ML IJ SOLN
INTRAMUSCULAR | Status: AC
  Filled 2019-08-30: qty 2

## 2019-08-30 MED ORDER — LIDOCAINE-EPINEPHRINE (PF) 2 %-1:200000 IJ SOLN
INTRAMUSCULAR | Status: AC | PRN
  Administered 2019-08-30: 5 mL
  Administered 2019-08-30: 10 mL

## 2019-08-30 MED ORDER — FENTANYL CITRATE (PF) 100 MCG/2ML IJ SOLN
INTRAMUSCULAR | Status: AC | PRN
  Administered 2019-08-30: 50 ug via INTRAVENOUS

## 2019-08-30 MED ORDER — LIDOCAINE-EPINEPHRINE (PF) 2 %-1:200000 IJ SOLN
INTRAMUSCULAR | Status: AC
  Filled 2019-08-30: qty 20

## 2019-08-30 MED ORDER — MIDAZOLAM HCL 2 MG/2ML IJ SOLN
INTRAMUSCULAR | Status: AC
  Filled 2019-08-30: qty 2

## 2019-08-30 MED ORDER — SODIUM CHLORIDE 0.9 % IV SOLN
INTRAVENOUS | Status: DC
  Administered 2019-08-30: 12:00:00 via INTRAVENOUS

## 2019-08-30 NOTE — Discharge Instructions (Signed)
Needle Biopsy, Care After °These instructions tell you how to care for yourself after your procedure. Your doctor may also give you more specific instructions. Call your doctor if you have any problems or questions. °What can I expect after the procedure? °After the procedure, it is common to have: °· Soreness. °· Bruising. °· Mild pain. °Follow these instructions at home: ° °· Return to your normal activities as told by your doctor. Ask your doctor what activities are safe for you. °· Take over-the-counter and prescription medicines only as told by your doctor. °· Wash your hands with soap and water before you change your bandage (dressing). If you cannot use soap and water, use hand sanitizer. °· Follow instructions from your doctor about: °? How to take care of your puncture site. °? When and how to change your bandage. °? When to remove your bandage. °· Check your puncture site every day for signs of infection. Watch for: °? Redness, swelling, or pain. °? Fluid or blood.  °? Pus or a bad smell. °? Warmth. °· Do not take baths, swim, or use a hot tub until your doctor approves. Ask your doctor if you may take showers. You may only be allowed to take sponge baths. °· Keep all follow-up visits as told by your doctor. This is important. °Contact a doctor if you have: °· A fever. °· Redness, swelling, or pain at the puncture site, and it lasts longer than a few days. °· Fluid, blood, or pus coming from the puncture site. °· Warmth coming from the puncture site. °Get help right away if: °· You have a lot of bleeding from the puncture site. °Summary °· After the procedure, it is common to have soreness, bruising, or mild pain at the puncture site. °· Check your puncture site every day for signs of infection, such as redness, swelling, or pain. °· Get help right away if you have severe bleeding from your puncture site. °This information is not intended to replace advice given to you by your health care provider. Make  sure you discuss any questions you have with your health care provider. °Document Released: 09/25/2008 Document Revised: 10/26/2017 Document Reviewed: 10/26/2017 °Elsevier Patient Education © 2020 [employer] Inc[employer] °Moderate Conscious Sedation, Adult, Care After °These instructions provide you with information about caring for yourself after your procedure. Your health care provider may also give you more specific instructions. Your treatment has been planned according to current medical practices, but problems sometimes occur. Call your health care provider if you have any problems or questions after your procedure. °What can I expect after the procedure? °After your procedure, it is common: °· To feel sleepy for several hours. °· To feel clumsy and have poor balance for several hours. °· To have poor judgment for several hours. °· To vomit if you eat too soon. °Follow these instructions at home: °For at least 24 hours after the procedure: ° °· Do not: °? Participate in activities where you could fall or become injured. °? Drive. °? Use heavy machinery. °? Drink alcohol. °? Take sleeping pills or medicines that cause drowsiness. °? Make important decisions or sign legal documents. °? Take care of children on your own. °· Rest. °Eating and drinking °· Follow the diet recommended by your health care provider. °· If you vomit: °? Drink water, juice, or soup when you can drink without vomiting. °? Make sure you have little or no nausea before eating solid foods. °General instructions °· Have a responsible adult stay with   you until you are awake and alert. °· Take over-the-counter and prescription medicines only as told by your health care provider. °· If you smoke, do not smoke without supervision. °· Keep all follow-up visits as told by your health care provider. This is important. °Contact a health care provider if: °· You keep feeling nauseous or you keep vomiting. °· You feel light-headed. °· You develop a rash. °· You  have a fever. °Get help right away if: °· You have trouble breathing. °This information is not intended to replace advice given to you by your health care provider. Make sure you discuss any questions you have with your health care provider. °Document Released: 08/03/2013 Document Revised: 09/25/2017 Document Reviewed: 02/02/2016 °Elsevier Patient Education © 2020 Elsevier Inc. ° °

## 2019-08-30 NOTE — Procedures (Signed)
Pre Procedure Dx: Left axillary lymph adenopathy Post Procedural Dx: Same  Technically successful US guided biopsy of left axillary lymph node  EBL: None  No immediate complications.   Jay Crockett Rallo, MD Pager #: [phone]    

## 2019-08-30 NOTE — H&P (Signed)
Referring Physician(s): Brunetta Genera  Supervising Physician: Sandi Mariscal  Patient Status:  [facility] OP  Chief Complaint: "I'm having a biopsy"   Subjective: Patient familiar to IR service from left supraclavicular lymph node biopsy in 2019, bone marrow biopsy in 2019, and PICC placement in 2019.  She has a history of Hodgkin's lymphoma and is status post chemotherapy.  Patient's latest CT scan done 07/28/19 reveals :  1. There has been interval decrease in thickness and fullness of anterior mediastinal and AP window soft tissue, measuring 3.7 x 1.2 cm, previously 4.0 x 1.5 cm when measured similarly (series 2, image 17).  2. There is new soft tissue along the inner table of the left aspect of the manubrium measuring 2.1 x 1.3 cm with subtle adjacent erosion and sclerosis of the bone (series 2, image 15). Adjacent anterior mediastinal lymph node appears newly enlarged compared to prior examination although was very difficult to discretely appreciate on prior due to soft tissue stranding, measuring approximately 1.3 x 1.0 cm (series 2, image 14). A left axillary lymph node is slightly enlarged and more full, measuring 1.6 x 0.9 cm, previously 1.4 x 0.7 cm (series 2, image 17). These findings are in keeping with abnormal FDG avidity identified on prior PET-CT and consistent with recurrent disease.  3. Remaining small axillary and subpectoral lymph nodes are unchanged. No abnormally enlarged lymph nodes in the abdomen or pelvis.  4.  Large left ovarian dermoid  She presents today for image guided left axillary lymph node biopsy for further evaluation.  She currently denies fever, headache, chest pain, dyspnea, abdominal/back pain, nausea, vomiting or bleeding.  She does have occasional cough.  Past Medical History:  Diagnosis Date  . Anemia   . Cancer ([place])    hodgkins lymphoma   Past Surgical History:  Procedure Laterality Date  . PICC LINE INSERTION Right  08/18/2018     Allergies: Penicillins  Medications: Prior to Admission medications   Medication Sig Start Date End Date Taking? Authorizing Provider  B Complex-C (B-COMPLEX WITH VITAMIN C) tablet Take 1 tablet by mouth daily. 09/20/18  Yes Brunetta Genera, MD  Cyanocobalamin (B-12) 1000 MCG SUBL Place 2,000 mcg under the tongue daily. 12/27/18  Yes Brunetta Genera, MD  loratadine (CLARITIN) 10 MG tablet Take 10 mg by mouth daily.   Yes [provider]  acetaminophen (TYLENOL) 325 MG tablet Take 2 tablets (650 mg total) by mouth every 6 (six) hours as needed for mild pain, fever or headache. Patient not taking: Reported on 03/09/2019 10/19/18   Roxan Hockey, MD  albuterol (PROVENTIL) (2.5 MG/3ML) 0.083% nebulizer solution Take 3 mLs (2.5 mg total) by nebulization every 6 (six) hours as needed for wheezing or shortness of breath. Patient not taking: Reported on 03/09/2019 10/19/18   Roxan Hockey, MD  dexamethasone (DECADRON) 4 MG tablet Take 2 tablets by mouth once a day on the day after chemotherapy and then take 2 tablets two times a day for 2 days. Take with food. Patient not taking: Reported on 03/09/2019 12/27/18   Brunetta Genera, MD  ferrous sulfate 325 (65 FE) MG tablet Take 1 tablet (325 mg total) by mouth daily. Patient not taking: Reported on 08/23/2018 08/06/18   Raiford Noble Latif, DO  guaiFENesin (MUCINEX) 600 MG 12 hr tablet Take 1 tablet (600 mg total) by mouth 2 (two) times daily. Patient not taking: Reported on 11/01/2018 10/19/18   Roxan Hockey, MD  HYDROcodone-acetaminophen (NORCO) 5-325 MG tablet Take  1-2 tablets by mouth every 6 (six) hours as needed for moderate pain or severe pain. Patient not taking: Reported on 03/09/2019 11/02/18   Brunetta Genera, MD  lidocaine-prilocaine (EMLA) cream Apply to affected area once Patient not taking: Reported on 03/09/2019 08/09/18   Brunetta Genera, MD  LORazepam (ATIVAN) 0.5 MG tablet Take 1  tablet (0.5 mg total) by mouth every 6 (six) hours as needed (Nausea or vomiting). Patient not taking: Reported on 03/09/2019 08/09/18   Brunetta Genera, MD  nystatin (MYCOSTATIN) 100000 UNIT/ML suspension Use as directed 5 mLs (500,000 Units total) in the mouth or throat 4 (four) times daily. Patient not taking: Reported on 11/01/2018 10/19/18   Roxan Hockey, MD  ondansetron (ZOFRAN) 8 MG tablet Take 1 tablet (8 mg total) by mouth 2 (two) times daily as needed. Start on the third day after chemotherapy. Patient not taking: Reported on 03/09/2019 08/09/18   Brunetta Genera, MD  prochlorperazine (COMPAZINE) 10 MG tablet Take 1 tablet (10 mg total) by mouth every 6 (six) hours as needed (Nausea or vomiting). Patient not taking: Reported on 03/09/2019 08/09/18   Brunetta Genera, MD  rivaroxaban (XARELTO) 10 MG TABS tablet Take 1 tablet (10 mg total) by mouth daily. Patient not taking: Reported on 03/09/2019 01/10/19   Brunetta Genera, MD     Vital Signs: Pressure 115/76, temp 98.5, heart rate 91, respirations 16, O2 sat 100% room air LMP 07/29/2018   Physical Exam awake, alert.  Chest clear to auscultation bilaterally.  Heart with regular rate and rhythm.  Abdomen soft, positive bowel sounds, nontender.  No lower extremity edema.  Imaging: No results found.  Labs:  CBC: Recent Labs    03/07/19 1035 05/30/19 1342 08/11/19 1221 08/30/19 1153  WBC 4.1 2.9* 3.2* 4.9  HGB 11.3* 12.6 12.5 12.6  HCT 36.9 39.4 38.4 39.8  PLT 347 301 308 320    COAGS: Recent Labs    10/16/18 1310 08/30/19 1153  INR 1.31 1.1    BMP: Recent Labs    01/24/19 0851 03/07/19 1035 05/30/19 1342 08/11/19 1221  NA 140 141 142 143  K 3.6 4.3 4.0 4.0  CL 104 106 107 107  CO2 24 26 28 26   GLUCOSE 257* 99 95 97  BUN 9 17 21* 27*  CALCIUM 8.5* 9.3 9.7 9.1  CREATININE 0.73 0.77 0.76 0.72  GFRNONAA >60 >60 >60 >60  GFRAA >60 >60 >60 >60    LIVER FUNCTION TESTS: Recent Labs     01/24/19 0851 03/07/19 1035 05/30/19 1342 08/11/19 1221  BILITOT 0.2* 0.2* 0.3 0.3  AST 36 21 18 16   ALT 109* 23 15 15   ALKPHOS 131* 124 92 91  PROT 6.7 7.1 7.4 7.1  ALBUMIN 3.6 3.6 3.9 3.7    Assessment and Plan: Pt with history of Hodgkin's lymphoma 2019 ; status post chemotherapy. Latest restaging CT scan done 07/28/19 reveals :  1. There has been interval decrease in thickness and fullness of anterior mediastinal and AP window soft tissue, measuring 3.7 x 1.2 cm, previously 4.0 x 1.5 cm when measured similarly (series 2, image 17).  2. There is new soft tissue along the inner table of the left aspect of the manubrium measuring 2.1 x 1.3 cm with subtle adjacent erosion and sclerosis of the bone (series 2, image 15). Adjacent anterior mediastinal lymph node appears newly enlarged compared to prior examination although was very difficult to discretely appreciate on prior due to soft tissue stranding,  measuring approximately 1.3 x 1.0 cm (series 2, image 14). A left axillary lymph node is slightly enlarged and more full, measuring 1.6 x 0.9 cm, previously 1.4 x 0.7 cm (series 2, image 17). These findings are in keeping with abnormal FDG avidity identified on prior PET-CT and consistent with recurrent disease.  3. Remaining small axillary and subpectoral lymph nodes are unchanged. No abnormally enlarged lymph nodes in the abdomen or pelvis.  4.  Large left ovarian dermoid  She presents today for image guided left axillary lymph node biopsy for further evaluation.Risks and benefits of procedure was discussed with the patient  including, but not limited to bleeding, infection, damage to adjacent structures , low yield requiring additional tests or inability to biopsy lymph node due to small size.  All of the questions were answered and there is agreement to proceed.  Consent signed and in chart.     Electronically Signed: D. Rowe Robert, PA-C 08/30/2019, 12:21 PM    I spent a total of 20 minutes at the the patient's bedside AND on the patient's hospital floor or unit, greater than 50% of which was counseling/coordinating care for image guided left axillary lymph node biopsy

## 2019-09-01 LAB — SURGICAL PATHOLOGY

## 2019-09-06 ENCOUNTER — Encounter: Payer: Self-pay | Admitting: Hematology

## 2019-09-07 ENCOUNTER — Other Ambulatory Visit: Payer: Self-pay

## 2019-09-07 ENCOUNTER — Inpatient Hospital Stay: Payer: Medicaid Other | Attending: Hematology | Admitting: Hematology

## 2019-09-07 VITALS — BP 109/55 | HR 84 | Temp 98.2°F | Resp 18 | Ht 67.0 in | Wt 176.2 lb

## 2019-09-07 DIAGNOSIS — C8198 Hodgkin lymphoma, unspecified, lymph nodes of multiple sites: Secondary | ICD-10-CM | POA: Insufficient documentation

## 2019-09-07 DIAGNOSIS — Z79899 Other long term (current) drug therapy: Secondary | ICD-10-CM | POA: Insufficient documentation

## 2019-09-07 DIAGNOSIS — D510 Vitamin B12 deficiency anemia due to intrinsic factor deficiency: Secondary | ICD-10-CM | POA: Diagnosis not present

## 2019-09-07 NOTE — Progress Notes (Signed)
HEMATOLOGY/ONCOLOGY CLINIC NOTE  Date of Service: 09/07/2019  Patient Care Team: Patient, No Pcp Per as PCP - General (General Practice)  CHIEF COMPLAINTS/PURPOSE OF CONSULTATION:  F/u for Mx of Hodgkins lymphoma  HISTORY OF PRESENTING ILLNESS:   Cassandra Clark is a wonderful 38 y.o. female who has been referred to [place] by Dr. 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   Patient has a mild anemia and presented to the emergency room with new onset fevers chills and a 2-week history of worsening night sweats dry cough fatigue and anorexia.  Patient notes that she initially started feeling unwell from late June when she had noted increasing fatigue that required her to rest for longer periods of time.  She also noted that she at times felt lightheaded and dizzy when she bent forward.  She notes a decreased appetite.  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 She notes that she has not been able to do much of anything else other than rest at home and work to some degree at her job.  No chest pain no palpitations no diaphoresis no lower extremity edema.  No abdominal pain no nausea no vomiting no diarrhea.  No rectal bleeding. Notes that her periods have been regular.  In the emergency room the patient was noted to have a fever of 102.4 F and tachycardia related to this.  Blood counts showed that the patient was anemic with a hemoglobin around 8 with normal platelets and a WBC count of 3.6k.  Magnesium 1.9 and phosphorus of 2. She was also noted to be vitamin B12 deficient.  Most recent lab results (08/04/18) of CBC w/diff and CMP is as follows: all values are WNL except for WBC at 3.0k RBC at 3.64, HGB at 8.3, HCT at 82.7, MCV at 78.8, MCH at 22.8, MCHC at 28.9, RDW at 18.1, PLT at 459k, Lymphs abs at 300, Calcium at  8.0, Albumin at 2.7. 08/04/18 LDH at 203 08/04/18 Vitamin B12 at 135  X-ray of the chest done on 08/03/2018 showed Right paratracheal and left AP window masses likely representing lymphadenopathy. Consider CT chest for further characterization.  CT of the chest with contrast was subsequently done which showed 1. 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 In addition there are multiple nodular lesions in the spleen and possibly in the liver, as well as permeative destruction of the sternal manubrium with tumor rind anterior and posterior to the sternum. 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 Left-sided small cell lung cancer might give a similar appearance. Aggressive germ-cell tumor or thymic carcinoma or less likely differential diagnostic considerations. Tissue diagnosis recommended. 2. The anterior mediastinal mass is causing narrowing of the SVC, and mild invasion of the SVC with slight marginal thrombosis is not readily excluded. No large pulmonary embolus is identified.  Patient had additional lab testing which showed a near normal LDH was 203. Sedimentation rate was done subsequently and was noted to be elevated.  Urine pregnancy test was within normal limits.  Patient was noted to have significant headaches over the last 4 to 6 weeks and had a CT of the head without contrast in the ED on 07/04/2018 which showed  no acute intracranial normalities.  On review of systems, pt reports fevers, unquantified weight loss, shortness of breath, night sweats and denies overt chest pain, rashes.   Interval History:  Cassandra Clark returns today for management, evaluation after completing 6 planned cycles of her A-AVD treatment of her Hodgkin's Lymphoma. The patient's last visit with [place] was on 08/03/2019. The pt reports that she is  doing well overall.  The pt reports no issues with her recent lymph node biopsy. She has had no new concerns in the interim and has actually felt better in the last few months. She is close to her starting weight and has felt more energetic.   Of note since the patient's last visit, pt has had [place] Axillary LN Bx ([id number])  completed on 08/30/2019 with results revealing "LYMPH NODE, LEFT AXILLARY, BIOPSY: Classical Hodgkin lymphoma."  Lab results (08/30/19) of CBC is as follows: all values are WNL.   On review of systems, pt reports eating well and denies fatigue, unexpected weight loss and any other symptoms.   MEDICAL HISTORY:  Past Medical History:  Diagnosis Date   Anemia    Cancer ([place])    hodgkins lymphoma    SURGICAL HISTORY: Past Surgical History:  Procedure Laterality Date   PICC LINE INSERTION Right 08/18/2018    SOCIAL HISTORY: Social History   Socioeconomic History   Marital status: Legally Separated    Spouse name: Not on file   Number of children: 2   Years of education: Not on file   Highest education level: Not on file  Occupational History    Comment: no presently working  Scientist, product/process development strain: Not on file   Food insecurity    Worry: Not on file    Inability: Not on file   Transportation needs    Medical: Not on file    Non-medical: Not on file  Tobacco Use   Smoking status: Never Smoker   Smokeless tobacco: Never Used  Substance and Sexual Activity   Alcohol use: No   Drug use: No    Comment: Drinks tea   Sexual activity: Yes  Lifestyle   Physical activity    Days per week: Not on file    Minutes per session: Not on file   Stress: Not on file  Relationships   Social connections    Talks on phone: Not on file    Gets together: Not on file    Attends religious service: Not on file    Active member of club or organization: Not on file    Attends meetings of clubs or organizations: Not on file     Relationship status: Not on file   Intimate partner violence    Fear of current or ex partner: Not on file    Emotionally abused: Not on file    Physically abused: Not on file    Forced sexual activity: Not on file  Other Topics Concern   Not on file  Social History Narrative   Not on file    FAMILY HISTORY: Family History  Problem Relation Age of Onset   Diabetes Mellitus II Father    Hypertension Father    Congestive Heart Failure Maternal Grandmother    Diabetes Mellitus II Maternal Grandmother    Diabetes Mellitus II Maternal Uncle    Congestive Heart Failure Maternal Uncle    Cancer Neg Hx     ALLERGIES:  is allergic to penicillins.  MEDICATIONS:  Current Outpatient Medications  Medication Sig Dispense Refill   acetaminophen (TYLENOL) 325 MG tablet Take 2 tablets (650 mg total) by mouth every 6 (six) hours as needed for mild pain, fever or headache. (Patient not taking: Reported on 03/09/2019) 30 tablet 1   albuterol (PROVENTIL) (2.5 MG/3ML) 0.083% nebulizer solution Take 3 mLs (2.5 mg total) by nebulization every 6 (six) hours as needed for wheezing or shortness of breath. (Patient not taking: Reported on 03/09/2019) 75 mL 12   B Complex-C (B-COMPLEX WITH VITAMIN C) tablet Take 1 tablet by mouth daily. 30 tablet 3   Cyanocobalamin (B-12) 1000 MCG SUBL Place 2,000 mcg under the tongue daily. 60 each 3   dexamethasone (DECADRON) 4 MG tablet Take 2 tablets by mouth once a day on the day after chemotherapy and then take 2 tablets two times a day for 2 days. Take with food. (Patient not taking: Reported on 03/09/2019) 30 tablet 1   ferrous sulfate 325 (65 FE) MG tablet Take 1 tablet (325 mg total) by mouth daily. (Patient not taking: Reported on 08/23/2018) 14 tablet 0   guaiFENesin (MUCINEX) 600 MG 12 hr tablet Take 1 tablet (600 mg total) by mouth 2 (two) times daily. (Patient not taking: Reported on 11/01/2018) 20 tablet o   HYDROcodone-acetaminophen (NORCO) 5-325  MG tablet Take 1-2 tablets by mouth every 6 (six) hours as needed for moderate pain or severe pain. (Patient not taking: Reported on 03/09/2019) 30 tablet 0   lidocaine-prilocaine (EMLA) cream Apply to affected area once (Patient not taking: Reported on 03/09/2019) 30 g 3   loratadine (CLARITIN) 10 MG tablet Take 10 mg by mouth daily.     LORazepam (ATIVAN) 0.5 MG tablet Take 1 tablet (0.5 mg total) by mouth every 6 (six) hours as needed (Nausea or vomiting). (Patient not taking: Reported on 03/09/2019) 30 tablet 0   nystatin (MYCOSTATIN) 100000 UNIT/ML suspension Use as directed 5 mLs (500,000 Units total) in the mouth or throat 4 (four) times daily. (Patient not taking: Reported on 11/01/2018) 60 mL 0   ondansetron (ZOFRAN) 8 MG tablet Take 1 tablet (8 mg total) by mouth 2 (two) times daily as needed. Start on the third day after chemotherapy. (Patient not taking: Reported on 03/09/2019) 30 tablet 1   prochlorperazine (COMPAZINE) 10 MG tablet Take 1 tablet (10 mg total) by mouth every 6 (six) hours as needed (Nausea or vomiting). (Patient not taking: Reported on 03/09/2019) 30 tablet 1   rivaroxaban (XARELTO) 10 MG TABS tablet Take 1 tablet (10 mg total) by mouth daily. (Patient not taking: Reported on 03/09/2019) 30 tablet 1   No current facility-administered medications for this visit.     REVIEW OF SYSTEMS:   A 10+ POINT REVIEW OF SYSTEMS WAS OBTAINED including neurology, dermatology, psychiatry, cardiac, respiratory, lymph, extremities, GI, GU, Musculoskeletal, constitutional, breasts, reproductive, HEENT.  All pertinent positives are noted in the HPI.  All others are negative.   PHYSICAL EXAMINATION:  ECOG PERFORMANCE STATUS: 1 - Symptomatic but completely ambulatory  Vitals:   09/07/19 1541  BP: (!) 109/55  Pulse: 84  Resp: 18  Temp: 98.2 F (36.8 C)  SpO2: 100%   Filed Weights   09/07/19 1541  Weight: 176 lb 3.2 oz (79.9 kg)   .Body mass index is 27.6 kg/m.  GENERAL:alert,  in no acute distress and comfortable SKIN: no acute rashes, no significant lesions EYES: conjunctiva are pink and non-injected, sclera anicteric OROPHARYNX: MMM, no exudates, no oropharyngeal erythema or ulceration NECK: supple, no JVD LYMPH:  no palpable lymphadenopathy in the cervical, axillary or inguinal regions LUNGS: clear to auscultation b/l with normal respiratory effort HEART: regular rate & rhythm ABDOMEN:  normoactive bowel sounds , non tender, not distended. No palpable hepatosplenomegaly.  Extremity: no pedal edema PSYCH: alert & oriented x 3 with fluent speech NEURO: no focal motor/sensory deficits  LABORATORY DATA:  I have reviewed the data as listed  CBC Latest Ref Rng & Units 08/30/2019 08/11/2019 05/30/2019  WBC 4.0 - 10.5 K/uL 4.9 3.2(L) 2.9(L)  Hemoglobin 12.0 - 15.0 g/dL 12.6 12.5 12.6  Hematocrit 36.0 - 46.0 % 39.8 38.4 39.4  Platelets 150 - 400 K/uL 320 308 301    . CMP Latest Ref Rng & Units 08/11/2019 05/30/2019 03/07/2019  Glucose 70 - 99 mg/dL 97 95 99  BUN 6 - 20 mg/dL 27(H) 21(H) 17  Creatinine 0.44 - 1.00 mg/dL 0.72 0.76 0.77  Sodium 135 - 145 mmol/L 143 142 141  Potassium 3.5 - 5.1 mmol/L 4.0 4.0 4.3  Chloride 98 - 111 mmol/L 107 107 106  CO2 22 - 32 mmol/L 26 28 26   Calcium 8.9 - 10.3 mg/dL 9.1 9.7 9.3  Total Protein 6.5 - 8.1 g/dL 7.1 7.4 7.1  Total Bilirubin 0.3 - 1.2 mg/dL 0.3 0.3 0.2(L)  Alkaline Phos 38 - 126 U/L 91 92 124  AST 15 - 41 U/L 16 18 21   ALT 0 - 44 U/L 15 15 23     08/04/18 Left Cervical Core Biopsy:   08/06/18 BM Bx:    08/30/2019 Left Axillary LN Bx ([id number]):   RADIOGRAPHIC STUDIES: I have personally reviewed the radiological images as listed and agreed with the findings in the report. [place] Axillary Node Core Biopsy Left  Result Date: 08/30/2019 INDICATION: History of Hodgkin's lymphoma now with indeterminate enlarging left axillary lymph node. Please from ultrasound-guided biopsy for tissue diagnostic purposes.  EXAM: ULTRASOUND-GUIDED LEFT AXILLARY LYMPH NODE BIOPSY COMPARISON:  CT the chest, abdomen and pelvis-07/28/2019; PET-CT-03/01/2019; 11/22/2018; 08/13/2028 MEDICATIONS: None ANESTHESIA/SEDATION: Moderate (conscious) sedation was employed during this procedure. A total of Versed 2 mg and Fentanyl 50 mcg was administered intravenously. Moderate Sedation Time: 10 minutes. The patient's level of consciousness and vital signs were monitored continuously by radiology nursing throughout the procedure under my direct supervision. COMPLICATIONS: None immediate. TECHNIQUE: Informed written consent was obtained from the patient after a discussion of the risks, benefits and alternatives to treatment. Questions regarding the procedure were encouraged and answered. Initial ultrasound scanning demonstrated an approximately 1.5 x 1.2 x 1.2 cm left axillary (image 4), correlating with the dominant lymph node seen on preceding chest CT performed 07/28/2019, image 15, series 2. an ultrasound image was saved for documentation purposes. The procedure was planned. A timeout was performed prior to the initiation of the procedure. The operative was prepped and draped in the usual sterile fashion, and a sterile drape was applied covering the operative field. A timeout was performed prior to the initiation of the procedure. Local anesthesia was provided with 1% lidocaine with epinephrine. Under direct ultrasound guidance, an 18 gauge core needle device was utilized to obtain to obtain 6 core needle biopsies of the dominant left axillary lymph node. The samples were placed in saline and submitted to pathology. The needle was removed and hemostasis was achieved with manual compression. Post procedure scan was negative for significant hematoma. A dressing was placed. The patient tolerated the procedure well without immediate postprocedural complication. IMPRESSION: Technically successful ultrasound guided biopsy of dominant left axillary lymph  node.  Electronically Signed   By: Sandi Mariscal M.D.   On: 08/30/2019 14:10    ASSESSMENT & PLAN:   38 y.o. 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 LDH - no significantly elevated. 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 With cytopenias concern for BM involvement with Hodgkins lymphoma  08/05/18 ECHO revealed LV EF of 65-70%. No pericardialeffusion.   08/06/18 BM Bx revealed involvement by Hodgkin's lymphoma  08/13/18 PET/CT revealed Large hypermetabolic mass within the anterior mediastinum identified compatible with lymphoma, Deauville criteria 5. There is extensive hypermetabolic left supraclavicular, left axillary, left hilar and right iliac adenopathy, Deauville criteria 4 and 5. Additional lesions are identified within the caudate lobe of liver and spleen, Deauville criteria 4. Multifocal hypermetabolic bone lesions are also identified, Deauville criteria 5   Pt began C1 AAVD on 08/09/18  Delayed C3D15 for pneumonia admission on 10/16/18 to 10/19/18  11/22/18 PET/CT revealed "Reduction in volume and no significant metabolic activity within anterior mediastinal nodes and LEFT axillary nodes ( Deauville 1). 2. No focal activity within the liver or spleen ( Deauville 1) 3. No new or progressive adenopathy. 4. Marked increase in marrow activity throughout the axillary and appendicular skeleton consistent with GCSF type response. 5. 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 This could reflect a  small amount of recurrent tumor in this vicinity. The patient also has scattered new hypermetabolic brown fat in the neck and upper chest, and alternative possibility is that this represents a small focus of misregistered hypermetabolic but benign brown fat adjacent to the sternum. Surveillance of this region is recommended. 2. Otherwise stable Deauville 3 activity in the bandlike residual density in the anterior mediastinum, and overall 2 activity in the small residual left axillary lymph nodes. No hypermetabolic activity in the abdomen. 3. Mature left ovarian dermoid. 4. Prior diffuse osseous activity has intervally resolved."  07/28/2019 CT C/A/P ([id number]) ([id number]) revealed "1. There has been interval decrease in thickness and fullness of anterior mediastinal and AP window soft tissue, measuring 3.7 x 1.2 cm, previously 4.0 x 1.5 cm when measured similarly (series 2, image 17). 2. There is new soft tissue along the inner table of the left aspect of the manubrium measuring 2.1 x 1.3 cm with subtle adjacent erosion and sclerosis of the bone (series 2, image 15). Adjacent anterior mediastinal lymph node appears newly enlarged compared to prior examination although was very difficult to discretely appreciate on prior due to soft tissue stranding, measuring approximately 1.3 x 1.0 cm (series 2, image 14). A left axillary lymph node is slightly enlarged and more full, measuring 1.6 x 0.9 cm, previously 1.4 x 0.7 cm (series 2, image 17). These findings are in keeping with abnormal FDG avidity identified on prior PET-CT and consistent with recurrent disease. 3. Remaining small axillary and subpectoral lymph nodes are unchanged. No abnormally enlarged lymph nodes in the abdomen or pelvis. 4. Large left ovarian dermoid."  2. Liver and splenic lesions concerning for involvement withCHL  3. S/p SVC compression due to anterior mediastinal mass - no upper  extremity , neck or face swelling or change in visiion to  suggest overt SVC syndrome  4. Microcytic anemia - normal iron. Likely anemia or chronic disease  5. B12 deficiency due to Pernicious anemia  PLAN:  -Discussed pt labwork, 08/30/19; all values are WNL. -Discussed 08/30/2019 [place] Axillary LN Bx ([id number]) which revealed "LYMPH NODE, LEFT AXILLARY, BIOPSY: Classical Hodgkin lymphoma." -Based on pt's LN Bx and latest CT C/A/P she has Relapsed Refractory Hodgkin's Lymphoma -Would like a PET/CT scan for baseline comparison - would rpt PET/CT scan after 2-3 cycles of treatment  -Advised pt that we would most likely use a multitherapy regimen: either out-patient GCD D1,8 every 21 days or in-patient ICE which requires hospitalization for 3 days -Recommend out-patient chemotherapy as it's generally tolerated better  -Would refer pt to transplant center for an autologous transplant -Discussed steps involved with an autologous BM transplant such as: BM stimulating agents, BM collecting/storage process and high-dose chemotherapy  -Advised pt that localized RT would be effective for palliative care but we are treating to cure -Pt had a PICC line with her first-line therapy, has never had a port -Recommended a port-a-cath placement as it will most likely be necessary for transplant -Will get PET/CT scan in 1 week  -Will begin GCD in 8 days   -Will refer to [facility] in 3 weeks  -Will get a port placement in 3-5 days -Will see back in 8 days with the beginning of treatment    FOLLOW UP: -PET/CT in 5 days for relapsed refractory hodgkins lymphoma -schedule to start Gemcitabine/Carboplatin/Dexamethasone in 8 days with labs and MD visit -port a cath placement by IR in 3-5 days   The total time spent in the appt was 40 minutes and more than 50% was on counseling and direct patient cares.  All of the patient's questions were answered with apparent satisfaction. The patient knows to call the clinic with any problems, questions or  concerns.   Sullivan Lone MD [facility] AAHIVMS [facility] Hematology/Oncology Physician [facility]  (Office):       [phone] (Work cell):  [phone] (Fax):           [phone]  09/07/2019 5:24 PM  I, Yevette Edwards, am acting as a scribe for Dr. Sullivan Lone.   .I have reviewed the above documentation for accuracy and completeness, and I agree with the above. Brunetta Genera MD

## 2019-09-08 ENCOUNTER — Telehealth: Payer: Self-pay | Admitting: Hematology

## 2019-09-08 NOTE — Telephone Encounter (Signed)
Scheduled appt per 11/11 los. ° °Left a VM of the appt date and time. °

## 2019-09-09 ENCOUNTER — Other Ambulatory Visit: Payer: Self-pay | Admitting: Hematology

## 2019-09-09 DIAGNOSIS — C8198 Hodgkin lymphoma, unspecified, lymph nodes of multiple sites: Secondary | ICD-10-CM

## 2019-09-09 DIAGNOSIS — Z7189 Other specified counseling: Secondary | ICD-10-CM

## 2019-09-09 DIAGNOSIS — I871 Compression of vein: Secondary | ICD-10-CM

## 2019-09-09 MED ORDER — PROCHLORPERAZINE MALEATE 10 MG PO TABS: 10.0000 mg | ORAL_TABLET | Freq: Four times a day (QID) | ORAL | 1 refills | Status: AC | PRN

## 2019-09-09 MED ORDER — LIDOCAINE-PRILOCAINE 2.5-2.5 % EX CREA: TOPICAL_CREAM | CUTANEOUS | 3 refills | Status: AC

## 2019-09-09 MED ORDER — LORAZEPAM 0.5 MG PO TABS: 0.5000 mg | ORAL_TABLET | Freq: Four times a day (QID) | ORAL | 0 refills | Status: AC | PRN

## 2019-09-09 MED ORDER — DEXAMETHASONE 4 MG PO TABS: ORAL_TABLET | ORAL | 3 refills | Status: AC

## 2019-09-09 MED ORDER — ONDANSETRON HCL 8 MG PO TABS: 8.0000 mg | ORAL_TABLET | Freq: Two times a day (BID) | ORAL | 1 refills | Status: AC | PRN

## 2019-09-09 MED FILL — LORazepam 0.5 MG TABS: 0.5 | 7 days supply | Qty: 30 | Fill #0

## 2019-09-09 MED FILL — ONDANSETRON HCL 8 MG TABLET: 8 | 15 days supply | Qty: 30 | Fill #0

## 2019-09-09 MED FILL — LIDOCAINE-PRILOCAINE CREAM: 2.5-2.5 | 30 days supply | Qty: 30 | Fill #0

## 2019-09-09 MED FILL — PROCHLORPERAZINE 10 MG TAB: 10 | 7 days supply | Qty: 30 | Fill #0

## 2019-09-09 MED FILL — DEXAMETHASONE 4 MG TABLET: 4 | 3 days supply | Qty: 30 | Fill #0

## 2019-09-09 NOTE — Progress Notes (Signed)
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     A cycle is every 28 days:     Doxorubicin      Dacarbazine      Vinblastine      Bleomycin   **Always confirm dose/schedule in your pharmacy ordering system**  REASON: Continuation Of Treatment PRIOR TREATMENT: [id number]: ABVD q28 Days x 2 Cycles Followed by PET-2 TREATMENT RESPONSE: Partial Response (PR)  START OFF PATHWAY REGIMEN - Lymphoma and CLL   [id number]:Carboplatin AUC=4 D1 + Gemcitabine 1,000 mg/m2 D1, 8 q21 Days:   A cycle is every 21 days:     Carboplatin      Gemcitabine   **Always confirm dose/schedule in your pharmacy ordering system**  Patient Characteristics: Classical Hodgkin Lymphoma, Second Line, Transplant Candidate Disease Type: Not Applicable Disease Type: Not Applicable Disease Type: Classical Hodgkin Lymphoma Line of therapy: Second Line [place] Stage: IVBE Patient Characteristics: Transplant Candidate Intent of Therapy: Curative Intent, Discussed with Patient

## 2019-09-09 NOTE — Progress Notes (Signed)
GCD chemotherapy ordered for relapsed/refractory hodgkins lymphoma placed.

## 2019-09-14 ENCOUNTER — Telehealth: Payer: Self-pay | Admitting: Hematology

## 2019-09-14 NOTE — Telephone Encounter (Signed)
Returned patient's phone call regarding cancelling 11/19 and 11/20 appointments, per 11/18 schedule message appointment has been cancelled. Left a voicemail for patient to reschedule.   Message to provider.

## 2019-09-15 ENCOUNTER — Inpatient Hospital Stay: Payer: Medicaid Other | Admitting: Hematology

## 2019-09-15 ENCOUNTER — Inpatient Hospital Stay: Payer: Medicaid Other

## 2019-09-16 ENCOUNTER — Ambulatory Visit: Payer: Medicaid Other

## 2019-09-20 ENCOUNTER — Telehealth: Payer: Self-pay | Admitting: *Deleted

## 2019-09-20 NOTE — Telephone Encounter (Signed)
Attempted to contact patient regarding appts cancelled for lab/Dr. Irene Limbo on 11/19 and infusion on 11/20. LVM asking patient to contact office for any questions or concerns or to reschedule appts.

## 2019-12-29 ENCOUNTER — Telehealth: Payer: Self-pay | Admitting: *Deleted

## 2019-12-29 ENCOUNTER — Encounter: Payer: Self-pay | Admitting: Hematology

## 2019-12-29 ENCOUNTER — Other Ambulatory Visit: Payer: Self-pay | Admitting: Hematology

## 2019-12-29 NOTE — Telephone Encounter (Signed)
Patient sent My Chart message:"My period has been on since February 7. It's almost time for my next period. Is there anything I can do about this?" Contacted patient - she reports moderate bleeding at this time, clots in beginning, not now. Recommended she contact her GYN, she said she does not have one. Dr. Irene Limbo informed.

## 2019-12-30 ENCOUNTER — Telehealth: Payer: Self-pay | Admitting: Hematology

## 2019-12-30 ENCOUNTER — Telehealth: Payer: Self-pay | Admitting: *Deleted

## 2019-12-30 NOTE — Telephone Encounter (Signed)
Contacted patient with Dr.Kale response - no answer - LVM on named voice mail with following information: She will need to use urgent care if no PCP or Gyn- for her menstrual issues. I can see her with 1st available f/u appt for her CHL. Schedule message will be sent to contact patient. Encouraged patient to contact office for questions.

## 2019-12-30 NOTE — Telephone Encounter (Signed)
Scheduled appt per 3/5 sch message - unable to reach pt left message with appt date and time

## 2020-01-09 ENCOUNTER — Inpatient Hospital Stay: Payer: Medicaid Other | Admitting: Hematology

## 2020-01-09 ENCOUNTER — Telehealth: Payer: Self-pay | Admitting: Hematology

## 2020-01-09 ENCOUNTER — Inpatient Hospital Stay: Payer: Medicaid Other

## 2020-01-09 NOTE — Telephone Encounter (Signed)
Cancelled todays appt per patient request- 3/15 sch msg.

## 2020-01-09 NOTE — Progress Notes (Incomplete)
? ? ?  HEMATOLOGY/ONCOLOGY CLINIC NOTE ? ?Date of Service: 01/09/2020 ? ?Patient Care Team: ?Patient, No Pcp Per as PCP - General (General Practice) ? ?CHIEF COMPLAINTS/PURPOSE OF CONSULTATION:  ?F/u for Mx of Hodgkins lymphoma ? ?HISTORY OF PRESENTING ILLNESS:  ? ?Cassandra Clark is a wonderful 39 y.o. female who has been referred to [place] by Dr. 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  ? ?Patient has a mild anemia and presented to the emergency room with new onset fevers chills and a 2-week history of worsening night sweats dry cough fatigue and anorexia. ? ?Patient notes that she initially started feeling unwell from late June when she had noted increasing fatigue that required her to rest for longer periods of time.  She also noted that she at times felt lightheaded and dizzy when she bent forward.  She notes a decreased appetite.  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 ?She notes that she has not been able to do much of anything else other than rest at home and work to some degree at her job. ? ?No chest pain no palpitations no diaphoresis no lower extremity edema.  No abdominal pain no nausea no vomiting no diarrhea.  No rectal bleeding. ?Notes that her periods have been regular. ? ?In the emergency room the patient was noted to have a fever of 102.4 ?F and tachycardia related to this. ? ?Blood counts showed that the patient was anemic with a hemoglobin around 8 with normal platelets and a WBC count of 3.6k.  Magnesium 1.9 and phosphorus of 2. ?She was also noted to be vitamin B12 deficient. ? ? Most recent lab results (08/04/18) of CBC w/diff and CMP is as follows: all values are WNL except for WBC at 3.0k RBC at 3.64, HGB at 8.3, HCT at 82.7, MCV at 78.8, MCH at 22.8, MCHC at 28.9, RDW at 18.1, PLT at 459k, Lymphs abs at 300, Calcium at 8.0, Albumin at 2.7. ?08/04/18 LDH at 203 ?08/04/18

## 2022-05-19 ENCOUNTER — Other Ambulatory Visit: Payer: Self-pay
